# Patient Record
Sex: Male | Born: 1960 | Race: Black or African American | Hispanic: No | Marital: Single | Smoking: Never smoker
Health system: Southern US, Community
[De-identification: ages and names within clinical notes are randomized; demographics above are authoritative.]

## PROBLEM LIST (undated history)

## (undated) DIAGNOSIS — C801 Malignant (primary) neoplasm, unspecified: Secondary | ICD-10-CM

## (undated) DIAGNOSIS — R51 Headache: Secondary | ICD-10-CM

## (undated) DIAGNOSIS — K649 Unspecified hemorrhoids: Secondary | ICD-10-CM

## (undated) DIAGNOSIS — J329 Chronic sinusitis, unspecified: Secondary | ICD-10-CM

## (undated) DIAGNOSIS — F528 Other sexual dysfunction not due to a substance or known physiological condition: Secondary | ICD-10-CM

## (undated) DIAGNOSIS — G43909 Migraine, unspecified, not intractable, without status migrainosus: Secondary | ICD-10-CM

## (undated) DIAGNOSIS — M5430 Sciatica, unspecified side: Secondary | ICD-10-CM

## (undated) DIAGNOSIS — J45909 Unspecified asthma, uncomplicated: Secondary | ICD-10-CM

## (undated) DIAGNOSIS — J309 Allergic rhinitis, unspecified: Secondary | ICD-10-CM

## (undated) DIAGNOSIS — I10 Essential (primary) hypertension: Secondary | ICD-10-CM

## (undated) DIAGNOSIS — N410 Acute prostatitis: Secondary | ICD-10-CM

## (undated) DIAGNOSIS — E785 Hyperlipidemia, unspecified: Secondary | ICD-10-CM

## (undated) HISTORY — PX: WISDOM TOOTH EXTRACTION: SHX21

## (undated) HISTORY — DX: Allergic rhinitis, unspecified: J30.9

## (undated) HISTORY — DX: Migraine, unspecified, not intractable, without status migrainosus: G43.909

## (undated) HISTORY — DX: Other sexual dysfunction not due to a substance or known physiological condition: F52.8

## (undated) HISTORY — DX: Malignant (primary) neoplasm, unspecified: C80.1

## (undated) HISTORY — PX: COLONOSCOPY: SHX174

## (undated) HISTORY — DX: Hyperlipidemia, unspecified: E78.5

## (undated) HISTORY — DX: Unspecified hemorrhoids: K64.9

## (undated) HISTORY — DX: Chronic sinusitis, unspecified: J32.9

## (undated) HISTORY — DX: Unspecified asthma, uncomplicated: J45.909

## (undated) HISTORY — DX: Headache: R51

## (undated) HISTORY — DX: Sciatica, unspecified side: M54.30

## (undated) HISTORY — DX: Essential (primary) hypertension: I10

## (undated) HISTORY — DX: Acute prostatitis: N41.0

---

## 2004-10-16 ENCOUNTER — Ambulatory Visit: Payer: Self-pay | Admitting: Internal Medicine

## 2004-12-20 ENCOUNTER — Ambulatory Visit: Payer: Self-pay | Admitting: Internal Medicine

## 2005-02-08 ENCOUNTER — Ambulatory Visit: Payer: Self-pay | Admitting: Internal Medicine

## 2005-05-06 ENCOUNTER — Ambulatory Visit: Payer: Self-pay | Admitting: Internal Medicine

## 2005-05-10 ENCOUNTER — Ambulatory Visit: Payer: Self-pay | Admitting: Internal Medicine

## 2005-06-24 ENCOUNTER — Ambulatory Visit: Payer: Self-pay | Admitting: Internal Medicine

## 2005-07-02 ENCOUNTER — Ambulatory Visit: Payer: Self-pay | Admitting: Internal Medicine

## 2005-09-04 ENCOUNTER — Ambulatory Visit: Payer: Self-pay | Admitting: Internal Medicine

## 2005-09-26 ENCOUNTER — Ambulatory Visit: Payer: Self-pay | Admitting: Internal Medicine

## 2005-12-04 ENCOUNTER — Ambulatory Visit: Payer: Self-pay | Admitting: Internal Medicine

## 2006-05-02 ENCOUNTER — Ambulatory Visit: Payer: Self-pay | Admitting: Internal Medicine

## 2006-05-23 ENCOUNTER — Ambulatory Visit: Payer: Self-pay | Admitting: Internal Medicine

## 2006-05-23 LAB — CONVERTED CEMR LAB
AST: 31 units/L (ref 0–37)
Albumin: 3.7 g/dL (ref 3.5–5.2)
Alkaline Phosphatase: 48 units/L (ref 39–117)
BUN: 10 mg/dL (ref 6–23)
Basophils Absolute: 0 10*3/uL (ref 0.0–0.1)
Eosinophil percent: 2.1 % (ref 0.0–5.0)
GFR calc non Af Amer: 77 mL/min
HCT: 46.3 % (ref 39.0–52.0)
Hemoglobin: 16.1 g/dL (ref 13.0–17.0)
LDL DIRECT: 192.7 mg/dL
Monocytes Relative: 10.7 % (ref 3.0–11.0)
Neutrophils Relative %: 59.9 % (ref 43.0–77.0)
PSA: 0.47 ng/mL (ref 0.10–4.00)
Potassium: 3.9 meq/L (ref 3.5–5.1)
RDW: 12.8 % (ref 11.5–14.6)
Total Bilirubin: 1.1 mg/dL (ref 0.3–1.2)
Total Protein: 6.8 g/dL (ref 6.0–8.3)

## 2006-05-30 ENCOUNTER — Ambulatory Visit: Payer: Self-pay | Admitting: Internal Medicine

## 2006-07-07 ENCOUNTER — Ambulatory Visit: Payer: Self-pay | Admitting: Internal Medicine

## 2006-09-12 ENCOUNTER — Ambulatory Visit: Payer: Self-pay | Admitting: Internal Medicine

## 2006-09-22 ENCOUNTER — Ambulatory Visit: Payer: Self-pay | Admitting: Internal Medicine

## 2007-02-26 ENCOUNTER — Ambulatory Visit: Payer: Self-pay | Admitting: Internal Medicine

## 2007-02-26 DIAGNOSIS — J019 Acute sinusitis, unspecified: Secondary | ICD-10-CM | POA: Insufficient documentation

## 2007-02-26 DIAGNOSIS — E785 Hyperlipidemia, unspecified: Secondary | ICD-10-CM

## 2007-02-26 DIAGNOSIS — E782 Mixed hyperlipidemia: Secondary | ICD-10-CM | POA: Insufficient documentation

## 2007-02-26 DIAGNOSIS — J309 Allergic rhinitis, unspecified: Secondary | ICD-10-CM | POA: Insufficient documentation

## 2007-02-26 HISTORY — DX: Hyperlipidemia, unspecified: E78.5

## 2007-02-26 HISTORY — DX: Allergic rhinitis, unspecified: J30.9

## 2007-02-26 LAB — CONVERTED CEMR LAB: HDL goal, serum: 40 mg/dL

## 2007-04-09 ENCOUNTER — Telehealth: Payer: Self-pay | Admitting: Internal Medicine

## 2007-08-10 ENCOUNTER — Telehealth: Payer: Self-pay | Admitting: Internal Medicine

## 2007-09-21 ENCOUNTER — Telehealth: Payer: Self-pay | Admitting: Internal Medicine

## 2007-12-02 ENCOUNTER — Telehealth: Payer: Self-pay | Admitting: Internal Medicine

## 2007-12-02 ENCOUNTER — Ambulatory Visit: Payer: Self-pay | Admitting: Internal Medicine

## 2007-12-02 DIAGNOSIS — N410 Acute prostatitis: Secondary | ICD-10-CM

## 2007-12-02 DIAGNOSIS — R319 Hematuria, unspecified: Secondary | ICD-10-CM | POA: Insufficient documentation

## 2007-12-02 HISTORY — DX: Acute prostatitis: N41.0

## 2007-12-09 ENCOUNTER — Ambulatory Visit: Payer: Self-pay | Admitting: Internal Medicine

## 2008-03-01 ENCOUNTER — Telehealth: Payer: Self-pay | Admitting: Internal Medicine

## 2008-04-05 ENCOUNTER — Telehealth: Payer: Self-pay | Admitting: Internal Medicine

## 2008-04-06 ENCOUNTER — Ambulatory Visit: Payer: Self-pay | Admitting: Internal Medicine

## 2008-05-02 ENCOUNTER — Telehealth: Payer: Self-pay | Admitting: Internal Medicine

## 2008-05-04 ENCOUNTER — Ambulatory Visit: Payer: Self-pay | Admitting: Internal Medicine

## 2008-05-04 DIAGNOSIS — M5430 Sciatica, unspecified side: Secondary | ICD-10-CM

## 2008-05-04 HISTORY — DX: Sciatica, unspecified side: M54.30

## 2008-06-29 ENCOUNTER — Ambulatory Visit: Payer: Self-pay | Admitting: Internal Medicine

## 2008-06-29 LAB — CONVERTED CEMR LAB
Bilirubin Urine: NEGATIVE
Urobilinogen, UA: 1

## 2008-06-30 ENCOUNTER — Encounter: Payer: Self-pay | Admitting: Internal Medicine

## 2008-08-10 ENCOUNTER — Ambulatory Visit: Payer: Self-pay | Admitting: Internal Medicine

## 2008-08-10 DIAGNOSIS — H00019 Hordeolum externum unspecified eye, unspecified eyelid: Secondary | ICD-10-CM | POA: Insufficient documentation

## 2008-09-21 ENCOUNTER — Telehealth: Payer: Self-pay | Admitting: Internal Medicine

## 2008-09-29 ENCOUNTER — Telehealth: Payer: Self-pay | Admitting: Internal Medicine

## 2008-11-07 ENCOUNTER — Telehealth: Payer: Self-pay | Admitting: Internal Medicine

## 2008-11-08 ENCOUNTER — Ambulatory Visit: Payer: Self-pay | Admitting: Internal Medicine

## 2008-11-08 DIAGNOSIS — F528 Other sexual dysfunction not due to a substance or known physiological condition: Secondary | ICD-10-CM | POA: Insufficient documentation

## 2008-11-08 DIAGNOSIS — N529 Male erectile dysfunction, unspecified: Secondary | ICD-10-CM | POA: Insufficient documentation

## 2008-11-08 HISTORY — DX: Other sexual dysfunction not due to a substance or known physiological condition: F52.8

## 2009-02-21 ENCOUNTER — Telehealth: Payer: Self-pay | Admitting: Internal Medicine

## 2009-02-22 ENCOUNTER — Ambulatory Visit: Payer: Self-pay | Admitting: Internal Medicine

## 2009-06-28 ENCOUNTER — Ambulatory Visit: Payer: Self-pay | Admitting: Internal Medicine

## 2009-06-28 DIAGNOSIS — J0110 Acute frontal sinusitis, unspecified: Secondary | ICD-10-CM | POA: Insufficient documentation

## 2009-08-01 ENCOUNTER — Ambulatory Visit: Payer: Self-pay | Admitting: Internal Medicine

## 2010-01-31 ENCOUNTER — Ambulatory Visit: Payer: Self-pay | Admitting: Family Medicine

## 2010-02-28 ENCOUNTER — Ambulatory Visit: Payer: Self-pay | Admitting: Family Medicine

## 2010-02-28 DIAGNOSIS — J329 Chronic sinusitis, unspecified: Secondary | ICD-10-CM

## 2010-02-28 HISTORY — DX: Chronic sinusitis, unspecified: J32.9

## 2010-03-15 ENCOUNTER — Ambulatory Visit: Payer: Self-pay | Admitting: Cardiovascular Disease

## 2010-06-26 NOTE — Assessment & Plan Note (Signed)
Summary: ?sinus infection/headaches/cjr   Vital Signs:  Patient profile:   50 year old male Height:      75 inches Weight:      227 pounds BMI:     28.48 Temp:     98.0 degrees F oral Pulse rate:   72 / minute Resp:     14 per minute BP sitting:   110 / 70  (left arm)  Vitals Entered By: Willy Eddy, LPN (June 28, 2009 11:57 AM) CC: c/o sinusitis like sx, URI symptoms   CC:  c/o sinusitis like sx and URI symptoms.  History of Present Illness:  URI Symptoms      This is a 50 year old man who presents with URI symptoms.  The patient reports purulent nasal discharge and dry cough, but denies nasal congestion, sore throat, productive cough, earache, and sick contacts.  Associated symptoms include low-grade fever (<100.5 degrees).  The patient denies fever, fever of 100.5-103 degrees, fever of 103.1-104 degrees, fever to >104 degrees, stiff neck, dyspnea, wheezing, rash, vomiting, diarrhea, use of an antipyretic, and response to antipyretic.  The patient also reports headache, muscle aches, and severe fatigue.  The patient denies the following risk factors for Strep sinusitis: unilateral facial pain, unilateral nasal discharge, poor response to decongestant, double sickening, tooth pain, Strep exposure, tender adenopathy, and absence of cough.    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 Once Daily 2)  Zyrtec Allergy  10 Mg  Tabs (Cetirizine Hcl) .... Once Daily 3)  Nasonex 50 Mcg/act  Susp (Mometasone Furoate) .... Use As Directed 4)  Etodolac 300 Mg  Caps (Etodolac) .... Two Times A Day With Food 5)  Viagra 50 Mg Tabs (Sildenafil Citrate) .... Take As Directed 6)  Levaquin 750 Mg Tabs (Levofloxacin) .... One By Mouth Dialy For 7 Days  Current Medications (verified): 1)  Crestor 10 Mg  Tabs (Rosuvastatin Calcium) .... Once Daily 2)  Viagra 50 Mg Tabs (Sildenafil Citrate) .... Take As Directed 3)  Sulfamethoxazole-Tmp Ds 800-160 Mg Tabs (Sulfamethoxazole-Trimethoprim) .... One By Mouth  Two Times A Day 4)  Allerx-D 120-2.5 Mg Xr12h-Tab (Pseudoephedrine-Methscopolamin) .... One By Mouth Two Times A Day  Allergies (verified): No Known Drug Allergies  Past History:  Family History: Last updated: 12/02/2007 Family History Hypertension  Social History: Last updated: 02/26/2007 Married Never Smoked  Risk Factors: Smoking Status: never (06/28/2009)  Past medical, surgical, family and social histories (including risk factors) reviewed for relevance to current acute and chronic problems.  Past Medical History: Reviewed history from 02/26/2007 and no changes required. Allergic rhinitis Hyperlipidemia  Family History: Reviewed history from 12/02/2007 and no changes required. Family History Hypertension  Social History: Reviewed history from 02/26/2007 and no changes required. Married Never Smoked  Review of Systems       The patient complains of decreased hearing, hoarseness, chest pain, and prolonged cough.  The patient denies anorexia, fever, weight loss, weight gain, vision loss,  syncope, dyspnea on exertion, peripheral edema, headaches, hemoptysis, abdominal pain, melena, hematochezia, severe indigestion/heartburn, hematuria, incontinence, genital sores, muscle weakness, suspicious skin lesions, transient blindness, difficulty walking, depression, unusual weight change, abnormal bleeding,  enlarged lymph nodes, angioedema, breast masses, and testicular masses.    Physical Exam  General:  Well-developed,well-nourished,in no acute distress; alert,appropriate and cooperative throughout examination Ears:  R ear normal and L ear normal.   Nose:  L frontal sinus tenderness and L maxillary sinus tenderness.   Mouth:  pharyngeal exudate, posterior lymphoid hypertrophy, and postnasal drip.   Neck:  No deformities, masses, or tenderness noted. Lungs:  Normal respiratory effort, chest expands symmetrically. Lungs are clear to auscultation, no crackles or wheezes. Heart:  Normal rate and regular rhythm. S1 and S2 normal without gallop, murmur, click, rub or other extra sounds.   Impression & Recommendations:  Problem # 1:  ACUTE FRONTAL SINUSITIS (ICD-461.1)  The following medications were removed from the medication list:    Nasonex 50 Mcg/act Susp (Mometasone furoate) ..... Use as directed    Levaquin 750 Mg Tabs (Levofloxacin) ..... One by mouth dialy for 7 days His updated medication list for this problem includes:    Sulfamethoxazole-tmp Ds 800-160 Mg Tabs (Sulfamethoxazole-trimethoprim) ..... One by mouth  two times a day    Allerx-d 120-2.5 Mg Xr12h-tab (Pseudoephedrine-methscopolamin) ..... One by mouth two times a day  Instructed on treatment. Call if symptoms persist or worsen.   Complete Medication List: 1)  Crestor 10 Mg Tabs (Rosuvastatin calcium) .... Once daily 2)  Viagra 50 Mg Tabs (Sildenafil citrate) .... Take as directed 3)  Sulfamethoxazole-tmp Ds 800-160 Mg Tabs (Sulfamethoxazole-trimethoprim) .... One by mouth  two times a day 4)  Allerx-d 120-2.5 Mg Xr12h-tab (Pseudoephedrine-methscopolamin) .... One by mouth two times a day  Patient Instructions: 1)  Take your antibiotic as prescribed until ALL of it is gone, but stop if you develop a rash or swelling and contact our office as soon as possible. Prescriptions: ALLERX-D 120-2.5 MG XR12H-TAB  (PSEUDOEPHEDRINE-METHSCOPOLAMIN) one by mouth two times a day  #20 x 0   Entered and Authorized by:   Stacie Glaze MD   Signed by:   Stacie Glaze MD on 06/28/2009   Method used:   Electronically to        [facility] [phone]* (retail)       [address] [place], [place]  [address]       Ph: [phone] or [phone]       Fax: [phone]   RxID:   [id number] SULFAMETHOXAZOLE-TMP DS 800-160 MG TABS (SULFAMETHOXAZOLE-TRIMETHOPRIM) one by mouth  two times a day  #20 x 0   Entered and Authorized by:   Stacie Glaze MD   Signed by:   Stacie Glaze MD on 06/28/2009   Method used:   Electronically to        [facility] [phone]* (retail)       [address] [place], [place]  [address]       Ph: [phone] or [phone]       Fax: [phone]   RxID:   [id number]

## 2010-06-26 NOTE — Assessment & Plan Note (Signed)
Summary: CHRONIC SINUS PROBLEMS // RS   Vital Signs:  Patient profile:   50 year old male Height:      75 inches (190.50 cm) Weight:      234.31 pounds (106.50 kg) O2 Sat:      95 % on Room air Temp:     98.1 degrees F (36.72 degrees C) oral Pulse rate:   78 / minute BP sitting:   136 / 90  (left arm) Cuff size:   large  Vitals Entered By: Josph Macho RMA (February 28, 2010 3:38 PM)  O2 Flow:  Room air CC: Chronic sinus problems/ CF Is Patient Diabetic? No   History of Present Illness: patient is in today with recurrent sinusitis. He was seen in September for sinusitis placed on ciprofloxacin notes he got roughly 80% better on the antibiotic but he stopped the symptoms are worsening again he is dealing with increased nasal congestion green rhinorrhea, increased facial pressure and ear pressure. On review of his chart and in speaking to him he has roughly 2 intense sinus infections annually. As well as ongoing allergies. He has been pleased with the fluticasone does feel like that helped to a degree and takes his cetirizine daily at this point but still is struggling with recurrent sinus infection. He is complaining of persistent facial pressure and pressure especially behind his eyes left greater than right. No visual changes, no photophobia, no high-grade fevers or chills no chest pain, palpitations, shortness of breath, GI or GU complaints noted to  Current Medications (verified): 1)  Crestor 10 Mg  Tabs (Rosuvastatin Calcium) .... Once Daily 2)  Viagra 50 Mg Tabs (Sildenafil Citrate) .... Take As Directed 3)  Chlor-Trimeton 4 Mg Tabs (Chlorpheniramine Maleate) .... One By Mouth Two Times A Day 4)  Cipro 500 Mg Tabs (Ciprofloxacin Hcl) .Marland Kitchen.. 1 Tab By Mouth Two Times A Day X 14 Days 5)  Cetirizine Hcl 10 Mg Tabs (Cetirizine Hcl) .Marland Kitchen.. 1 Tab By Mouth Daily By Mouth Daily During Allergy Season 6)  Fluticasone Propionate 50 Mcg/act Susp (Fluticasone Propionate) .... 2 Sprays Each Nostril  Daily During Allergy Season 7)  Naprosyn 500 Mg Tabs (Naproxen) .Marland Kitchen.. 1 Tab By Mouth Two Times A Day As Needed Pain With Food  Allergies (verified): No Known Drug Allergies  Past History:  Past medical history reviewed for relevance to current acute and chronic problems. Social history (including risk factors) reviewed for relevance to current acute and chronic problems.  Past Medical History: Reviewed history from 02/26/2007 and no changes required. Allergic rhinitis Hyperlipidemia  Social History: Reviewed history from 02/26/2007 and no changes required. Married Never Smoked  Review of Systems      See HPI  Physical Exam  General:  Well-developed,well-nourished,in no acute distress; alert,appropriate and cooperative throughout examination Head:  Normocephalic and atraumatic without obvious abnormalities. No apparent alopecia or balding. Eyes:  No corneal or conjunctival inflammation noted. EOMI.  Nose:  mucosal erythema, mucosal edema, and deviated septum slight to right Mouth:  Oral mucosa and oropharynx without lesions or exudates.  Teeth in good repair. oropharynx mildly erythematous Neck:  No deformities, masses, or tenderness noted. Lungs:  Normal respiratory effort, chest expands symmetrically. Lungs are clear to auscultation, no crackles or wheezes. Heart:  Normal rate and regular rhythm. S1 and S2 normal without gallop, murmur, click, rub or other extra sounds. Abdomen:  Bowel sounds positive,abdomen soft and non-tender without masses, organomegaly or hernias noted. Extremities:  No clubbing, cyanosis, edema, or deformity noted  with normal full range of motion of all joints.   Cervical Nodes:  R anterior LN enlarged and L anterior LN enlarged.   Psych:  Cognition and judgment appear intact. Alert and cooperative with normal attention span and concentration. No apparent delusions, illusions, hallucinations   Impression & Recommendations:  Problem # 1:  SINUSITIS,  RECURRENT (ICD-473.9)  His updated medication list for this problem includes:    Cipro 500 Mg Tabs (Ciprofloxacin hcl) .Marland Kitchen... 1 tab by mouth two times a day x 14 days    Fluticasone Propionate 50 Mcg/act Susp (Fluticasone propionate) .Marland Kitchen... 2 sprays each nostril daily during allergy season    Cefdinir 300 Mg Caps (Cefdinir) .Marland Kitchen... 1 cap by mouth two times a day x 21 days At this point these are happening more frequently so will refer for CT sinuses and ENT evaluation, appears to have a deviated septum  Problem # 2:  ALLERGIC RHINITIS (ICD-477.9)  His updated medication list for this problem includes:    Chlor-trimeton 4 Mg Tabs (Chlorpheniramine maleate) ..... One by mouth two times a day    Cetirizine Hcl 10 Mg Tabs (Cetirizine hcl) .Marland Kitchen... 1 tab by mouth daily by mouth daily during allergy season    Fluticasone Propionate 50 Mcg/act Susp (Fluticasone propionate) .Marland Kitchen... 2 sprays each nostril daily during allergy season  Orders: ENT Referral (ENT) Take meds daily until seen by ENT  Complete Medication List: 1)  Crestor 10 Mg Tabs (Rosuvastatin calcium) .... Once daily 2)  Viagra 50 Mg Tabs (Sildenafil citrate) .... Take as directed, 1 tab by mouth once daily as needed ed 3)  Chlor-trimeton 4 Mg Tabs (Chlorpheniramine maleate) .... One by mouth two times a day 4)  Cipro 500 Mg Tabs (Ciprofloxacin hcl) .Marland Kitchen.. 1 tab by mouth two times a day x 14 days 5)  Cetirizine Hcl 10 Mg Tabs (Cetirizine hcl) .Marland Kitchen.. 1 tab by mouth daily by mouth daily during allergy season 6)  Fluticasone Propionate 50 Mcg/act Susp (Fluticasone propionate) .... 2 sprays each nostril daily during allergy season 7)  Naprosyn 500 Mg Tabs (Naproxen) .Marland Kitchen.. 1 tab by mouth two times a day as needed pain with food 8)  Cefdinir 300 Mg Caps (Cefdinir) .Marland Kitchen.. 1 cap by mouth two times a day x 21 days 9)  Align Caps (Probiotic product) .Marland Kitchen.. 1 cap by mouth daily 10)  Medrol (pak) 4 Mg Tabs (Methylprednisolone) .... Use pack by mouth as directed  on package  Other Orders: Radiology Referral (Radiology)  Patient Instructions: 1)  Take your antibiotic as prescribed until ALL of it is gone, but stop if you develop a rash or swelling and contact our office as soon as possible.  2)  Acute sinusitis symptoms for less than 10 days are not helped by antibiotics. Use warm moist compresses, and over the counter decongestants( only as directed). Call if no improvement in 5-7 days, sooner if increasing pain, fever, or new symptoms.  3)  Take a probiotic and eat a yogurt daily whenever you take an antibiotic Prescriptions: VIAGRA 50 MG TABS (SILDENAFIL CITRATE) Take as directed, 1 tab by mouth once daily as needed ED  #9 x 1   Entered and Authorized by:   Danise Edge MD   Signed by:   Danise Edge MD on 02/28/2010   Method used:   Print then Give to Patient   RxID:   [id number] MEDROL (PAK) 4 MG TABS (METHYLPREDNISOLONE) use pack by mouth as directed on package  #1 pack x 0  Entered and Authorized by:   Danise Edge MD   Signed by:   Danise Edge MD on 02/28/2010   Method used:   Electronically to        [facility] [phone]* (retail)       [address] [place], [place]  [address]       Ph: [phone] or [phone]       Fax: [phone]   RxID:   [id number] CEFDINIR 300 MG CAPS (CEFDINIR) 1 cap by mouth two times a day x 21 days  #42 x 0   Entered and Authorized by:   Danise Edge MD   Signed by:   Danise Edge MD on 02/28/2010   Method used:   Electronically to        [facility] [phone]* (retail)       [address] [place], [place]  [address]       Ph: [phone] or [phone]       Fax: [phone]   RxID:   [id number]

## 2010-06-26 NOTE — Assessment & Plan Note (Signed)
Summary: ?sinus inf/njr   Vital Signs:  Patient profile:   50 year old male Height:      75 inches (190.50 cm) Weight:      233 pounds (105.91 kg) O2 Sat:      98 % on Room air Temp:     97.8 degrees F (36.56 degrees C) oral Pulse rate:   88 / minute BP sitting:   124 / 90  (left arm) Cuff size:   large  Vitals Entered By: Josph Macho RMA (January 31, 2010 2:59 PM)  O2 Flow:  Room air CC: Possible sinus infection- headaches across eyes, drainage in back of throat X2 days/ CF Is Patient Diabetic? No   History of Present Illness: Patient in today for evaluation of recurrent sinusitis. He has had frontal HA/nasal congestion/watery and itchy eyes. He has also noted some mild nausea and one episode of vomitting. He denies any f/c/myalgias/diarrhea/constipation/anorexia/CP/palp/SOB/cough. He has been taking some Excedrine as needed for the HA and it helps temporarily. He is not experiencing rhinorrhea but does note some PND. The one time he vomitted he felt it was mostly sputum and it did have some small, blood flecks. He denies any heartburn or dyspepsia. Looking through his chart it appears he gets frequent sinus infections in the fall and spring. He acknowledges he feels his allergies flare during those seasons and then he picks up a sinus infection.  Current Medications (verified): 1)  Crestor 10 Mg  Tabs (Rosuvastatin Calcium) .... Once Daily 2)  Viagra 50 Mg Tabs (Sildenafil Citrate) .... Take As Directed 3)  Clarithromycin 500 Mg Xr24h-Tab (Clarithromycin) .... One By Mouth Two Times A Day 4)  Chlor-Trimeton 4 Mg Tabs (Chlorpheniramine Maleate) .... One By Mouth Two Times A Day  Allergies (verified): No Known Drug Allergies  Past History:  Past medical history reviewed for relevance to current acute and chronic problems. Social history (including risk factors) reviewed for relevance to current acute and chronic problems.  Past Medical History: Reviewed history from  02/26/2007 and no changes required. Allergic rhinitis Hyperlipidemia  Social History: Reviewed history from 02/26/2007 and no changes required. Married Never Smoked  Review of Systems      See HPI  Physical Exam  General:  Well-developed,well-nourished,in no acute distress; alert,appropriate and cooperative throughout examination Head:  Normocephalic and atraumatic without obvious abnormalities. No apparent alopecia or balding. Eyes:  conjunctival injection.   Ears:  L ear normal and R TM erythema, mild Nose:  nasal mucosa boggy and erythematous b/l. Right nares nearly occluded Mouth:  Oropharynx erythematous, no edema. MMM Neck:  No deformities, masses, or tenderness noted. Lungs:  Normal respiratory effort, chest expands symmetrically. Lungs are clear to auscultation, no crackles or wheezes. Heart:  Normal rate and regular rhythm. S1 and S2 normal without gallop, murmur, click, rub or other extra sounds. Abdomen:  Bowel sounds positive,abdomen soft and non-tender without masses, organomegaly or hernias noted. Extremities:  No clubbing, cyanosis, edema, or deformity noted  Psych:  Cognition and judgment appear intact. Alert and cooperative with normal attention span and concentration. No apparent delusions, illusions, hallucinations   Impression & Recommendations:  Problem # 1:  ACUTE FRONTAL SINUSITIS (ICD-461.1)  The following medications were removed from the medication list:    Clarithromycin 500 Mg Xr24h-tab (Clarithromycin) ..... One by mouth two times a day His updated medication list for this problem includes:    Cipro 500 Mg Tabs (Ciprofloxacin hcl) .Marland Kitchen... 1 tab by mouth two times a day x  14 days    Fluticasone Propionate 50 Mcg/act Susp (Fluticasone propionate) .Marland Kitchen... 2 sprays each nostril daily during allergy season, given sample os Nasonex to try first. Will stay on which ever one he finds most helpful. Recommend to take a nasal steroid and an antihistamine daily during  the early spring and fall each year to see if he can decrease his episodes of sinusitis. If this is ineffective he may need a referral to ENT for further evaluation  Problem # 2:  ALLERGIC RHINITIS (ICD-477.9)  His updated medication list for this problem includes:    Chlor-trimeton 4 Mg Tabs (Chlorpheniramine maleate) ..... One by mouth two times a day    Cetirizine Hcl 10 Mg Tabs (Cetirizine hcl) .Marland Kitchen... 1 tab by mouth daily by mouth daily during allergy season    Fluticasone Propionate 50 Mcg/act Susp (Fluticasone propionate) .Marland Kitchen... 2 sprays each nostril daily during allergy season  Complete Medication List: 1)  Crestor 10 Mg Tabs (Rosuvastatin calcium) .... Once daily 2)  Viagra 50 Mg Tabs (Sildenafil citrate) .... Take as directed 3)  Chlor-trimeton 4 Mg Tabs (Chlorpheniramine maleate) .... One by mouth two times a day 4)  Cipro 500 Mg Tabs (Ciprofloxacin hcl) .Marland Kitchen.. 1 tab by mouth two times a day x 14 days 5)  Cetirizine Hcl 10 Mg Tabs (Cetirizine hcl) .Marland Kitchen.. 1 tab by mouth daily by mouth daily during allergy season 6)  Fluticasone Propionate 50 Mcg/act Susp (Fluticasone propionate) .... 2 sprays each nostril daily during allergy season 7)  Naprosyn 500 Mg Tabs (Naproxen) .Marland Kitchen.. 1 tab by mouth two times a day as needed pain with food  Patient Instructions: 1)  Take your antibiotic as prescribed until ALL of it is gone, but stop if you develop a rash or swelling and contact our office as soon as possible.  2)  Acute sinusitis symptoms for less than 10 days are not helped by antibiotics. Use warm moist compresses, and over the counter decongestants( only as directed). Call if no improvement in 5-7 days, sooner if increasing pain, fever, or new symptoms.  3)  Please schedule a follow-up appointment as needed if symptoms worsen or do not improve 4)  Use sample os Nasonex 2 sprays each nostril daily til gone is helpful p/u fluticasone and use 2 sprays daily you will stay on which ever one you like  better daily during allergy season. If you like the Nasonex better we can call in an rx Prescriptions: NAPROSYN 500 MG TABS (NAPROXEN) 1 tab by mouth two times a day as needed pain with food  #60 x 3   Entered and Authorized by:   Danise Edge MD   Signed by:   Danise Edge MD on 01/31/2010   Method used:   Electronically to        [facility] [phone]* (retail)       [address] [place], [place]  [address]       Ph: [phone] or [phone]       Fax: [phone]   RxID:   [id number] FLUTICASONE PROPIONATE 50 MCG/ACT SUSP (FLUTICASONE PROPIONATE) 2 sprays each nostril daily during allergy season  #1bottle x 3   Entered and Authorized by:   Danise Edge MD   Signed by:   Danise Edge MD on 01/31/2010   Method used:   Electronically to        [facility] [phone]* (retail)  [address] [place], [place]  [address]       Ph: [phone] or [phone]       Fax: [phone]   RxID:   [id number] CETIRIZINE HCL 10 MG TABS (CETIRIZINE HCL) 1 tab by mouth daily by mouth daily during allergy season  #30 x 3   Entered and Authorized by:   Danise Edge MD   Signed by:   Danise Edge MD on 01/31/2010   Method used:   Electronically to        [facility] [phone]* (retail)       [address] [place], [place]  [address]       Ph: [phone] or [phone]       Fax: [phone]   RxID:   [id number] CIPRO 500 MG TABS (CIPROFLOXACIN HCL) 1 tab by mouth two times a day x 14 days  #28 x 0   Entered and Authorized by:   Danise Edge MD   Signed by:   Danise Edge MD on 01/31/2010   Method used:   Electronically to        [facility] [phone]* (retail)       [address] [place], [place]  [address]       Ph: [phone] or [phone]       Fax: [phone]   RxID:   [id number]

## 2010-06-26 NOTE — Assessment & Plan Note (Signed)
Summary: sore throat/dm   Vital Signs:  Patient profile:   50 year old male Height:      75 inches Weight:      228 pounds BMI:     28.60 Temp:     98.2 degrees F oral Pulse rate:   76 / minute Resp:     14 per minute BP sitting:   120 / 80  (left arm)  Vitals Entered By: Willy Eddy, LPN (August 01, 1608 11:45 AM) CC: c/o sore throat,nasal congestion and cough for 4 days--started taking bactrim ds that he had   CC:  c/o sore throat and nasal congestion and cough for 4 days--started taking bactrim ds that he had.  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 Once Daily 2)  Viagra 50 Mg Tabs (Sildenafil Citrate) .... Take As Directed 3)  Sulfamethoxazole-Tmp Ds 800-160 Mg Tabs (Sulfamethoxazole-Trimethoprim) .... One By Mouth  Two Times A Day 4)  Allerx-D 120-2.5 Mg Xr12h-Tab (Pseudoephedrine-Methscopolamin) .... One By Mouth Two Times A Day  Current Medications (verified): 1)  Crestor 10 Mg  Tabs (Rosuvastatin Calcium) .... Once Daily 2)  Viagra 50 Mg Tabs  (Sildenafil Citrate) .... Take As Directed 3)  Sulfamethoxazole-Tmp Ds 800-160 Mg Tabs (Sulfamethoxazole-Trimethoprim) .... One By Mouth  Two Times A Day 4)  Allerx-D 120-2.5 Mg Xr12h-Tab (Pseudoephedrine-Methscopolamin) .... One By Mouth Two Times A Day  Allergies (verified): No Known Drug Allergies  Past History:  Family History: Last updated: 12/02/2007 Family History Hypertension  Social History: Last updated: 02/26/2007 Married Never Smoked  Risk Factors: Smoking Status: never (08/01/2009)  Past medical, surgical, family and social histories (including risk factors) reviewed, and no changes noted (except as noted below).  Past Medical History: Reviewed history from 02/26/2007 and no changes required. Allergic rhinitis Hyperlipidemia  Family History: Reviewed history from 12/02/2007 and no changes required. Family History Hypertension  Social History: Reviewed history from 02/26/2007 and no changes required. Married Never Smoked  Review of Systems  The patient denies anorexia, fever, weight loss, weight gain, vision loss, decreased hearing, hoarseness, chest pain, syncope, dyspnea on exertion, peripheral edema, prolonged cough, headaches, hemoptysis, abdominal pain, melena, hematochezia, severe indigestion/heartburn, hematuria, incontinence, genital sores, muscle weakness, suspicious skin lesions, transient blindness, difficulty walking, depression, unusual weight change, abnormal bleeding, enlarged lymph nodes, angioedema, and breast masses.    Physical Exam  General:  Well-developed,well-nourished,in no acute distress; alert,appropriate and cooperative throughout examination Ears:  R ear normal and L ear normal.   Nose:  L frontal sinus tenderness and L maxillary sinus tenderness.   Mouth:  pharyngeal exudate, posterior lymphoid hypertrophy, and postnasal drip.   Neck:  No deformities, masses, or tenderness noted. Lungs:  Normal respiratory effort, chest expands  symmetrically. Lungs are clear to auscultation, no crackles or wheezes. Heart:  Normal rate and regular rhythm. S1 and S2 normal without gallop, murmur, click, rub or other extra sounds. Abdomen:  no flank inpact pain Msk:  No deformity or scoliosis noted of thoracic or lumbar spine.   Extremities:  No clubbing, cyanosis, edema, or deformity noted with normal full range of motion of all joints.   Neurologic:  No cranial nerve deficits noted. Station and gait are normal. Plantar reflexes are down-going bilaterally. DTRs are symmetrical throughout. Sensory, motor and coordinative functions appear intact.   Impression & Recommendations:  Problem # 1:  ACUTE FRONTAL SINUSITIS (ICD-461.1)  recurrent did not finish antibotic The following medications were removed from the medication list:  completed in 06/2009    Sulfamethoxazole-tmp Ds 800-160 Mg Tabs (Sulfamethoxazole-trimethoprim) ..... One by mouth  two times a day    Allerx-d 120-2.5 Mg Xr12h-tab (Pseudoephedrine-methscopolamin) ..... One by mouth two times a day  The following medications were removed from the medication list:    Sulfamethoxazole-tmp Ds 800-160 Mg Tabs (Sulfamethoxazole-trimethoprim) ..... One by mouth  two times a day    Allerx-d 120-2.5 Mg Xr12h-tab (Pseudoephedrine-methscopolamin) ..... One by mouth two times a day His updated medication list for this problem includes:    Clarithromycin 500 Mg Xr24h-tab (Clarithromycin) ..... One by mouth two times a day  Instructed on treatment. Call if symptoms persist or worsen.   Problem # 2:  ALLERGIC RHINITIS (ICD-477.9)  His updated medication list for this problem includes:    Chlor-trimeton 4 Mg Tabs (Chlorpheniramine maleate) ..... One by mouth two times a day  Discussed use of allergy medications and environmental measures.   Complete Medication List: 1)  Crestor 10 Mg Tabs (Rosuvastatin calcium) .... Once daily 2)  Viagra 50 Mg Tabs (Sildenafil citrate) .... Take as  directed 3)  Clarithromycin 500 Mg Xr24h-tab (Clarithromycin) .... One by mouth two times a day 4)  Chlor-trimeton 4 Mg Tabs (Chlorpheniramine maleate) .... One by mouth two times a day  Patient Instructions: 1)  Take your antibiotic as prescribed until ALL of it is gone, but stop if you develop a rash or swelling and contact our office as soon as possible. Prescriptions: CLARITHROMYCIN 500 MG XR24H-TAB (CLARITHROMYCIN) one by mouth two times a day  #14 x 0   Entered and Authorized by:   Stacie Glaze MD   Signed by:   Stacie Glaze MD on 08/01/2009   Method used:   Electronically to        [facility] [id number]* (retail)       [address] [place], [place]  [address]       Ph: [phone] or [phone]       Fax: [phone]   RxID:   [id number]

## 2010-07-03 ENCOUNTER — Ambulatory Visit (INDEPENDENT_AMBULATORY_CARE_PROVIDER_SITE_OTHER): Payer: Managed Care, Other (non HMO) | Admitting: Internal Medicine

## 2010-07-03 ENCOUNTER — Encounter: Payer: Self-pay | Admitting: Internal Medicine

## 2010-07-03 DIAGNOSIS — R51 Headache: Secondary | ICD-10-CM

## 2010-07-03 DIAGNOSIS — J329 Chronic sinusitis, unspecified: Secondary | ICD-10-CM

## 2010-07-03 DIAGNOSIS — R519 Headache, unspecified: Secondary | ICD-10-CM | POA: Insufficient documentation

## 2010-07-03 HISTORY — DX: Headache, unspecified: R51.9

## 2010-07-03 MED ORDER — HYDROCODONE-ACETAMINOPHEN 5-325 MG PO TABS: 1.0000 | ORAL_TABLET | ORAL | Status: DC | PRN

## 2010-07-03 MED ORDER — LEVOFLOXACIN 500 MG PO TABS: 500.0000 mg | ORAL_TABLET | Freq: Every day | ORAL | Status: AC

## 2010-07-03 MED ORDER — PHENYLEPHRINE-DM-GG-APAP 5-10-200-325 MG/10ML PO LIQD: 5.0000 mL | Freq: Four times a day (QID) | ORAL | Status: DC | PRN

## 2010-07-03 NOTE — Progress Notes (Signed)
Subjective:    Patient ID: Gerald Johnson, male    DOB: 07-Aug-1960, 50 y.o.   MRN: [id number]  Sinusitis This is a recurrent problem. The current episode started in the past 7 days. The problem has been gradually worsening since onset. There has been no fever. His pain is at a severity of 8/10. The pain is severe. Associated symptoms include headaches, sinus pressure, a sore throat and swollen glands. Pertinent negatives include no congestion, coughing, neck pain or shortness of breath. Past treatments include acetaminophen and oral decongestants. The treatment provided no relief.  Headache  This is a new problem. The current episode started yesterday. The problem occurs constantly. The problem has been gradually worsening. The pain is located in the retro-orbital region. The pain does not radiate. The pain quality is not similar to prior headaches. The quality of the pain is described as sharp and pulsating. The pain is at a severity of 9/10. The pain is severe. Associated symptoms include sinus pressure, a sore throat and swollen glands. Pertinent negatives include no abdominal pain, coughing, eye redness, fever, hearing loss, neck pain or weakness. The symptoms are aggravated by sneezing and activity. He has tried acetaminophen and NSAIDs for the symptoms. The treatment provided mild relief. His past medical history is significant for sinus disease.      Review of Systems  Constitutional: Negative for fever and fatigue.  HENT: Positive for sore throat and sinus pressure. Negative for hearing loss, congestion, neck pain and postnasal drip.   Eyes: Negative for discharge, redness and visual disturbance.  Respiratory: Negative for cough, shortness of breath and wheezing.   Cardiovascular: Negative for leg swelling.  Gastrointestinal: Negative for abdominal pain, constipation and abdominal distention.  Genitourinary: Negative for urgency and frequency.  Musculoskeletal: Negative for joint swelling  and arthralgias.  Skin: Negative for color change and rash.  Neurological: Positive for headaches. Negative for weakness and light-headedness.  Hematological: Negative for adenopathy.  Psychiatric/Behavioral: Negative for behavioral problems.       Objective:   Physical Exam  Constitutional: He appears well-developed and well-nourished.       The patient was in apparent pain with tenderness over his frontal sinuses bilaterally left greater than right  HENT:  Head: Normocephalic and atraumatic.       Nasal turbinates were swollen bilaterally with airflow obstruction oropharynx has posterior cobblestoning with postnasal drip there was purulent mucosal drainage detected.  Eyes: Conjunctivae are normal. Pupils are equal, round, and reactive to light.  Neck: Normal range of motion. Neck supple.  Cardiovascular: Normal rate and regular rhythm.   Pulmonary/Chest: Effort normal and breath sounds normal.  Abdominal: Soft. Bowel sounds are normal.  Skin: Skin is warm and dry.          Assessment & Plan:  1. Acute sinusitis the patient will be placed on Levaquin 500 milligrams by mouth daily for 10 days and was given a prescription for Mucinex past Max cold and flu to use 4 times a day as needed. Should his symptoms persist he was instructed to call back for sinus CT and consideration for referral to an ear nose and throat physician.  2. Headache the patient's new onset headache is different from any prior headache it is enough frontal sinus location it is positional and worsened when he bends over it is related to the intensity of the sinus infection.  The ultimate treatment will be treatment of the underlying disorder which is the sinusitis but in the interim he  is given a prescription for Vicodin #20 one by mouth every 4-6 hours when necessary severe headache no refills will be given.  The patient was instructed on care for sinusitis use of nasal lavage avoidance of afrin. I told to contact  our office should he not see improving symptoms in the next 48-72 hour period

## 2010-10-01 ENCOUNTER — Ambulatory Visit (INDEPENDENT_AMBULATORY_CARE_PROVIDER_SITE_OTHER): Payer: Managed Care, Other (non HMO) | Admitting: Internal Medicine

## 2010-10-01 ENCOUNTER — Encounter: Payer: Self-pay | Admitting: Internal Medicine

## 2010-10-01 DIAGNOSIS — R51 Headache: Secondary | ICD-10-CM

## 2010-10-01 DIAGNOSIS — J329 Chronic sinusitis, unspecified: Secondary | ICD-10-CM

## 2010-10-01 MED ORDER — KETOROLAC TROMETHAMINE 30 MG/ML IJ SOLN
30.0000 mg | Freq: Once | INTRAMUSCULAR | Status: AC
  Administered 2010-10-01: 30 mg via INTRAMUSCULAR

## 2010-10-01 MED ORDER — LEVOFLOXACIN 500 MG PO TABS: 500.0000 mg | ORAL_TABLET | Freq: Every day | ORAL | Status: AC

## 2010-10-01 NOTE — Progress Notes (Signed)
  Subjective:    Patient ID: Gerald Johnson, male    DOB: 12-04-1960, 50 y.o.   MRN: [id number]  HPI Pt presents to clinic for evaluation of sinusitis. Notes approximately one-week history of left retro-orbital pain and pressure. Denies fever chills or tooth pain. Does have nasal congestion and drainage. Symptoms consistent with previous episodes of sinusitis responsive to antibiotic therapy. Taking no medication for the current problem. No alleviating or exacerbating factors. Has had episodes of suspected sinusitis three times in the past six months.  Reviewed past medical history, medications and allergies.  Review of Systems see history of present illness     Objective:   Physical Exam    Physical Exam  [nursing notereviewed. Constitutional:  appears well-developed and well-nourished. No distress.  HENT: No frontal or maxillary sinus tenderness. Mild discomfort palpation along the left upper paranasal sinus area Head: Normocephalic and atraumatic.  Right Ear: Tympanic membrane, external ear and ear canal normal.  Left Ear: Tympanic membrane, external ear and ear canal normal.  Nose: Nose normal.  Mouth/Throat: Oropharynx is clear and moist. No oropharyngeal exudate.  Eyes: Conjunctivae are normal. No scleral icterus.  Neck: Neck supple.  Cardiovascular: Normal rate, regular rhythm and normal heart sounds.  Exam reveals no gallop and no friction rub.   No murmur heard. Pulmonary/Chest: Effort normal and breath sounds normal. No respiratory distress.  no wheezes.  no rales.  Lymphadenopathy:     no cervical adenopathy.  Neurological:  alert.  Skin: Skin is warm and dry.  not diaphoretic.      Assessment & Plan:

## 2010-10-01 NOTE — Assessment & Plan Note (Signed)
Begin antibiotic therapy with Levaquin x10 days. Toradol 30 mg IM today. ENT consult.

## 2010-10-18 ENCOUNTER — Ambulatory Visit (INDEPENDENT_AMBULATORY_CARE_PROVIDER_SITE_OTHER): Payer: Managed Care, Other (non HMO) | Admitting: Internal Medicine

## 2010-10-18 ENCOUNTER — Encounter: Payer: Self-pay | Admitting: Internal Medicine

## 2010-10-18 DIAGNOSIS — J329 Chronic sinusitis, unspecified: Secondary | ICD-10-CM

## 2010-10-18 DIAGNOSIS — J309 Allergic rhinitis, unspecified: Secondary | ICD-10-CM

## 2010-10-18 DIAGNOSIS — J0110 Acute frontal sinusitis, unspecified: Secondary | ICD-10-CM

## 2010-10-18 DIAGNOSIS — R519 Headache, unspecified: Secondary | ICD-10-CM

## 2010-10-18 DIAGNOSIS — R51 Headache: Secondary | ICD-10-CM

## 2010-10-18 MED ORDER — FLUTICASONE FUROATE 27.5 MCG/SPRAY NA SUSP: 2.0000 | Freq: Every day | NASAL | Status: DC

## 2010-10-18 MED ORDER — HYDROCODONE-ACETAMINOPHEN 5-325 MG PO TABS: 1.0000 | ORAL_TABLET | ORAL | Status: AC | PRN

## 2010-10-18 MED ORDER — AMOXICILLIN-POT CLAVULANATE 875-125 MG PO TABS: 1.0000 | ORAL_TABLET | Freq: Two times a day (BID) | ORAL | Status: AC

## 2010-10-18 NOTE — Patient Instructions (Addendum)
Use Afrin nasal spray twice daily for 5 days Take your antibiotic as prescribed until ALL of it is gone, but stop if you develop a rash, swelling, or any side effects of the medication.  Contact our office as soon as possible if  there are side effects of the medication.  Sinus CT scan as discussed  Mucinex D  twice daily  Call or return to clinic prn if these symptoms worsen or fail to improve as anticipated.

## 2010-10-18 NOTE — Progress Notes (Signed)
  Subjective:    Patient ID: Gerald Johnson, male    DOB: Jul 17, 1960, 50 y.o.   MRN: [id number]  HPI  50 year old patient who presents with a three-day history of retro-orbital headaches pain is maximum behind the right eye. He was seen earlier in the month and treated for suspected recurrent sinusitis and did improve after 10 days of Levaquin antibiotic therapy. He was also treated earlier in February for very similar symptoms that responded to antibiotic therapy at that time. He describes some sinus fullness and some mild postnasal drip. Denies any fever or chills. He does have a history of allergic rhinitis;  he has been prescribed fluticasone the past but apparently is not taking at this time.   Review of Systems  Constitutional: Negative for fever, chills, appetite change and fatigue.  HENT: Positive for postnasal drip. Negative for hearing loss, ear pain, congestion, sore throat, trouble swallowing, neck stiffness, dental problem, voice change and tinnitus.   Eyes: Negative for photophobia, pain, discharge, redness and visual disturbance.  Respiratory: Negative for cough, chest tightness, wheezing and stridor.   Cardiovascular: Negative for chest pain, palpitations and leg swelling.  Gastrointestinal: Negative for nausea, vomiting, abdominal pain, diarrhea, constipation, blood in stool and abdominal distention.  Genitourinary: Negative for urgency, hematuria, flank pain, discharge, difficulty urinating and genital sores.  Musculoskeletal: Negative for myalgias, back pain, joint swelling, arthralgias and gait problem.  Skin: Negative for rash.  Neurological: Positive for headaches. Negative for dizziness, syncope, speech difficulty, weakness and numbness.  Hematological: Negative for adenopathy. Does not bruise/bleed easily.  Psychiatric/Behavioral: Negative for behavioral problems and dysphoric mood. The patient is not nervous/anxious.        Objective:   Physical Exam  Constitutional:  He is oriented to person, place, and time. He appears well-developed.       No distress. Afebrile. Normal blood pressure  HENT:  Head: Normocephalic.  Right Ear: External ear normal.  Left Ear: External ear normal.       No tenderness in the frontal sinuses or maxillary sinus areas; He did have some significant nasal mucosal edematous  Changes especially on the right  Eyes: Conjunctivae and EOM are normal.  Neck: Normal range of motion.  Cardiovascular: Normal rate and normal heart sounds.   Pulmonary/Chest: Breath sounds normal.  Abdominal: Bowel sounds are normal.  Musculoskeletal: Normal range of motion. He exhibits no edema and no tenderness.  Neurological: He is alert and oriented to person, place, and time.  Psychiatric: He has a normal mood and affect. His behavior is normal.          Assessment & Plan:   Probable recurrence sinusitis. He has had 2 prior episodes this year both of which have responded to antibiotic therapy. Due to the severity of his symptoms and chronicity we'll proceed with a sinus CT scan and consider ENT referral if significant sinus disease is demonstrated. Will retreat with antibiotic therapy

## 2010-10-29 ENCOUNTER — Other Ambulatory Visit: Payer: Managed Care, Other (non HMO)

## 2010-11-09 ENCOUNTER — Telehealth: Payer: Self-pay | Admitting: Internal Medicine

## 2010-11-09 NOTE — Telephone Encounter (Signed)
Can we do this ?

## 2010-11-09 NOTE — Telephone Encounter (Signed)
Pt came in on 10/03/10 and May 23rd for foot pain. Pt gave an old insurance card that said no referral was needed. Pt does need a retroactive referral done for these visits. Heel pain, dx Plantar Fasciitis. Fax # [phone]

## 2010-11-22 ENCOUNTER — Other Ambulatory Visit: Payer: Self-pay | Admitting: Internal Medicine

## 2010-11-22 DIAGNOSIS — M722 Plantar fascial fibromatosis: Secondary | ICD-10-CM

## 2011-01-23 ENCOUNTER — Ambulatory Visit (INDEPENDENT_AMBULATORY_CARE_PROVIDER_SITE_OTHER): Payer: Managed Care, Other (non HMO) | Admitting: Family Medicine

## 2011-01-23 ENCOUNTER — Encounter: Payer: Self-pay | Admitting: Family Medicine

## 2011-01-23 VITALS — Temp 98.3°F | Resp 12 | Wt 235.0 lb

## 2011-01-23 DIAGNOSIS — J069 Acute upper respiratory infection, unspecified: Secondary | ICD-10-CM

## 2011-01-23 NOTE — Patient Instructions (Signed)
Try Allegra D over the counter Try to get plenty of fluids and rest.

## 2011-01-23 NOTE — Progress Notes (Signed)
  Subjective:    Patient ID: Gerald Johnson, male    DOB: 1960-12-21, 50 y.o.   MRN: [id number]  HPI 3 to four-day history of sinus congestion. Clear rhinorrhea. Intermittent headaches. Increased malaise. Diffuse body aches. Patient denies any fever, chills, sore throat, earache, cough, nausea, vomiting, or diarrhea. No ill exposures. He has not tried any over-the-counter medications. Past medical history is reviewed. He has history of hyperlipidemia. Previously prescribed Crestor but not taking.   Review of Systems  Constitutional: Negative for fever, chills and unexpected weight change.  HENT: Positive for congestion and sinus pressure. Negative for ear pain and sore throat.   Respiratory: Negative for cough and wheezing.        Objective:   Physical Exam  Constitutional: He is oriented to person, place, and time. He appears well-developed and well-nourished.  HENT:  Right Ear: External ear normal.  Left Ear: External ear normal.  Mouth/Throat: Oropharynx is clear and moist.       Clear rhinorrhea  Neck: Neck supple.  Cardiovascular: Normal rate, regular rhythm and normal heart sounds.   Pulmonary/Chest: Effort normal and breath sounds normal. No respiratory distress. He has no wheezes. He has no rales.  Musculoskeletal: He exhibits no edema.  Lymphadenopathy:    He has no cervical adenopathy.  Neurological: He is alert and oriented to person, place, and time. No cranial nerve deficit.  Psychiatric: He has a normal mood and affect. His behavior is normal.          Assessment & Plan:  Probable viral URI. Try over-the-counter Allegra-D. Plenty of fluids and rest. Follow up as needed

## 2011-01-24 ENCOUNTER — Encounter: Payer: Self-pay | Admitting: Family Medicine

## 2011-05-06 ENCOUNTER — Encounter: Payer: Self-pay | Admitting: Family Medicine

## 2011-05-06 ENCOUNTER — Ambulatory Visit (INDEPENDENT_AMBULATORY_CARE_PROVIDER_SITE_OTHER): Payer: Managed Care, Other (non HMO) | Admitting: Family Medicine

## 2011-05-06 VITALS — BP 148/88 | HR 122 | Temp 98.7°F | Wt 240.0 lb

## 2011-05-06 DIAGNOSIS — J329 Chronic sinusitis, unspecified: Secondary | ICD-10-CM

## 2011-05-06 MED ORDER — HYDROCODONE-HOMATROPINE 5-1.5 MG/5ML PO SYRP: 5.0000 mL | ORAL_SOLUTION | ORAL | Status: AC | PRN

## 2011-05-06 MED ORDER — AMOXICILLIN-POT CLAVULANATE 875-125 MG PO TABS: 1.0000 | ORAL_TABLET | Freq: Two times a day (BID) | ORAL | Status: AC

## 2011-05-06 NOTE — Progress Notes (Signed)
  Subjective:    Patient ID: Gerald Johnson, male    DOB: [birth year], 50 y.o.   MRN: [id number]  HPI Here for one week of sinus pressure, PND, ST, and a dry cough. On Mucinex and Nyquil.    Review of Systems  Constitutional: Negative.   HENT: Positive for congestion, postnasal drip and sinus pressure.   Respiratory: Positive for cough.        Objective:   Physical Exam  Constitutional: He appears well-developed and well-nourished.  HENT:  Right Ear: External ear normal.  Left Ear: External ear normal.  Nose: Nose normal.  Mouth/Throat: Oropharynx is clear and moist.  Eyes: Conjunctivae are normal.  Neck: No thyromegaly present.  Pulmonary/Chest: Effort normal and breath sounds normal.  Lymphadenopathy:    He has no cervical adenopathy.          Assessment & Plan:  Recheck prn

## 2011-08-28 ENCOUNTER — Telehealth: Payer: Self-pay | Admitting: Internal Medicine

## 2011-08-28 NOTE — Telephone Encounter (Signed)
Left message on machine on cell phone and home number to call us with more info

## 2011-08-28 NOTE — Telephone Encounter (Signed)
Pt called and said that he is having severe upper abdominal pain. Vomitting, no fever. Pain is a dull constant pain.

## 2011-08-28 NOTE — Telephone Encounter (Signed)
Pt states sx started this am with pain level a 7- has only vomited a couple of times-sx are improving as day goes on- may be stomach virus and instructed clear liquids,rest and if not better in 24 hours will need to be seen- if sx get severe go to er, pt understands and asgrees

## 2011-08-28 NOTE — Telephone Encounter (Signed)
Pls advise.  

## 2011-08-30 ENCOUNTER — Encounter: Payer: Self-pay | Admitting: Family

## 2011-08-30 ENCOUNTER — Ambulatory Visit (INDEPENDENT_AMBULATORY_CARE_PROVIDER_SITE_OTHER): Payer: Managed Care, Other (non HMO) | Admitting: Family

## 2011-08-30 VITALS — BP 124/86 | HR 98 | Temp 98.1°F | Wt 229.0 lb

## 2011-08-30 DIAGNOSIS — R112 Nausea with vomiting, unspecified: Secondary | ICD-10-CM

## 2011-08-30 DIAGNOSIS — A084 Viral intestinal infection, unspecified: Secondary | ICD-10-CM

## 2011-08-30 DIAGNOSIS — A09 Infectious gastroenteritis and colitis, unspecified: Secondary | ICD-10-CM

## 2011-08-30 LAB — BASIC METABOLIC PANEL
CO2: 25 mEq/L (ref 19–32)
Calcium: 8.9 mg/dL (ref 8.4–10.5)
Chloride: 103 mEq/L (ref 96–112)
Creatinine, Ser: 1.4 mg/dL (ref 0.4–1.5)
Glucose, Bld: 88 mg/dL (ref 70–99)

## 2011-08-30 LAB — CBC WITH DIFFERENTIAL/PLATELET
Eosinophils Relative: 2.1 % (ref 0.0–5.0)
HCT: 51.1 % (ref 39.0–52.0)
Hemoglobin: 17.3 g/dL — ABNORMAL HIGH (ref 13.0–17.0)
Lymphocytes Relative: 27.3 % (ref 12.0–46.0)
Lymphs Abs: 0.9 10*3/uL (ref 0.7–4.0)
Monocytes Relative: 9.9 % (ref 3.0–12.0)
Neutro Abs: 2 10*3/uL (ref 1.4–7.7)
RBC: 5.86 Mil/uL — ABNORMAL HIGH (ref 4.22–5.81)
WBC: 3.4 10*3/uL — ABNORMAL LOW (ref 4.5–10.5)

## 2011-08-30 MED ORDER — PROMETHAZINE HCL 25 MG PO TABS: 12.5000 mg | ORAL_TABLET | Freq: Three times a day (TID) | ORAL | Status: DC | PRN

## 2011-08-30 NOTE — Progress Notes (Signed)
Subjective:    Patient ID: Gerald Johnson, male    DOB: [birth year], 51 y.o.   MRN: [id number]  HPI Comments: C/o nausea and vomiting x three on Wed with constant, dull, aching, ab pain 7/10 somewhat relieved with OTC alleve. Had diarrhea x "20" on Thurs with no noted blood. Took OTC peptobismul. No recent travels. ATe out seafood dinner on Wed before the n and v started. No fever or chills.      Review of Systems  Constitutional: Negative.   Respiratory: Negative.   Cardiovascular: Negative.   Gastrointestinal: Positive for vomiting, abdominal pain, diarrhea and abdominal distention. Negative for constipation, blood in stool and anal bleeding.   Past Medical History  Diagnosis Date  . Acute prostatitis 12/02/2007  . ALLERGIC RHINITIS 02/26/2007  . 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 HYPERLIPIDEMIA 02/26/2007  . Sciatica 05/04/2008  . SINUSITIS, ACUTE NOS 02/26/2007  . SINUSITIS, RECURRENT 02/28/2010    History   Social History  . Marital Status: Single    Spouse Name: N/A    Number of Children: N/A  . Years of Education: N/A   Occupational History  . Not on file.   Social History Main Topics  . Smoking status: Never Smoker   . Smokeless tobacco: Never Used  . Alcohol Use: 2.4 oz/week    4 Cans of beer per week  . Drug Use: No  . Sexually Active: Yes    Birth Control/ Protection: Condom   Other Topics Concern  . Not on file   Social History Narrative  . No narrative on file    No past surgical history on file.  Family History  Problem Relation Age of Onset  . Diabetes Brother     No Known Allergies  Current Outpatient Prescriptions on File Prior to Visit  Medication Sig Dispense Refill  . fluticasone (VERAMYST) 27.5 MCG/SPRAY nasal spray 2 sprays by Nasal route daily.  10 g  3  . HYDROcodone-acetaminophen (NORCO) 5-325 MG per tablet Take 1 tablet by mouth every 4 (four) hours as needed for pain.  20 tablet  0  . promethazine (PHENERGAN) 25 MG  tablet Take 0.5-1 tablets (12.5-25 mg total) by mouth every 8 (eight) hours as needed for nausea.  20 tablet  0  . rosuvastatin (CRESTOR) 10 MG tablet Take 10 mg by mouth daily.        . sildenafil (VIAGRA) 50 MG tablet Take 50 mg by mouth daily as needed.          BP 124/86  Pulse 98  Temp 98.1 F (36.7 C)  Wt 229 lb (103.874 kg)  SpO2 96%chart     Objective:   Physical Exam  Constitutional: He is oriented to person, place, and time. He appears well-developed and well-nourished. No distress.  Cardiovascular: Normal rate, normal heart sounds and intact distal pulses.  Exam reveals no gallop and no friction rub.   No murmur heard. Pulmonary/Chest: Effort normal and breath sounds normal. No respiratory distress. He has no wheezes. He has no rales. He exhibits no tenderness.  Abdominal: Soft. Bowel sounds are normal. He exhibits distension. He exhibits no mass. There is no tenderness. There is no rebound and no guarding.  Neurological: He is alert and oriented to person, place, and time.  Skin: Skin is warm and dry. He is not diaphoretic.          Assessment & Plan:  Assessment: Viral gastroenteritis, Nausea & Vomiting  Plan: BLand diet, continue to force  po fluids, phenergan, RTC if s/s do not resolve or get worse. Teaching handout provided on diagnosis and treatment. Phenergan 25mg  Q8hr as needed. Opportunity for questions provided

## 2011-08-30 NOTE — Patient Instructions (Signed)
Viral Gastroenteritis Viral gastroenteritis is also known as stomach flu. This condition affects the stomach and intestinal tract. It can cause sudden diarrhea and vomiting. The illness typically lasts 3 to 8 days. Most people develop an immune response that eventually gets rid of the virus. While this natural response develops, the virus can make you quite ill. CAUSES  Many different viruses can cause gastroenteritis, such as rotavirus or noroviruses. You can catch one of these viruses by consuming contaminated food or water. You may also catch a virus by sharing utensils or other personal items with an infected person or by touching a contaminated surface. SYMPTOMS  The most common symptoms are diarrhea and vomiting. These problems can cause a severe loss of body fluids (dehydration) and a body salt (electrolyte) imbalance. Other symptoms may include:  Fever.   Headache.   Fatigue.   Abdominal pain.  DIAGNOSIS  Your caregiver can usually diagnose viral gastroenteritis based on your symptoms and a physical exam. A stool sample may also be taken to test for the presence of viruses or other infections. TREATMENT  This illness typically goes away on its own. Treatments are aimed at rehydration. The most serious cases of viral gastroenteritis involve vomiting so severely that you are not able to keep fluids down. In these cases, fluids must be given through an intravenous line (IV). HOME CARE INSTRUCTIONS   Drink enough fluids to keep your urine clear or pale yellow. Drink small amounts of fluids frequently and increase the amounts as tolerated.   Ask your caregiver for specific rehydration instructions.   Avoid:   Foods high in sugar.   Alcohol.   Carbonated drinks.   Tobacco.   Juice.   Caffeine drinks.   Extremely hot or cold fluids.   Fatty, greasy foods.   Too much intake of anything at one time.   Dairy products until 24 to 48 hours after diarrhea stops.   You may  consume probiotics. Probiotics are active cultures of beneficial bacteria. They may lessen the amount and number of diarrheal stools in adults. Probiotics can be found in yogurt with active cultures and in supplements.   Wash your hands well to avoid spreading the virus.   Only take over-the-counter or prescription medicines for pain, discomfort, or fever as directed by your caregiver. Do not give aspirin to children. Antidiarrheal medicines are not recommended.   Ask your caregiver if you should continue to take your regular prescribed and over-the-counter medicines.   Keep all follow-up appointments as directed by your caregiver.  SEEK IMMEDIATE MEDICAL CARE IF:   You are unable to keep fluids down.   You do not urinate at least once every 6 to 8 hours.   You develop shortness of breath.   You notice blood in your stool or vomit. This may look like coffee grounds.   You have abdominal pain that increases or is concentrated in one small area (localized).   You have persistent vomiting or diarrhea.   You have a fever.   The patient is a child younger than 3 months, and he or she has a fever.   The patient is a child older than 3 months, and he or she has a fever and persistent symptoms.   The patient is a child older than 3 months, and he or she has a fever and symptoms suddenly get worse.   The patient is a baby, and he or she has no tears when crying.  MAKE SURE YOU:     Understand these instructions.   Will watch your condition.   Will get help right away if you are not doing well or get worse.  Document Released: 05/13/2005 Document Revised: 05/02/2011 Document Reviewed: 02/27/2011 Exit[employer] Patient Information 2012 [employer]. 

## 2011-08-30 NOTE — Progress Notes (Deleted)
  Subjective:    Patient ID: Gerald Johnson, male    DOB: [birth year], 51 y.o.   MRN: [id number]  HPI    Review of Systems     Objective:   Physical Exam        Assessment & Plan:

## 2011-09-03 ENCOUNTER — Telehealth: Payer: Self-pay | Admitting: Internal Medicine

## 2011-09-03 MED ORDER — AZITHROMYCIN 250 MG PO TABS: ORAL_TABLET | ORAL | Status: AC

## 2011-09-03 NOTE — Telephone Encounter (Signed)
Zpak as directed #1 pack, no refills

## 2011-09-03 NOTE — Telephone Encounter (Signed)
Pt called and was in to see Adline Mango last week for stomach virus and now has a sinus inf. Pt is req to get an abx called in to [facility] on [address]. Pt req to be called when this has been done.

## 2011-09-03 NOTE — Telephone Encounter (Signed)
Rx sent to pharmacy   

## 2011-10-08 ENCOUNTER — Encounter: Payer: Self-pay | Admitting: Family

## 2011-10-08 ENCOUNTER — Ambulatory Visit (INDEPENDENT_AMBULATORY_CARE_PROVIDER_SITE_OTHER): Payer: Managed Care, Other (non HMO) | Admitting: Family

## 2011-10-08 VITALS — BP 120/84 | HR 90 | Temp 97.7°F | Wt 240.0 lb

## 2011-10-08 DIAGNOSIS — H5713 Ocular pain, bilateral: Secondary | ICD-10-CM

## 2011-10-08 DIAGNOSIS — G44009 Cluster headache syndrome, unspecified, not intractable: Secondary | ICD-10-CM

## 2011-10-08 DIAGNOSIS — H5710 Ocular pain, unspecified eye: Secondary | ICD-10-CM

## 2011-10-08 MED ORDER — SUMATRIPTAN 20 MG/ACT NA SOLN: 1.0000 | NASAL | Status: DC | PRN

## 2011-10-08 NOTE — Patient Instructions (Signed)
Cluster Headache  Cluster headaches are recognized by their pattern of deep, intense head pain. They normally occur on one side of the head. Typically, cluster headaches:   Are severe in nature.   Occur repeatedly over weeks to months at a time and are then followed by periods of no headaches.   Can last 15 minutes to 3 hours.   Occur at the same time each day, often at night.   Occur several times a day.  CAUSES  The exact cause of a cluster headache is not known. Some things can trigger a cluster headache, such as:   Smoking.   Alcohol use.   Lack of sleep.   High altitude travel, such as airline travel.   Change of seasons.   Certain foods, such as foods or drinks that contain nitrates, glutamate, aspartame, or tyramine.  SYMPTOMS    Severe pain that begins in or around the eye or temple.   One-sided head pain.   Feeling sick to your stomach (nauseous).   Sensitivity to light.   Runny nose.   Eye redness, tearing, and nasal stuffiness on the side of your head where you are experiencing pain.   Sweaty, pale skin of the face.   Droopy eyelid.  DIAGNOSIS   Cluster headaches are diagnosed based on symptoms and physical exam. Your caregiver may order a computerized X-ray scan (CT or CAT scan) or a computerized magnetic scan (MRI) of your head or other lab tests to see if your headaches are caused from other medical conditions.   TREATMENT    Medications for pain relief and to prevent recurrent attacks.   Oxygen for pain relief.   Biofeedback programs may help reduce headache pain.  Some people may need a combination of medicines for cluster headaches. It may be helpful to keep a headache diary. This may help you find a trend for what is triggering your headaches. Your caregiver can develop a treatment plan that can be helpful to you.   HOME CARE INSTRUCTIONS   During cluster periods:   Follow a regular sleep schedule.Do not vary the amount and time that you sleep from day to day. It is  important to stay on the same schedule during a cluster period to help prevent headaches.   Avoid alcohol.   Stop smoking.  SEEK IMMEDIATE MEDICAL CARE IF:    You have any changes from your previous cluster headaches either in intensity or frequency.   You are not getting relief from medicines you are taking.   You faint.   You develop weakness or numbness, especially on one side of your body or face.   You develop double vision.   You develop nausea or vomiting.   You cannot keep your balance or have difficulty talking or walking.   You develop neck pain or neck stiffness.   You have a fever.  Document Released: 05/13/2005 Document Revised: 05/02/2011 Document Reviewed: 08/11/2010  ExitCare Patient Information 2012 [employer].

## 2011-10-09 ENCOUNTER — Other Ambulatory Visit: Payer: Self-pay

## 2011-10-09 ENCOUNTER — Encounter: Payer: Self-pay | Admitting: Family

## 2011-10-09 MED ORDER — SUMATRIPTAN 20 MG/ACT NA SOLN: NASAL | Status: DC

## 2011-10-09 NOTE — Progress Notes (Signed)
Subjective:    Patient ID: Gerald Johnson, male    DOB: 1960-07-21, 51 y.o.   MRN: [id number]  HPI 51 year old [place] American male, nonsmoker, patient of Dr. Lovell Sheehan is in with complaints of bilateral pressure behind his eyes. He's had these symptoms intermittently x1 year. He occasionally experiences nausea and vomiting. The headaches tend to last all day unless he takes medication. His taken Excedrin that helped his symptoms when he doubles the dose. Drinks about 3 status today. Also admits to consuming about 3 alcoholic beverages a day. 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 He sits in front of a computer screen the bulk of this day. He denies any blurred vision, double vision, sneezing, coughing, congestion, fever, muscle aches or pain. As of note, patient had a CT scan of his sinuses in the past has shown chronic sinusitis. He's been treated for chronic sinusitis and it has not been effective.   Review of Systems  Constitutional: Negative.   Eyes: Positive for pain and redness. Negative for photophobia, discharge, itching and visual disturbance.  Respiratory: Negative.   Cardiovascular: Negative.   Gastrointestinal: Negative.   Genitourinary: Negative.   Musculoskeletal: Negative.   Skin: Negative.   Neurological: Positive for headaches. Negative for dizziness and numbness.  Hematological: Negative.   Psychiatric/Behavioral: Negative.    Past Medical History  Diagnosis Date  . Acute prostatitis 12/02/2007  . ALLERGIC RHINITIS 02/26/2007  . 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 HYPERLIPIDEMIA 02/26/2007  . Sciatica 05/04/2008  . SINUSITIS, ACUTE NOS 02/26/2007  . SINUSITIS, RECURRENT 02/28/2010    History   Social History  . Marital Status: Single    Spouse Name: N/A    Number of Children: N/A  . Years of Education: N/A   Occupational History  . Not on file.   Social History Main Topics  . Smoking status: Never Smoker   .  Smokeless tobacco: Never Used  . Alcohol Use: 2.4 oz/week    4 Cans of beer per week  . Drug Use: No  . Sexually Active: Yes    Birth Control/ Protection: Condom   Other Topics Concern  . Not on file   Social History Narrative  . No narrative on file    No past surgical history on file.  Family History  Problem Relation Age of Onset  . Diabetes Brother     No Known Allergies  Current Outpatient Prescriptions on File Prior to Visit  Medication Sig Dispense Refill  . HYDROcodone-acetaminophen (NORCO) 5-325 MG per tablet Take 1 tablet by mouth every 4 (four) hours as needed for pain.  20 tablet  0  . fluticasone (VERAMYST) 27.5 MCG/SPRAY nasal spray 2 sprays by Nasal route daily.  10 g  3  . promethazine (PHENERGAN) 25 MG tablet Take 0.5-1 tablets (12.5-25 mg total) by mouth every 8 (eight) hours as needed for nausea.  20 tablet  0  . rosuvastatin (CRESTOR) 10 MG tablet Take 10 mg by mouth daily.        . sildenafil (VIAGRA) 50 MG tablet Take 50 mg by mouth daily as needed.        . SUMAtriptan (IMITREX) 20 MG/ACT nasal spray One spray in the nostril, may repeat in 2 hours if headache persists.  1 Inhaler  1    BP 120/84  Pulse 90  Temp 97.7 F (36.5 C)  Wt 240 lb (108.863 kg)  SpO2 97%chart    Objective:  Physical Exam  Constitutional: He is oriented to person, place, and time. He appears well-developed and well-nourished.  Neck: Normal range of motion. Neck supple.  Cardiovascular: Normal rate, regular rhythm and normal heart sounds.   Pulmonary/Chest: Effort normal and breath sounds normal.  Musculoskeletal: Normal range of motion.  Neurological: He is alert and oriented to person, place, and time.  Skin: Skin is warm and dry.  Psychiatric: He has a normal mood and affect.          Assessment & Plan:  Assessment: Cluster headaches  Plan: Imitrex nasal 1 spray in a nostril at the onset of the headache, may repeat 2 hours later if headache persists. Decrease  caffeine intake, decrease alcohol consumption, increased sleep. Advised to. Patient call the office if his symptoms worsen or persist. Will bring patient back for recheck in one week and sooner when necessary.

## 2011-10-22 ENCOUNTER — Telehealth: Payer: Self-pay | Admitting: Internal Medicine

## 2011-10-22 NOTE — Telephone Encounter (Signed)
error 

## 2011-11-22 ENCOUNTER — Telehealth: Payer: Self-pay | Admitting: Internal Medicine

## 2011-11-22 DIAGNOSIS — M79673 Pain in unspecified foot: Secondary | ICD-10-CM

## 2011-11-22 NOTE — Telephone Encounter (Signed)
Patient came in stating that in order for him to see his usual Podiatrist for his heel pain, his insurance requires a referral. Dr. Irving Shows at [facility], [address] [place] ph. [phone]. Please assist.

## 2011-12-30 ENCOUNTER — Other Ambulatory Visit: Payer: Self-pay

## 2011-12-30 MED ORDER — SUMATRIPTAN 20 MG/ACT NA SOLN: NASAL | Status: DC

## 2012-02-13 ENCOUNTER — Encounter: Payer: Self-pay | Admitting: Family Medicine

## 2012-02-13 ENCOUNTER — Ambulatory Visit (INDEPENDENT_AMBULATORY_CARE_PROVIDER_SITE_OTHER): Payer: Managed Care, Other (non HMO) | Admitting: Family Medicine

## 2012-02-13 VITALS — BP 110/82 | Temp 98.3°F | Wt 244.0 lb

## 2012-02-13 DIAGNOSIS — J309 Allergic rhinitis, unspecified: Secondary | ICD-10-CM

## 2012-02-13 MED ORDER — MOMETASONE FUROATE 50 MCG/ACT NA SUSP: 2.0000 | Freq: Every day | NASAL | Status: DC

## 2012-02-13 MED ORDER — FLUTICASONE PROPIONATE 50 MCG/ACT NA SUSP: 2.0000 | Freq: Every day | NASAL | Status: DC

## 2012-02-13 NOTE — Patient Instructions (Addendum)
Allergic Rhinitis Allergic rhinitis is when the mucous membranes in the nose respond to allergens. Allergens are particles in the air that cause your body to have an allergic reaction. This causes you to release allergic antibodies. Through a chain of events, these eventually cause you to release histamine into the blood stream (hence the use of antihistamines). Although meant to be protective to the body, it is this release that causes your discomfort, such as frequent sneezing, congestion and an itchy runny nose.  CAUSES  The pollen allergens may come from grasses, trees, and weeds. This is seasonal allergic rhinitis, or "hay fever." Other allergens cause year-round allergic rhinitis (perennial allergic rhinitis) such as house dust mite allergen, pet dander and mold spores.  SYMPTOMS   Nasal stuffiness (congestion).   Runny, itchy nose with sneezing and tearing of the eyes.   There is often an itching of the mouth, eyes and ears.  It cannot be cured, but it can be controlled with medications. DIAGNOSIS  If you are unable to determine the offending allergen, skin or blood testing may find it. TREATMENT   Avoid the allergen.   Medications and allergy shots (immunotherapy) can help.   Hay fever may often be treated with antihistamines in pill or nasal spray forms. Antihistamines block the effects of histamine. There are over-the-counter medicines that may help with nasal congestion and swelling around the eyes. Check with your caregiver before taking or giving this medicine.  If the treatment above does not work, there are many new medications your caregiver can prescribe. Stronger medications may be used if initial measures are ineffective. Desensitizing injections can be used if medications and avoidance fails. Desensitization is when a patient is given ongoing shots until the body becomes less sensitive to the allergen. Make sure you follow up with your caregiver if problems continue. SEEK  MEDICAL CARE IF:   You develop fever (more than 100.5 F (38.1 C).   You develop a cough that does not stop easily (persistent).   You have shortness of breath.   You start wheezing.   Symptoms interfere with normal daily activities.  Document Released: 02/05/2001 Document Revised: 05/02/2011 Document Reviewed: 08/17/2008 [facility] Patient Information 2012 [place], [place].  Try over the counter anti histamine such as Allegra.

## 2012-02-13 NOTE — Progress Notes (Signed)
  Subjective:    Patient ID: Gerald Johnson, male    DOB: 1961-02-16, 51 y.o.   MRN: [id number]  HPI  Patient seen with approximate four-day history of nasal congestion with mostly clear drainage and  symptoms of watery eye discharge and itching. No body aches. No fever. Mild dry cough. Increased postnasal drip. History of seasonal allergies in the past with symptoms worse during fall. Currently not taking any antihistamines. Previously has used nasal steroid sprays which have helped. Never smoked.   Review of Systems  Constitutional: Negative for fever and chills.  HENT: Positive for congestion and postnasal drip. Negative for sore throat.   Respiratory: Positive for cough. Negative for shortness of breath and wheezing.   Neurological: Negative for headaches.       Objective:   Physical Exam  Constitutional: He appears well-developed and well-nourished.  HENT:  Right Ear: External ear normal.  Left Ear: External ear normal.  Mouth/Throat: Oropharynx is clear and moist.  Neck: Neck supple.  Cardiovascular: Normal rate and regular rhythm.   Pulmonary/Chest: Effort normal and breath sounds normal. No respiratory distress. He has no wheezes. He has no rales.  Lymphadenopathy:    He has no cervical adenopathy.          Assessment & Plan:  Rhinitis. Suspect allergic. Allegra 180 mg once daily. Nasonex 2 sprays per nostril once daily. Followup as needed

## 2012-02-20 ENCOUNTER — Ambulatory Visit (INDEPENDENT_AMBULATORY_CARE_PROVIDER_SITE_OTHER): Payer: Managed Care, Other (non HMO) | Admitting: Family Medicine

## 2012-02-20 ENCOUNTER — Encounter: Payer: Self-pay | Admitting: Family Medicine

## 2012-02-20 ENCOUNTER — Telehealth: Payer: Self-pay | Admitting: Internal Medicine

## 2012-02-20 VITALS — BP 110/78 | Temp 98.6°F | Wt 245.0 lb

## 2012-02-20 DIAGNOSIS — J069 Acute upper respiratory infection, unspecified: Secondary | ICD-10-CM

## 2012-02-20 NOTE — Progress Notes (Signed)
Chief Complaint  Patient presents with  . Hoarse    HPI:  Sore throat: -started about 9 days ago -symptoms: sore throat, drainage, nasal congestion, cough -denies: fevers, chills, ear pain, tooth, SOB -using nasonex and did try sudafed, but made him tired -no known sick contacts -no medication allergies  ROS: See pertinent positives and negatives per HPI.  Past Medical History  Diagnosis Date  . Acute prostatitis 12/02/2007  . ALLERGIC RHINITIS 02/26/2007  . 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 HYPERLIPIDEMIA 02/26/2007  . Sciatica 05/04/2008  . SINUSITIS, ACUTE NOS 02/26/2007  . SINUSITIS, RECURRENT 02/28/2010    Family History  Problem Relation Age of Onset  . Diabetes Brother     History   Social History  . Marital Status: Single    Spouse Name: N/A    Number of Children: N/A  . Years of Education: N/A   Social History Main Topics  . Smoking status: Never Smoker   . Smokeless tobacco: Never Used  . Alcohol Use: 2.4 oz/week    4 Cans of beer per week  . Drug Use: No  . Sexually Active: Yes    Birth Control/ Protection: Condom   Other Topics Concern  . None   Social History Narrative  . None    Current outpatient prescriptions:mometasone (NASONEX) 50 MCG/ACT nasal spray, Place 2 sprays into the nose daily., Disp: 17 g, Rfl: 12;  rosuvastatin (CRESTOR) 10 MG tablet, Take 10 mg by mouth daily.  , Disp: , Rfl: ;  sildenafil (VIAGRA) 50 MG tablet, Take 50 mg by mouth daily as needed.  , Disp: , Rfl: ;  SUMAtriptan (IMITREX) 20 MG/ACT nasal spray, One spray in the nostril, may repeat in 2 hours if headache persists., Disp: 1 Inhaler, Rfl: 1  EXAM:  Filed Vitals:   02/20/12 1054  BP: 110/78  Temp: 98.6 F (37 C)    There is no height on file to calculate BMI.  GENERAL: vitals reviewed and listed below, alert, oriented, appears well hydrated and in no acute distress  HEENT: atraumatic, conjucntiva clear, no obvious abnormalities on inspection  of external nose and ears, normal ear canals and TM, thick clear nasal congestion and nasal mucosal erythema, PND, oropharyngeal erythema  NECK: no masses on inspection, no LAD  LUNGS: clear to auscultation bilaterally, no wheezes, rales or rhonchi, good air movement  CV: HRRR, no peripheral edema  MS: moves all extremities without noticeable abnormality  PSYCH: pleasant and cooperative, no obvious depression or anxiety  ASSESSMENT AND PLAN:  Discussed the following assessment and plan:  1. Upper respiratory infection   -likely viral -nasonex samples provided  Patient Instructions  -plenty of rest and fluids  -nasal saline (use prepackaged nasal saline or bottle water if making your own)  -clean nose with nasal saline before using the nasonex or sinex  -can use sinex nasal spray for drainage and nasal congestion - but only use for 3-4 days, no longer  -can use tylenol or ibuprofen as directed for aches and feeling bad  -follow up if fevers, worsening or not better in in 7 days      Return to clinic immediately if symptoms worsen or persist or new concerns.  No Follow-up on file.  Kriste Basque R.

## 2012-02-20 NOTE — Telephone Encounter (Signed)
Noted  

## 2012-02-20 NOTE — Telephone Encounter (Signed)
Caller: Gerald Johnson/Patient; Patient Name: Gerald Johnson; PCP: Darryll Capers (Adults only); Best Callback Phone Number: [phone]; Reason for call: Cough/Congestion.  Onset 02/11/12.  He saw PCP thursday 02/13/12.  He was given a nasal spray and sudafed and has not improved.  Voice Hoarse . Afebrile, +cough until emesis- yellow productive, +runny nose yellow.  He is able to eat and drink.  He is using nasonex . Emergent s/sx ruled out per Upper Respiratory Infection (URI) with exception to "Symptoms worsen after 7 days or symptoms do not improve after 14 days of home care". See provider in 24 hours.  Scheduled appointment today 02/20/12 with Dr. Selena Batten at [place] office for evaluation and care. Understanding expressed. Caller will call back with questions, changes or concerns.

## 2012-02-20 NOTE — Patient Instructions (Addendum)
-  plenty of rest and fluids  -nasal saline (use prepackaged nasal saline or bottle water if making your own)  -clean nose with nasal saline before using the nasonex or sinex  -can use sinex nasal spray for drainage and nasal congestion - but only use for 3-4 days, no longer  -can use tylenol or ibuprofen as directed for aches and feeling bad  -follow up if fevers, worsening or not better in in 7 days

## 2012-03-25 ENCOUNTER — Telehealth: Payer: Self-pay | Admitting: Internal Medicine

## 2012-03-25 NOTE — Telephone Encounter (Signed)
You may schedule psa and colonoscopy

## 2012-03-25 NOTE — Telephone Encounter (Signed)
Lmom for pt to call back. 

## 2012-03-25 NOTE — Telephone Encounter (Signed)
Pt would like to have psa blood work test. Can I sch? Pt also would like colonoscopy due to age.

## 2012-03-26 NOTE — Telephone Encounter (Signed)
Pt is sch for lab on 03-31-2012 4pm

## 2012-03-26 NOTE — Telephone Encounter (Signed)
There is no referral for this pt. I think Rushie Goltz was saying it is ok for pt to schedule a Colonoscopy. For  Routine colon pt can schedule directly with gi

## 2012-03-26 NOTE — Telephone Encounter (Signed)
This is a screening colonoscopy. Pt stated he never had one

## 2012-03-27 NOTE — Telephone Encounter (Signed)
I do not have a referral for this pt

## 2012-03-30 ENCOUNTER — Telehealth: Payer: Self-pay | Admitting: *Deleted

## 2012-03-30 DIAGNOSIS — Z1211 Encounter for screening for malignant neoplasm of colon: Secondary | ICD-10-CM

## 2012-03-30 NOTE — Telephone Encounter (Signed)
Gerald Johnson please put referral in for this pt to have colonoscopy

## 2012-03-30 NOTE — Telephone Encounter (Signed)
Referral sent 

## 2012-03-30 NOTE — Telephone Encounter (Signed)
Done

## 2012-03-31 ENCOUNTER — Other Ambulatory Visit (INDEPENDENT_AMBULATORY_CARE_PROVIDER_SITE_OTHER): Payer: Managed Care, Other (non HMO)

## 2012-03-31 DIAGNOSIS — N529 Male erectile dysfunction, unspecified: Secondary | ICD-10-CM

## 2012-03-31 LAB — PSA: PSA: 1.38 ng/mL (ref 0.10–4.00)

## 2012-04-22 ENCOUNTER — Encounter: Payer: Self-pay | Admitting: Family

## 2012-04-22 ENCOUNTER — Ambulatory Visit (INDEPENDENT_AMBULATORY_CARE_PROVIDER_SITE_OTHER): Payer: Managed Care, Other (non HMO) | Admitting: Family

## 2012-04-22 VITALS — BP 114/78 | HR 87 | Temp 98.3°F | Wt 238.0 lb

## 2012-04-22 DIAGNOSIS — G44009 Cluster headache syndrome, unspecified, not intractable: Secondary | ICD-10-CM

## 2012-04-22 MED ORDER — KETOROLAC TROMETHAMINE 60 MG/2ML IM SOLN
60.0000 mg | Freq: Once | INTRAMUSCULAR | Status: AC
  Administered 2012-04-22: 60 mg via INTRAMUSCULAR

## 2012-04-22 MED ORDER — KETOROLAC TROMETHAMINE 60 MG/2ML IM SOLN: 60.0000 mg | Freq: Once | INTRAMUSCULAR | Status: DC

## 2012-04-22 MED ORDER — SUMATRIPTAN 20 MG/ACT NA SOLN: NASAL | Status: DC

## 2012-04-22 NOTE — Progress Notes (Signed)
Subjective:    Patient ID: Gerald Johnson, male    DOB: 1961/04/27, 51 y.o.   MRN: [id number]  HPI 51 year old African American male, nonsmoker, patient of Dr. Lovell Sheehan is in today with complaints of pain behind his left eye times one day. He has a history of cluster headaches for which he's taken Imitrex that's been effective. He currently on Imitrex. Denies any blurred vision, double vision, lightheadedness or dizziness. Patient was scheduled to have a CT scan of his head done last year per Dr. Kirtland Bouchard. but never had it done. He likes to have it performed today and possibly a referral to neurology. Reports having a headache about once every 2-3 weeks lasting one day. Also response to Excedrin. Denies any nausea, vomiting, sensitivity to light or noise associated with the headaches. Denies any sneezing coughing or congestion.  Review of Systems  Constitutional: Negative.   HENT: Negative.   Eyes: Negative.   Respiratory: Negative.   Cardiovascular: Negative.   Gastrointestinal: Negative.   Genitourinary: Negative.   Musculoskeletal: Negative.   Skin: Negative.   Neurological: Negative.   Hematological: Negative.   Psychiatric/Behavioral: Negative.    Past Medical History  Diagnosis Date  . Acute prostatitis 12/02/2007  . ALLERGIC RHINITIS 02/26/2007  . 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 HYPERLIPIDEMIA 02/26/2007  . Sciatica 05/04/2008  . SINUSITIS, ACUTE NOS 02/26/2007  . SINUSITIS, RECURRENT 02/28/2010    History   Social History  . Marital Status: Single    Spouse Name: N/A    Number of Children: N/A  . Years of Education: N/A   Occupational History  . Not on file.   Social History Main Topics  . Smoking status: Never Smoker   . Smokeless tobacco: Never Used  . Alcohol Use: 2.4 oz/week    4 Cans of beer per week  . Drug Use: No  . Sexually Active: Yes    Birth Control/ Protection: Condom   Other Topics Concern  . Not on file   Social History Narrative  .  No narrative on file    No past surgical history on file.  Family History  Problem Relation Age of Onset  . Diabetes Brother     No Known Allergies  Current Outpatient Prescriptions on File Prior to Visit  Medication Sig Dispense Refill  . [DISCONTINUED] SUMAtriptan (IMITREX) 20 MG/ACT nasal spray One spray in the nostril, may repeat in 2 hours if headache persists.  1 Inhaler  1  . mometasone (NASONEX) 50 MCG/ACT nasal spray Place 2 sprays into the nose daily.  17 g  12  . rosuvastatin (CRESTOR) 10 MG tablet Take 10 mg by mouth daily.        . sildenafil (VIAGRA) 50 MG tablet Take 50 mg by mouth daily as needed.         No current facility-administered medications on file prior to visit.    BP 114/78  Pulse 87  Temp 98.3 F (36.8 C)  Wt 238 lb (107.956 kg)  SpO2 95%chart    Objective:   Physical Exam  Constitutional: He is oriented to person, place, and time. He appears well-developed and well-nourished.  HENT:  Right Ear: External ear normal.  Left Ear: External ear normal.  Nose: Nose normal.  Mouth/Throat: Oropharynx is clear and moist.  Eyes: Conjunctivae normal and EOM are normal. Pupils are equal, round, and reactive to light.  Neck: Normal range of motion. Neck supple.  Cardiovascular: Normal rate, regular rhythm and normal heart sounds.  Pulmonary/Chest: Effort normal and breath sounds normal.  Abdominal: Soft. Bowel sounds are normal.  Musculoskeletal: Normal range of motion.  Neurological: He is alert and oriented to person, place, and time.  Skin: Skin is warm and dry.  Psychiatric: He has a normal mood and affect.     Toradol 60 mg IM x1     Assessment & Plan:  Assessment: Cluster headaches-persistent  Plan: Refilled Imitrex. Advise CT scan of the area without contrast to evaluate to rule out any malignancies. There is any indication to refer neurology, I would do so. Advised patient to decrease his caffeine consumption. And minimize Excedrin use.  Advised Aleve. Patient call the office if symptoms worsen or persist. Recheck as scheduled, and as needed.

## 2012-06-01 ENCOUNTER — Telehealth: Payer: Self-pay | Admitting: Internal Medicine

## 2012-06-01 DIAGNOSIS — G44009 Cluster headache syndrome, unspecified, not intractable: Secondary | ICD-10-CM

## 2012-06-01 NOTE — Telephone Encounter (Signed)
Patient came in stating that he never heard back about the neurology referral that was supposed to have been put in at his last visit with [facility]. Please assist.

## 2012-06-01 NOTE — Telephone Encounter (Signed)
Referral order placed.

## 2012-06-22 ENCOUNTER — Ambulatory Visit: Payer: Managed Care, Other (non HMO) | Admitting: Family Medicine

## 2012-06-23 ENCOUNTER — Encounter: Payer: Self-pay | Admitting: Family

## 2012-06-23 ENCOUNTER — Ambulatory Visit (INDEPENDENT_AMBULATORY_CARE_PROVIDER_SITE_OTHER): Payer: Managed Care, Other (non HMO) | Admitting: Family

## 2012-06-23 VITALS — BP 120/90 | HR 82 | Temp 98.0°F | Resp 20 | Wt 242.0 lb

## 2012-06-23 DIAGNOSIS — J069 Acute upper respiratory infection, unspecified: Secondary | ICD-10-CM

## 2012-06-23 DIAGNOSIS — R110 Nausea: Secondary | ICD-10-CM

## 2012-06-23 MED ORDER — FEXOFENADINE-PSEUDOEPHED ER 180-240 MG PO TB24: 1.0000 | ORAL_TABLET | Freq: Every day | ORAL | Status: DC

## 2012-06-23 NOTE — Progress Notes (Signed)
Subjective:    Patient ID: Gerald Johnson, male    DOB: 1961/01/27, 52 y.o.   MRN: [id number]  HPI 52 year old African American male, nonsmoker, patient of Dr. Lovell Sheehan is in today with complaints of cough, sore throat, congestion x1 week. His intake and Nasonex and an over-the-counter cough medication with no relief. Reports vomiting mucus. No heartburn or indigestion.   Review of Systems  Constitutional: Negative.   HENT: Positive for congestion, sore throat, rhinorrhea and postnasal drip.   Eyes: Negative.   Respiratory: Positive for cough.   Cardiovascular: Negative.   Gastrointestinal: Positive for nausea and vomiting.  Musculoskeletal: Negative.   Skin: Negative.   Hematological: Negative.   Psychiatric/Behavioral: Negative.    Past Medical History  Diagnosis Date  . Acute prostatitis 12/02/2007  . ALLERGIC RHINITIS 02/26/2007  . 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 HYPERLIPIDEMIA 02/26/2007  . Sciatica 05/04/2008  . SINUSITIS, ACUTE NOS 02/26/2007  . SINUSITIS, RECURRENT 02/28/2010    History   Social History  . Marital Status: Single    Spouse Name: N/A    Number of Children: N/A  . Years of Education: N/A   Occupational History  . Not on file.   Social History Main Topics  . Smoking status: Never Smoker   . Smokeless tobacco: Never Used  . Alcohol Use: 2.4 oz/week    4 Cans of beer per week  . Drug Use: No  . Sexually Active: Yes    Birth Control/ Protection: Condom   Other Topics Concern  . Not on file   Social History Narrative  . No narrative on file    No past surgical history on file.  Family History  Problem Relation Age of Onset  . Diabetes Brother     No Known Allergies  Current Outpatient Prescriptions on File Prior to Visit  Medication Sig Dispense Refill  . mometasone (NASONEX) 50 MCG/ACT nasal spray Place 2 sprays into the nose daily.  17 g  12  . rosuvastatin (CRESTOR) 10 MG tablet Take 10 mg by mouth daily.        .  sildenafil (VIAGRA) 50 MG tablet Take 50 mg by mouth daily as needed.        . SUMAtriptan (IMITREX) 20 MG/ACT nasal spray One spray in the nostril, may repeat in 2 hours if headache persists.  1 Inhaler  3  . topiramate (TOPAMAX) 25 MG tablet         BP 120/90  Pulse 82  Temp 98 F (36.7 C) (Oral)  Resp 20  Wt 242 lb (109.77 kg)  SpO2 97%chart    Objective:   Physical Exam  Constitutional: He is oriented to person, place, and time. He appears well-developed and well-nourished.  HENT:  Right Ear: External ear normal.  Left Ear: External ear normal.  Nose: Nose normal.  Mouth/Throat: Oropharynx is clear and moist.  Neck: Normal range of motion. Neck supple.  Cardiovascular: Normal rate, regular rhythm and normal heart sounds.   Pulmonary/Chest: Effort normal and breath sounds normal.  Neurological: He is alert and oriented to person, place, and time.  Skin: Skin is warm and dry.  Psychiatric: He has a normal mood and affect.          Assessment & Plan:  Assessment:  1. Upper respiratory infection 2. Cough 3. Nausea and Vomiting  Plan: Antihistamine decongestant, Allegra-D 24 hours once daily. Rest. Drink plenty of fluids. Patient advised call the office if symptoms worsen or persist. Recheck  a schedule, and as needed.

## 2012-06-23 NOTE — Patient Instructions (Addendum)

## 2012-06-24 ENCOUNTER — Encounter: Payer: Self-pay | Admitting: Internal Medicine

## 2012-07-01 ENCOUNTER — Encounter: Payer: Managed Care, Other (non HMO) | Admitting: Internal Medicine

## 2012-07-02 ENCOUNTER — Encounter: Payer: Self-pay | Admitting: Internal Medicine

## 2012-07-02 ENCOUNTER — Ambulatory Visit (AMBULATORY_SURGERY_CENTER): Payer: Managed Care, Other (non HMO) | Admitting: *Deleted

## 2012-07-02 VITALS — Ht 74.0 in | Wt 236.8 lb

## 2012-07-02 DIAGNOSIS — Z1211 Encounter for screening for malignant neoplasm of colon: Secondary | ICD-10-CM

## 2012-07-02 MED ORDER — PEG-KCL-NACL-NASULF-NA ASC-C 100 G PO SOLR: ORAL | Status: DC

## 2012-07-02 NOTE — Progress Notes (Signed)
No egg or soy allergy 

## 2012-07-13 ENCOUNTER — Other Ambulatory Visit: Payer: Self-pay | Admitting: Family Medicine

## 2012-07-14 NOTE — Telephone Encounter (Signed)
He saw Padonda for this URI

## 2012-07-15 ENCOUNTER — Other Ambulatory Visit: Payer: Self-pay | Admitting: Family Medicine

## 2012-07-15 NOTE — Telephone Encounter (Signed)
Call in another bottle please

## 2012-07-21 ENCOUNTER — Encounter: Payer: Self-pay | Admitting: Internal Medicine

## 2012-07-21 ENCOUNTER — Ambulatory Visit (AMBULATORY_SURGERY_CENTER): Payer: Managed Care, Other (non HMO) | Admitting: Internal Medicine

## 2012-07-21 VITALS — BP 120/63 | HR 64 | Temp 98.0°F | Resp 20 | Ht 74.0 in | Wt 236.0 lb

## 2012-07-21 DIAGNOSIS — Z1211 Encounter for screening for malignant neoplasm of colon: Secondary | ICD-10-CM

## 2012-07-21 MED ORDER — SODIUM CHLORIDE 0.9 % IV SOLN: 500.0000 mL | INTRAVENOUS | Status: DC

## 2012-07-21 NOTE — Progress Notes (Signed)
Patient did not experience any of the following events: a burn prior to discharge; a fall within the facility; wrong site/side/patient/procedure/implant event; or a hospital transfer or hospital admission upon discharge from the facility. (G8907) Patient did not have preoperative order for IV antibiotic SSI prophylaxis. (G8918)  

## 2012-07-21 NOTE — Op Note (Signed)
[facility] [address] [place], [address]   COLONOSCOPY PROCEDURE REPORT  PATIENT: Gerald Johnson, Gerald Johnson  MR#: [id number] BIRTHDATE: 1960/08/04 , 51  yrs. old GENDER: Male ENDOSCOPIST: Roxy Cedar, MD REFERRED NW:[facility] Carolynn Sayers, M.D. PROCEDURE DATE:  07/21/2012 PROCEDURE:   Colonoscopy, screening ASA CLASS:   Class II INDICATIONS:average risk screening. MEDICATIONS: MAC sedation, administered by CRNA and propofol (Diprivan) 400mg  IV  DESCRIPTION OF PROCEDURE:   After the risks benefits and alternatives of the procedure were thoroughly explained, informed consent was obtained.  A digital rectal exam revealed no abnormalities of the rectum.   The LB [id number]  endoscope was introduced through the anus and advanced to the cecum, which was identified by both the appendix and ileocecal valve. No adverse events experienced.   The quality of the prep was excellent, using MoviPrep  The instrument was then slowly withdrawn as the colon was fully examined.      COLON FINDINGS: Mild diverticulosis was noted in the sigmoid colon. The colon was otherwise normal.  There was no  inflammation, polyps or cancers .  Retroflexed views revealed no abnormalities. The time to cecum=2 minutes 55 seconds.  Withdrawal time=12 minutes 55 seconds.  The scope was withdrawn and the procedure completed.  COMPLICATIONS: There were no complications.  ENDOSCOPIC IMPRESSION: 1.   Mild diverticulosis was noted in the sigmoid colon 2.   The colon was otherwise normal  RECOMMENDATIONS: 1. Continue current colorectal screening recommendations for "routine risk" patients with a repeat colonoscopy in 10 years.   eSigned:  Roxy Cedar, MD 07/21/2012 2:23 PM   cc: Stacie Glaze, MD and The Patient

## 2012-07-21 NOTE — Patient Instructions (Addendum)
YOU HAD AN ENDOSCOPIC PROCEDURE TODAY AT THE [facility]: Refer to the procedure report that was given to you for any specific questions about what was found during the examination.  If the procedure report does not answer your questions, please call your gastroenterologist to clarify.  If you requested that your care partner not be given the details of your procedure findings, then the procedure report has been included in a sealed envelope for you to review at your convenience later.  YOU SHOULD EXPECT: Some feelings of bloating in the abdomen. Passage of more gas than usual.  Walking can help get rid of the air that was put into your GI tract during the procedure and reduce the bloating. If you had a lower endoscopy (such as a colonoscopy or flexible sigmoidoscopy) you may notice spotting of blood in your stool or on the toilet paper. If you underwent a bowel prep for your procedure, then you may not have a normal bowel movement for a few days.  DIET: Your first meal following the procedure should be a light meal and then it is ok to progress to your normal diet.  A half-sandwich or bowl of soup is an example of a good first meal.  Heavy or fried foods are harder to digest and may make you feel nauseous or bloated.  Likewise meals heavy in dairy and vegetables can cause extra gas to form and this can also increase the bloating.  Drink plenty of fluids but you should avoid alcoholic beverages for 24 hours.  ACTIVITY: Your care partner should take you home directly after the procedure.  You should plan to take it easy, moving slowly for the rest of the day.  You can resume normal activity the day after the procedure however you should NOT DRIVE or use heavy machinery for 24 hours (because of the sedation medicines used during the test).    SYMPTOMS TO REPORT IMMEDIATELY: A gastroenterologist can be reached at any hour.  During normal business hours, 8:30 AM to 5:00 PM Monday through Friday,  call [phone].  After hours and on weekends, please call the GI answering service at [phone] who will take a message and have the physician on call contact you.   Following lower endoscopy (colonoscopy or flexible sigmoidoscopy):  Excessive amounts of blood in the stool  Significant tenderness or worsening of abdominal pains  Swelling of the abdomen that is new, acute  Fever of 100F or higher  FOLLOW UP: If any biopsies were taken you will be contacted by phone or by letter within the next 1-3 weeks.  Call your gastroenterologist if you have not heard about the biopsies in 3 weeks.  Our staff will call the home number listed on your records the next business day following your procedure to check on you and address any questions or concerns that you may have at that time regarding the information given to you following your procedure. This is a courtesy call and so if there is no answer at the home number and we have not heard from you through the emergency physician on call, we will assume that you have returned to your regular daily activities without incident.  SIGNATURES/CONFIDENTIALITY: You and/or your care partner have signed paperwork which will be entered into your electronic medical record.  These signatures attest to the fact that that the information above on your After Visit Summary has been reviewed and is understood.  Full responsibility of the confidentiality of this   discharge information lies with you and/or your care-partner.  Diverticulosis-handout given  High Fiber Diet-handout given  Repeat colonoscopy in 10 years  

## 2012-07-22 ENCOUNTER — Telehealth: Payer: Self-pay | Admitting: *Deleted

## 2012-07-22 NOTE — Telephone Encounter (Signed)
Number identifier, left message, follow-up  

## 2012-08-06 ENCOUNTER — Other Ambulatory Visit: Payer: Self-pay | Admitting: Family Medicine

## 2012-09-10 ENCOUNTER — Ambulatory Visit: Payer: Self-pay | Admitting: Nurse Practitioner

## 2012-09-14 ENCOUNTER — Other Ambulatory Visit: Payer: Self-pay | Admitting: *Deleted

## 2012-09-14 MED ORDER — SUMATRIPTAN 20 MG/ACT NA SOLN: NASAL | Status: DC

## 2012-11-20 ENCOUNTER — Ambulatory Visit (INDEPENDENT_AMBULATORY_CARE_PROVIDER_SITE_OTHER): Payer: Managed Care, Other (non HMO) | Admitting: Family

## 2012-11-20 ENCOUNTER — Encounter: Payer: Self-pay | Admitting: Internal Medicine

## 2012-11-20 ENCOUNTER — Encounter: Payer: Self-pay | Admitting: Family

## 2012-11-20 VITALS — BP 122/90 | HR 78 | Wt 237.0 lb

## 2012-11-20 DIAGNOSIS — J309 Allergic rhinitis, unspecified: Secondary | ICD-10-CM

## 2012-11-20 DIAGNOSIS — R059 Cough, unspecified: Secondary | ICD-10-CM

## 2012-11-20 DIAGNOSIS — R05 Cough: Secondary | ICD-10-CM

## 2012-11-20 MED ORDER — MOMETASONE FUROATE 50 MCG/ACT NA SUSP: 2.0000 | Freq: Every day | NASAL | Status: DC

## 2012-11-20 NOTE — Patient Instructions (Addendum)
1. Zyrtec once daily and nasonex when symptoms first start.  Allergic Rhinitis Allergic rhinitis is when the mucous membranes in the nose respond to allergens. Allergens are particles in the air that cause your body to have an allergic reaction. This causes you to release allergic antibodies. Through a chain of events, these eventually cause you to release histamine into the blood stream (hence the use of antihistamines). Although meant to be protective to the body, it is this release that causes your discomfort, such as frequent sneezing, congestion and an itchy runny nose.  CAUSES  The pollen allergens may come from grasses, trees, and weeds. This is seasonal allergic rhinitis, or "hay fever." Other allergens cause year-round allergic rhinitis (perennial allergic rhinitis) such as house dust mite allergen, pet dander and mold spores.  SYMPTOMS   Nasal stuffiness (congestion).  Runny, itchy nose with sneezing and tearing of the eyes.  There is often an itching of the mouth, eyes and ears. It cannot be cured, but it can be controlled with medications. DIAGNOSIS  If you are unable to determine the offending allergen, skin or blood testing may find it. TREATMENT   Avoid the allergen.  Medications and allergy shots (immunotherapy) can help.  Hay fever may often be treated with antihistamines in pill or nasal spray forms. Antihistamines block the effects of histamine. There are over-the-counter medicines that may help with nasal congestion and swelling around the eyes. Check with your caregiver before taking or giving this medicine. If the treatment above does not work, there are many new medications your caregiver can prescribe. Stronger medications may be used if initial measures are ineffective. Desensitizing injections can be used if medications and avoidance fails. Desensitization is when a patient is given ongoing shots until the body becomes less sensitive to the allergen. Make sure you  follow up with your caregiver if problems continue. SEEK MEDICAL CARE IF:   You develop fever (more than 100.5 F (38.1 C).  You develop a cough that does not stop easily (persistent).  You have shortness of breath.  You start wheezing.  Symptoms interfere with normal daily activities. Document Released: 02/05/2001 Document Revised: 08/05/2011 Document Reviewed: 08/17/2008 [facility] Patient Information 2014 [place], [place].

## 2012-11-20 NOTE — Progress Notes (Signed)
Subjective:    Patient ID: Gerald Johnson, male    DOB: November 08, 1960, 52 y.o.   MRN: [id number]  HPI  52 year old AAM, nonsmoker is in today with c/o nasal congestion, cough, sinus pressure x 3 days. Has not been taking any medication for relief. Has had frequent flares of allergic rhinitis and sinusitis over the last year.   Review of Systems  Constitutional: Negative.   HENT: Positive for congestion and sneezing.   Eyes: Negative.   Respiratory: Positive for cough.   Cardiovascular: Negative.   Gastrointestinal: Negative.   Musculoskeletal: Negative.   Skin: Negative.   Allergic/Immunologic: Negative.   Neurological: Negative.   Hematological: Negative.   Psychiatric/Behavioral: Negative.    Past Medical History  Diagnosis Date  . Acute prostatitis 12/02/2007  . ALLERGIC RHINITIS 02/26/2007  . 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 HYPERLIPIDEMIA 02/26/2007  . Sciatica 05/04/2008  . SINUSITIS, ACUTE NOS 02/26/2007  . SINUSITIS, RECURRENT 02/28/2010  . Asthma   . Hemorrhoids     History   Social History  . Marital Status: Single    Spouse Name: N/A    Number of Children: N/A  . Years of Education: N/A   Occupational History  . Not on file.   Social History Main Topics  . Smoking status: Never Smoker   . Smokeless tobacco: Never Used  . Alcohol Use: 2.4 oz/week    4 Cans of beer per week  . Drug Use: No  . Sexually Active: Yes    Birth Control/ Protection: Condom   Other Topics Concern  . Not on file   Social History Narrative  . No narrative on file    Past Surgical History  Procedure Laterality Date  . Wisdom tooth extraction      Family History  Problem Relation Age of Onset  . Diabetes Brother   . Colon cancer Neg Hx   . Esophageal cancer Neg Hx   . Rectal cancer Neg Hx   . Stomach cancer Neg Hx     No Known Allergies  Current Outpatient Prescriptions on File Prior to Visit  Medication Sig Dispense Refill  .  Aspirin-Acetaminophen-Caffeine (EXCEDRIN PO) Take by mouth as needed.      . SUMAtriptan (IMITREX) 20 MG/ACT nasal spray One spray in the nostril, may repeat in 2 hours if headache persists.  1 Inhaler  3  . topiramate (TOPAMAX) 25 MG tablet daily.       Marland Kitchen VIAGRA 50 MG tablet TAKE 1 TABLET ONCE DAILY AS NEEDED FOR ED.  9 tablet  1   No current facility-administered medications on file prior to visit.    BP 122/90  Pulse 78  Wt 237 lb (107.502 kg)  BMI 30.42 kg/m2  SpO2 98%chart    Objective:   Physical Exam  Constitutional: He is oriented to person, place, and time. He appears well-developed and well-nourished.  HENT:  Right Ear: External ear normal.  Left Ear: External ear normal.  Mouth/Throat: Oropharynx is clear and moist.  Left nare congested.   Neck: Normal range of motion. Neck supple.  Cardiovascular: Normal rate, regular rhythm and normal heart sounds.   Pulmonary/Chest: Effort normal and breath sounds normal.  Abdominal: Soft. Bowel sounds are normal.  Neurological: He is alert and oriented to person, place, and time.  Skin: Skin is warm and dry.  Psychiatric: He has a normal mood and affect.          Assessment & Plan:  Assessment: 1.  Allergic Rhinitis 2. Cough  Plan:  Zyrtec over-the-counter once a day Nasonex 2 sprays in each nostril once a day. Call the office if symptoms worsen or persist. Recheck a schedule, and as needed.

## 2012-11-23 LAB — ~~LOC~~ ALLERGY PANEL
Allergen, D pternoyssinus,d7: 4.69 kU/L — ABNORMAL HIGH
Aspergillus fumigatus, m3: <0.1 kU/L
Bermuda Grass: 3.18 kU/L — ABNORMAL HIGH
Box Elder IgE: <0.1 kU/L
Common Ragweed: 0.95 kU/L — ABNORMAL HIGH
D. farinae: 4.42 kU/L — ABNORMAL HIGH
Dog Dander: 0.11 kU/L — ABNORMAL HIGH
Mucor Racemosus: <0.1 kU/L
Plantain: 0.12 kU/L — ABNORMAL HIGH
Rough Pigweed  IgE: <0.1 kU/L
Stemphylium Botryosum: <0.1 kU/L
Sweet Gum: <0.1 kU/L

## 2012-12-21 ENCOUNTER — Other Ambulatory Visit: Payer: Self-pay | Admitting: Internal Medicine

## 2013-03-08 ENCOUNTER — Encounter: Payer: Self-pay | Admitting: Family

## 2013-03-08 ENCOUNTER — Ambulatory Visit (INDEPENDENT_AMBULATORY_CARE_PROVIDER_SITE_OTHER): Payer: 59 | Admitting: Family

## 2013-03-08 VITALS — BP 130/86 | HR 90 | Temp 98.0°F | Wt 243.0 lb

## 2013-03-08 DIAGNOSIS — J309 Allergic rhinitis, unspecified: Secondary | ICD-10-CM

## 2013-03-08 DIAGNOSIS — G43909 Migraine, unspecified, not intractable, without status migrainosus: Secondary | ICD-10-CM

## 2013-03-08 MED ORDER — SUMATRIPTAN SUCCINATE 50 MG PO TABS: 50.0000 mg | ORAL_TABLET | ORAL | Status: DC | PRN

## 2013-03-08 NOTE — Progress Notes (Signed)
Subjective:    Patient ID: Gerald Johnson, male    DOB: August 07, 1960, 52 y.o.   MRN: [id number]  HPI 52 year old African American male, nonsmoker, patient of Dr. Lovell Sheehan, is in today with complaints of headache, sneezing, cough, congestion x1 week off and on. He has a history of migraine headaches. Headaches typically occur behind both eyes. He responds to Imitrex nasal. However, would like to switch to the tablet due to cost. Reports a headache a 6/10, sensitivity to light and noise. Has been referred to ENT in the past but reports they did nothing.  Review of Systems  Constitutional: Negative.   Respiratory: Negative.   Cardiovascular: Negative.   Endocrine: Negative.   Musculoskeletal: Negative.   Skin: Negative.   Allergic/Immunologic: Negative.   Neurological: Positive for headaches.  Psychiatric/Behavioral: Negative.    Past Medical History  Diagnosis Date  . Acute prostatitis 12/02/2007  . ALLERGIC RHINITIS 02/26/2007  . 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 HYPERLIPIDEMIA 02/26/2007  . Sciatica 05/04/2008  . SINUSITIS, ACUTE NOS 02/26/2007  . SINUSITIS, RECURRENT 02/28/2010  . Asthma   . Hemorrhoids     History   Social History  . Marital Status: Single    Spouse Name: N/A    Number of Children: N/A  . Years of Education: N/A   Occupational History  . Not on file.   Social History Main Topics  . Smoking status: Never Smoker   . Smokeless tobacco: Never Used  . Alcohol Use: 2.4 oz/week    4 Cans of beer per week  . Drug Use: No  . Sexual Activity: Yes    Birth Control/ Protection: Condom   Other Topics Concern  . Not on file   Social History Narrative  . No narrative on file    Past Surgical History  Procedure Laterality Date  . Wisdom tooth extraction      Family History  Problem Relation Age of Onset  . Diabetes Brother   . Colon cancer Neg Hx   . Esophageal cancer Neg Hx   . Rectal cancer Neg Hx   . Stomach cancer Neg Hx     No  Known Allergies  Current Outpatient Prescriptions on File Prior to Visit  Medication Sig Dispense Refill  . Aspirin-Acetaminophen-Caffeine (EXCEDRIN PO) Take by mouth as needed.      . mometasone (NASONEX) 50 MCG/ACT nasal spray Place 2 sprays into the nose daily.  17 g  12  . VIAGRA 50 MG tablet TAKE 1 TABLET ONCE DAILY AS NEEDED FOR ED.  9 tablet  1   No current facility-administered medications on file prior to visit.    BP 130/86  Pulse 90  Temp(Src) 98 F (36.7 C) (Oral)  Wt 243 lb (110.224 kg)  BMI 31.19 kg/m2  SpO2 98%chart    Objective:   Physical Exam  Constitutional: He is oriented to person, place, and time. He appears well-developed and well-nourished.  HENT:  Right Ear: External ear normal.  Left Ear: External ear normal.  Nose: Nose normal.  Mouth/Throat: Oropharynx is clear and moist.  Eyes: Conjunctivae are normal. Pupils are equal, round, and reactive to light.  Neck: Normal range of motion. Neck supple.  Cardiovascular: Normal rate, regular rhythm and normal heart sounds.   Pulmonary/Chest: Effort normal and breath sounds normal.  Musculoskeletal: Normal range of motion.  Neurological: He is alert and oriented to person, place, and time. He has normal reflexes. He displays normal reflexes. No cranial nerve deficit. Coordination normal.  Skin: Skin is warm and dry.  Psychiatric: He has a normal mood and affect.          Assessment & Plan:  Assessment: 1. Migraine headaches 2. Allergic rhinitis  Plan: Continue Nasonex 2 sprays in each nostril once a day. Imitrex 50 mg one tablet at the onset of the headache may repeat 2 hours later. Decrease caffeine intake. Patient to call the office if any questions or concerns. Recheck as scheduled, and as needed.

## 2013-03-08 NOTE — Patient Instructions (Addendum)
1. Take Imitrex. 1 tab at the onset of the headache, may repeat in 2 hours. Max 2 tabs in 24 hours.   Migraine Headache A migraine headache is an intense, throbbing pain on one or both sides of your head. A migraine can last for 30 minutes to several hours. CAUSES  The exact cause of a migraine headache is not always known. However, a migraine may be caused when nerves in the brain become irritated and release chemicals that cause inflammation. This causes pain. SYMPTOMS  Pain on one or both sides of your head.  Pulsating or throbbing pain.  Severe pain that prevents daily activities.  Pain that is aggravated by any physical activity.  Nausea, vomiting, or both.  Dizziness.  Pain with exposure to bright lights, loud noises, or activity.  General sensitivity to bright lights, loud noises, or smells. Before you get a migraine, you may get warning signs that a migraine is coming (aura). An aura may include:  Seeing flashing lights.  Seeing bright spots, halos, or zig-zag lines.  Having tunnel vision or blurred vision.  Having feelings of numbness or tingling.  Having trouble talking.  Having muscle weakness. MIGRAINE TRIGGERS  Alcohol.  Smoking.  Stress.  Menstruation.  Aged cheeses.  Foods or drinks that contain nitrates, glutamate, aspartame, or tyramine.  Lack of sleep.  Chocolate.  Caffeine.  Hunger.  Physical exertion.  Fatigue.  Medicines used to treat chest pain (nitroglycerine), birth control pills, estrogen, and some blood pressure medicines. DIAGNOSIS  A migraine headache is often diagnosed based on:  Symptoms.  Physical examination.  A CT scan or MRI of your head. TREATMENT Medicines may be given for pain and nausea. Medicines can also be given to help prevent recurrent migraines.  HOME CARE INSTRUCTIONS  Only take over-the-counter or prescription medicines for pain or discomfort as directed by your caregiver. The use of long-term  narcotics is not recommended.  Lie down in a dark, quiet room when you have a migraine.  Keep a journal to find out what may trigger your migraine headaches. For example, write down:  What you eat and drink.  How much sleep you get.  Any change to your diet or medicines.  Limit alcohol consumption.  Quit smoking if you smoke.  Get 7 to 9 hours of sleep, or as recommended by your caregiver.  Limit stress.  Keep lights dim if bright lights bother you and make your migraines worse. SEEK IMMEDIATE MEDICAL CARE IF:   Your migraine becomes severe.  You have a fever.  You have a stiff neck.  You have vision loss.  You have muscular weakness or loss of muscle control.  You start losing your balance or have trouble walking.  You feel faint or pass out.  You have severe symptoms that are different from your first symptoms. MAKE SURE YOU:   Understand these instructions.  Will watch your condition.  Will get help right away if you are not doing well or get worse. Document Released: 05/13/2005 Document Revised: 08/05/2011 Document Reviewed: 05/03/2011 [facility] Patient Information 2014 [place], [place].

## 2013-04-13 ENCOUNTER — Ambulatory Visit (INDEPENDENT_AMBULATORY_CARE_PROVIDER_SITE_OTHER): Payer: 59 | Admitting: Family

## 2013-04-13 ENCOUNTER — Other Ambulatory Visit: Payer: Self-pay | Admitting: *Deleted

## 2013-04-13 ENCOUNTER — Encounter: Payer: Self-pay | Admitting: Family

## 2013-04-13 DIAGNOSIS — Z23 Encounter for immunization: Secondary | ICD-10-CM

## 2013-04-13 DIAGNOSIS — M19079 Primary osteoarthritis, unspecified ankle and foot: Secondary | ICD-10-CM

## 2013-04-13 MED ORDER — KETOROLAC TROMETHAMINE 60 MG/2ML IM SOLN
60.0000 mg | Freq: Once | INTRAMUSCULAR | Status: AC
  Administered 2013-04-13: 60 mg via INTRAMUSCULAR

## 2013-04-13 MED ORDER — SUMATRIPTAN SUCCINATE 50 MG PO TABS: 50.0000 mg | ORAL_TABLET | ORAL | Status: DC | PRN

## 2013-04-13 NOTE — Progress Notes (Signed)
Subjective:    Patient ID: Gerald Johnson, male    DOB: Jul 30, 1960, 52 y.o.   MRN: [id number]  HPI 52 year old African American male, nonsmoker, patient of Dr. Lovell Sheehan in today with complaints of left foot pain x4-5 days with no acute injury. Rate the pain a 3/10 today but was worse yesterday and 8/10. Denies any swelling or redness. Took Advil yesterday that helped.  Has a history of migraine headaches and is requesting a refill on Imitrex. Headaches stable.   Review of Systems  Constitutional: Negative.   Respiratory: Negative.   Cardiovascular: Negative.   Genitourinary: Negative.   Musculoskeletal: Positive for arthralgias.       Left foot pain around the big toe.   Skin: Negative.   Neurological: Negative.   Psychiatric/Behavioral: Negative.    Past Medical History  Diagnosis Date  . Acute prostatitis 12/02/2007  . ALLERGIC RHINITIS 02/26/2007  . 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 HYPERLIPIDEMIA 02/26/2007  . Sciatica 05/04/2008  . SINUSITIS, ACUTE NOS 02/26/2007  . SINUSITIS, RECURRENT 02/28/2010  . Asthma   . Hemorrhoids     History   Social History  . Marital Status: Single    Spouse Name: N/A    Number of Children: N/A  . Years of Education: N/A   Occupational History  . Not on file.   Social History Main Topics  . Smoking status: Never Smoker   . Smokeless tobacco: Never Used  . Alcohol Use: 2.4 oz/week    4 Cans of beer per week  . Drug Use: No  . Sexual Activity: Yes    Birth Control/ Protection: Condom   Other Topics Concern  . Not on file   Social History Narrative  . No narrative on file    Past Surgical History  Procedure Laterality Date  . Wisdom tooth extraction      Family History  Problem Relation Age of Onset  . Diabetes Brother   . Colon cancer Neg Hx   . Esophageal cancer Neg Hx   . Rectal cancer Neg Hx   . Stomach cancer Neg Hx     No Known Allergies  Current Outpatient Prescriptions on File Prior to Visit   Medication Sig Dispense Refill  . Aspirin-Acetaminophen-Caffeine (EXCEDRIN PO) Take by mouth as needed.      . mometasone (NASONEX) 50 MCG/ACT nasal spray Place 2 sprays into the nose daily.  17 g  12  . VIAGRA 50 MG tablet TAKE 1 TABLET ONCE DAILY AS NEEDED FOR ED.  9 tablet  1   No current facility-administered medications on file prior to visit.    BP 128/90  Pulse 68  Wt 235 lb (106.595 kg)chart    Objective:   Physical Exam  Constitutional: He is oriented to person, place, and time. He appears well-developed and well-nourished.  Neck: Normal range of motion. Neck supple.  Cardiovascular: Normal rate, regular rhythm and normal heart sounds.   Pulmonary/Chest: Effort normal and breath sounds normal.  Musculoskeletal: He exhibits tenderness. He exhibits no edema.  Tenderness to palpation of the left great toe proximally. No swelling or redness.   Neurological: He is alert and oriented to person, place, and time.  Skin: Skin is warm and dry.  Psychiatric: He has a normal mood and affect.          Assessment & Plan:  Assessment: 1. Left foot osteoarthritis 2. History of migraine headaches  Plan: Continue Aleve. Refilled Imitrex. Toradol 60 mg IM given today. Call  the office with any questions or concerns. Recheck as scheduled, and as needed.

## 2013-04-13 NOTE — Patient Instructions (Signed)
Osteoarthritis Osteoarthritis is the most common form of arthritis. It is redness, soreness, and swelling (inflammation) affecting the cartilage. Cartilage acts as a cushion, covering the ends of bones where they meet to form a joint. CAUSES  Over time, the cartilage begins to wear away. This causes bone to rub on bone. This produces pain and stiffness in the affected joints. Factors that contribute to this problem are:  Excessive body weight.  Age.  Overuse of joints. SYMPTOMS   People with osteoarthritis usually experience joint pain, swelling, or stiffness.  Over time, the joint may lose its normal shape.  Small deposits of bone (osteophytes) may grow on the edges of the joint.  Bits of bone or cartilage can break off and float inside the joint space. This may cause more pain and damage.  Osteoarthritis can lead to depression, anxiety, feelings of helplessness, and limitations on daily activities. The most commonly affected joints are in the:  Ends of the fingers.  Thumbs.  Neck.  Lower back.  Knees.  Hips. DIAGNOSIS  Diagnosis is mostly based on your symptoms and exam. Tests may be helpful, including:  X-rays of the affected joint.  A computerized magnetic scan (MRI).  Blood tests to rule out other types of arthritis.  Joint fluid tests. This involves using a needle to draw fluid from the joint and examining the fluid under a microscope. TREATMENT  Goals of treatment are to control pain, improve joint function, maintain a normal body weight, and maintain a healthy lifestyle. Treatment approaches may include:  A prescribed exercise program with rest and joint relief.  Weight control with nutritional education.  Pain relief techniques such as:  Properly applied heat and cold.  Electric pulses delivered to nerve endings under the skin (transcutaneous electrical nerve stimulation, TENS).  Massage.  Certain supplements. Ask your caregiver before using any  supplements, especially in combination with prescribed drugs.  Medicines to control pain, such as:  Acetaminophen.  Nonsteroidal anti-inflammatory drugs (NSAIDs), such as naproxen.  Narcotic or central-acting agents, such as tramadol. This drug carries a risk of addiction and is generally prescribed for short-term use.  Corticosteroids. These can be given orally or as injection. This is a short-term treatment, not recommended for routine use.  Surgery to reposition the bones and relieve pain (osteotomy) or to remove loose pieces of bone and cartilage. Joint replacement may be needed in advanced states of osteoarthritis. HOME CARE INSTRUCTIONS  Your caregiver can recommend specific types of exercise. These may include:  Strengthening exercises. These are done to strengthen the muscles that support joints affected by arthritis. They can be performed with weights or with exercise bands to add resistance.  Aerobic activities. These are exercises, such as brisk walking or low-impact aerobics, that get your heart pumping. They can help keep your lungs and circulatory system in shape.  Range-of-motion activities. These keep your joints limber.  Balance and agility exercises. These help you maintain daily living skills. Learning about your condition and being actively involved in your care will help improve the course of your osteoarthritis. SEEK MEDICAL CARE IF:   You feel hot or your skin turns red.  You develop a rash in addition to your joint pain.  You have an oral temperature above 102 F (38.9 C). FOR MORE INFORMATION  National Institute of Arthritis and Musculoskeletal and Skin Diseases: www.niams.nih.gov National Institute on Aging: www.nia.nih.gov American College of Rheumatology: www.rheumatology.org Document Released: 05/13/2005 Document Revised: 08/05/2011 Document Reviewed: 08/24/2009 [employer] Patient Information 2014 [employer].  

## 2013-05-07 ENCOUNTER — Ambulatory Visit: Payer: Self-pay | Admitting: Podiatrist

## 2013-05-22 ENCOUNTER — Ambulatory Visit (INDEPENDENT_AMBULATORY_CARE_PROVIDER_SITE_OTHER): Payer: 59 | Admitting: Internal Medicine

## 2013-05-22 ENCOUNTER — Encounter: Payer: Self-pay | Admitting: Internal Medicine

## 2013-05-22 VITALS — BP 110/72 | HR 96 | Temp 97.9°F | Ht 75.0 in | Wt 248.2 lb

## 2013-05-22 DIAGNOSIS — J309 Allergic rhinitis, unspecified: Secondary | ICD-10-CM

## 2013-05-22 DIAGNOSIS — R519 Headache, unspecified: Secondary | ICD-10-CM

## 2013-05-22 DIAGNOSIS — J019 Acute sinusitis, unspecified: Secondary | ICD-10-CM

## 2013-05-22 DIAGNOSIS — R51 Headache: Secondary | ICD-10-CM

## 2013-05-22 MED ORDER — LEVOFLOXACIN 500 MG PO TABS: 500.0000 mg | ORAL_TABLET | Freq: Every day | ORAL | Status: DC

## 2013-05-22 MED ORDER — METHYLPREDNISOLONE ACETATE 80 MG/ML IJ SUSP
80.0000 mg | Freq: Once | INTRAMUSCULAR | Status: AC
  Administered 2013-05-22: 80 mg via INTRAMUSCULAR

## 2013-05-22 MED ORDER — FLUTICASONE PROPIONATE 50 MCG/ACT NA SUSP: 2.0000 | Freq: Every day | NASAL | Status: DC

## 2013-05-22 NOTE — Assessment & Plan Note (Signed)
Mild to mod, for depomeddrol IM, then change nasonex to flonase due to cost,  to f/u any worsening symptoms or concerns, for prn allegra otc as well

## 2013-05-22 NOTE — Patient Instructions (Signed)
You had the steroid shot today Please take all new medication as prescribed - the antibiotic, and flonase generic (instead of the nasonex) You can also try allegra OTC as needed for allergies Please continue all other medications as before  Please remember to sign up for My Chart if you have not done so, as this will be important to you in the future with finding out test results, communicating by private email, and scheduling acute appointments online when needed.

## 2013-05-22 NOTE — Assessment & Plan Note (Signed)
Exam o/w benign, doubt needs CT head, for tx as above,  to f/u any worsening symptoms or concerns

## 2013-05-22 NOTE — Assessment & Plan Note (Signed)
Mild to mod, for antibx course,  to f/u any worsening symptoms or concerns 

## 2013-05-22 NOTE — Progress Notes (Signed)
Subjective:    Patient ID: Gerald Johnson, male    DOB: 03-01-1961, 52 y.o.   MRN: [id number]  HPI   Here with 2-3 days acute onset fever, facial pain, pressure, headache, general weakness and malaise, and greenish d/c, with mild ST and cough, but pt denies chest pain, wheezing, increased sob or doe, orthopnea, PND, increased LE swelling, palpitations, dizziness or syncope. Does have several wks ongoing nasal allergy symptoms with clearish congestion, itch and sneezing, without fever, pain, ST, cough, swelling or wheezing.  Does have HA behind the eye but only with the concurrent ear and sinus symtpoms Past Medical History  Diagnosis Date  . Acute prostatitis 12/02/2007  . ALLERGIC RHINITIS 02/26/2007  . 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 HYPERLIPIDEMIA 02/26/2007  . Sciatica 05/04/2008  . SINUSITIS, ACUTE NOS 02/26/2007  . SINUSITIS, RECURRENT 02/28/2010  . Asthma   . Hemorrhoids    Past Surgical History  Procedure Laterality Date  . Wisdom tooth extraction      reports that he has never smoked. He has never used smokeless tobacco. He reports that he drinks about 2.4 ounces of alcohol per week. He reports that he does not use illicit drugs. family history includes Diabetes in his brother. There is no history of Colon cancer, Esophageal cancer, Rectal cancer, or Stomach cancer. No Known Allergies Current Outpatient Prescriptions on File Prior to Visit  Medication Sig Dispense Refill  . Aspirin-Acetaminophen-Caffeine (EXCEDRIN PO) Take by mouth as needed.      . mometasone (NASONEX) 50 MCG/ACT nasal spray Place 2 sprays into the nose daily.  17 g  12  . SUMAtriptan (IMITREX) 50 MG tablet Take 1 tablet (50 mg total) by mouth every 2 (two) hours as needed for migraine. May repeat in 2 hours if headache persists or recurs.  10 tablet  4  . VIAGRA 50 MG tablet TAKE 1 TABLET ONCE DAILY AS NEEDED FOR ED.  9 tablet  1   No current facility-administered medications on file prior to  visit.   Review of Systems  Constitutional: Negative for unexpected weight change, or unusual diaphoresis  HENT: Negative for tinnitus.   Eyes: Negative for photophobia and visual disturbance.  Respiratory: Negative for choking and stridor.   Gastrointestinal: Negative for vomiting and blood in stool.  Genitourinary: Negative for hematuria and decreased urine volume.  Musculoskeletal: Negative for acute joint swelling Skin: Negative for color change and wound.  Neurological: Negative for tremors and numbness other than noted  Psychiatric/Behavioral: Negative for decreased concentration or  hyperactivity.       Objective:   Physical Exam BP 110/72  Pulse 96  Temp(Src) 97.9 F (36.6 C) (Oral)  Ht 6\' 3"  (1.905 m)  Wt 248 lb 4 oz (112.605 kg)  BMI 31.03 kg/m2  SpO2 95% VS noted, mild ill Constitutional: Pt appears well-developed and well-nourished.  HENT: Head: NCAT.  Right Ear: External ear normal.  Left Ear: External ear normal.  Bilat tm's with mild erythema.  Max sinus areas mild tender.  Pharynx with mild erythema, no exudate Eyes: Conjunctivae and EOM are normal. Pupils are equal, round, and reactive to light.  Neck: Normal range of motion. Neck supple.  Cardiovascular: Normal rate and regular rhythm.   Pulmonary/Chest: Effort normal and breath sounds normal.  Neurological: Pt is alert. Not confused , cn 2-12 intact, motor 5/5, fundi not able to see well Skin: Skin is warm. No erythema.  Psychiatric: Pt behavior is normal. Thought content normal.  Assessment & Plan:

## 2013-05-22 NOTE — Progress Notes (Signed)
Pre-visit discussion using our clinic review tool. No additional management support is needed unless otherwise documented below in the visit note.  

## 2013-08-04 ENCOUNTER — Other Ambulatory Visit: Payer: Self-pay | Admitting: Family

## 2013-09-06 ENCOUNTER — Ambulatory Visit: Payer: 59 | Admitting: Family

## 2013-09-06 DIAGNOSIS — Z0289 Encounter for other administrative examinations: Secondary | ICD-10-CM

## 2013-09-10 ENCOUNTER — Other Ambulatory Visit: Payer: Self-pay | Admitting: Family

## 2013-09-20 ENCOUNTER — Encounter: Payer: Self-pay | Admitting: Family

## 2013-09-20 ENCOUNTER — Ambulatory Visit (INDEPENDENT_AMBULATORY_CARE_PROVIDER_SITE_OTHER): Payer: 59 | Admitting: Family

## 2013-09-20 VITALS — BP 118/84 | HR 78 | Temp 98.2°F | Wt 246.0 lb

## 2013-09-20 DIAGNOSIS — J329 Chronic sinusitis, unspecified: Secondary | ICD-10-CM

## 2013-09-20 DIAGNOSIS — J301 Allergic rhinitis due to pollen: Secondary | ICD-10-CM

## 2013-09-20 DIAGNOSIS — R0982 Postnasal drip: Secondary | ICD-10-CM

## 2013-09-20 DIAGNOSIS — J309 Allergic rhinitis, unspecified: Secondary | ICD-10-CM

## 2013-09-20 MED ORDER — METHYLPREDNISOLONE ACETATE 40 MG/ML IJ SUSP
80.0000 mg | Freq: Once | INTRAMUSCULAR | Status: AC
  Administered 2013-09-20: 80 mg via INTRAMUSCULAR

## 2013-09-20 MED ORDER — METHYLPREDNISOLONE 4 MG PO KIT: PACK | ORAL | Status: AC

## 2013-09-20 NOTE — Progress Notes (Signed)
Subjective:    Patient ID: Gerald Johnson, male    DOB: [birth year], 53 y.o.   MRN: [id number]  HPI  53 year old AAM, nonsmoker, is in today with complaints of sinus pressure and congestion x2 days. Denies any sneezing or coughing. Has a history of allergic rhinitis. Has been taking Excedrin helps. Has previously been referred to an allergist but has not seen them. Has not been taking Flonase.   Review of Systems  Constitutional: Negative.   Respiratory: Negative for wheezing.   Cardiovascular: Negative.   Endocrine: Negative.   Musculoskeletal: Negative.   Skin: Negative.   Allergic/Immunologic: Positive for environmental allergies. Negative for food allergies and immunocompromised state.  Psychiatric/Behavioral: Negative.    Past Medical History  Diagnosis Date  . Acute prostatitis 12/02/2007  . ALLERGIC RHINITIS 02/26/2007  . 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 HYPERLIPIDEMIA 02/26/2007  . Sciatica 05/04/2008  . SINUSITIS, ACUTE NOS 02/26/2007  . SINUSITIS, RECURRENT 02/28/2010  . Asthma   . Hemorrhoids     History   Social History  . Marital Status: Single    Spouse Name: N/A    Number of Children: N/A  . Years of Education: N/A   Occupational History  . Not on file.   Social History Main Topics  . Smoking status: Never Smoker   . Smokeless tobacco: Never Used  . Alcohol Use: 2.4 oz/week    4 Cans of beer per week  . Drug Use: No  . Sexual Activity: Yes    Birth Control/ Protection: Condom   Other Topics Concern  . Not on file   Social History Narrative  . No narrative on file    Past Surgical History  Procedure Laterality Date  . Wisdom tooth extraction      Family History  Problem Relation Age of Onset  . Diabetes Brother   . Colon cancer Neg Hx   . Esophageal cancer Neg Hx   . Rectal cancer Neg Hx   . Stomach cancer Neg Hx     No Known Allergies  Current Outpatient Prescriptions on File Prior to Visit  Medication Sig  Dispense Refill  . Aspirin-Acetaminophen-Caffeine (EXCEDRIN PO) Take by mouth as needed.      . SUMAtriptan (IMITREX) 50 MG tablet Take 1 tablet (50 mg total) by mouth every 2 (two) hours as needed for migraine. May repeat in 2 hours if headache persists or recurs.  10 tablet  4  . VIAGRA 50 MG tablet TAKE 1 TABLET ONCE DAILY AS NEEDED FOR ED.  9 tablet  1   No current facility-administered medications on file prior to visit.    BP 118/84  Pulse 78  Temp(Src) 98.2 F (36.8 C) (Oral)  Wt 246 lb (111.585 kg)  SpO2 98%chart    Objective:   Physical Exam  Constitutional: He is oriented to person, place, and time. He appears well-developed and well-nourished.  HENT:  Right Ear: External ear normal.  Left Ear: External ear normal.  Nose: Nose normal.  Mouth/Throat: Oropharynx is clear and moist.  Neck: Normal range of motion. Neck supple.  Cardiovascular: Normal rate, regular rhythm and normal heart sounds.   Pulmonary/Chest: Effort normal and breath sounds normal.  Musculoskeletal: Normal range of motion.  Neurological: He is alert and oriented to person, place, and time.  Skin: Skin is warm and dry.  Psychiatric: He has a normal mood and affect.          Assessment & Plan:  Dellis Filbert  was seen today for sinusitis.  Diagnoses and associated orders for this visit:  Hay fever - methylPREDNISolone acetate (DEPO-MEDROL) injection 80 mg; Inject 2 mLs (80 mg total) into the muscle once.  Other Orders - methylPREDNISolone (MEDROL DOSEPAK) 4 MG tablet; follow package directions   Call the office with any questions or concerns. Recheck as scheduled and as needed.

## 2013-09-20 NOTE — Patient Instructions (Signed)
Hay Fever Hay fever is an allergic reaction to particles in the air. It cannot be passed from person to person. It cannot be cured, but it can be controlled. CAUSES  Hay fever is caused by something that triggers an allergic reaction (allergens). The following are examples of allergens:  Ragweed.  Feathers.  Animal dander.  Grass and tree pollens.  Cigarette smoke.  House dust.  Pollution. SYMPTOMS   Sneezing.  Runny or stuffy nose.  Tearing eyes.  Itchy eyes, nose, mouth, throat, skin, or other area.  Sore throat.  Headache.  Decreased sense of smell or taste. DIAGNOSIS Your caregiver will perform a physical exam and ask questions about the symptoms you are having.Allergy testing may be done to determine exactly what triggers your hay fever.  TREATMENT   Over-the-counter medicines may help symptoms. These include:  Antihistamines.  Decongestants. These may help with nasal congestion.  Your caregiver may prescribe medicines if over-the-counter medicines do not work.  Some people benefit from allergy shots when other medicines are not helpful. HOME CARE INSTRUCTIONS   Avoid the allergen that is causing your symptoms, if possible.  Take all medicine as told by your caregiver. SEEK MEDICAL CARE IF:   You have severe allergy symptoms and your current medicines are not helping.  Your treatment was working at one time, but you are now experiencing symptoms.  You have sinus congestion and pressure.  You develop a fever or headache.  You have thick nasal discharge.  You have asthma and have a worsening cough and wheezing. SEEK IMMEDIATE MEDICAL CARE IF:   You have swelling of your tongue or lips.  You have trouble breathing.  You feel lightheaded or like you are going to faint.  You have cold sweats.  You have a fever. Document Released: 05/13/2005 Document Revised: 08/05/2011 Document Reviewed: 08/08/2010 [employer] Patient Information 2014  [employer].  

## 2013-10-11 ENCOUNTER — Other Ambulatory Visit: Payer: Self-pay | Admitting: Family

## 2013-11-16 ENCOUNTER — Ambulatory Visit (INDEPENDENT_AMBULATORY_CARE_PROVIDER_SITE_OTHER): Payer: 59 | Admitting: Family Medicine

## 2013-11-16 ENCOUNTER — Encounter: Payer: Self-pay | Admitting: Family Medicine

## 2013-11-16 VITALS — BP 120/78 | HR 87 | Temp 98.2°F | Ht 75.0 in | Wt 243.0 lb

## 2013-11-16 DIAGNOSIS — J309 Allergic rhinitis, unspecified: Secondary | ICD-10-CM

## 2013-11-16 DIAGNOSIS — J019 Acute sinusitis, unspecified: Secondary | ICD-10-CM

## 2013-11-16 MED ORDER — DOXYCYCLINE HYCLATE 100 MG PO CAPS: 100.0000 mg | ORAL_CAPSULE | Freq: Two times a day (BID) | ORAL | Status: DC

## 2013-11-16 NOTE — Progress Notes (Signed)
Pre visit review using our clinic review tool, if applicable. No additional management support is needed unless otherwise documented below in the visit note. 

## 2013-11-16 NOTE — Progress Notes (Signed)
No chief complaint on file.   HPI:   -started: 2 days ago -symptoms:nasal congestion, sore throat, cough, sinus pressure/pain -denies:fever, SOB, NVD, tooth pain -has tried: nothing -sick contacts/travel/risks: denies flu exposure, tick exposure or or Ebola risks -Hx of: allergies ROS: See pertinent positives and negatives per HPI.  Past Medical History  Diagnosis Date  . Acute prostatitis 12/02/2007  . ALLERGIC RHINITIS 02/26/2007  . 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 HYPERLIPIDEMIA 02/26/2007  . Sciatica 05/04/2008  . SINUSITIS, ACUTE NOS 02/26/2007  . SINUSITIS, RECURRENT 02/28/2010  . Asthma   . Hemorrhoids     Past Surgical History  Procedure Laterality Date  . Wisdom tooth extraction      Family History  Problem Relation Age of Onset  . Diabetes Brother   . Colon cancer Neg Hx   . Esophageal cancer Neg Hx   . Rectal cancer Neg Hx   . Stomach cancer Neg Hx     History   Social History  . Marital Status: Single    Spouse Name: N/A    Number of Children: N/A  . Years of Education: N/A   Social History Main Topics  . Smoking status: Never Smoker   . Smokeless tobacco: Never Used  . Alcohol Use: 2.4 oz/week    4 Cans of beer per week  . Drug Use: No  . Sexual Activity: Yes    Birth Control/ Protection: Condom   Other Topics Concern  . None   Social History Narrative  . None    Current outpatient prescriptions:Aspirin-Acetaminophen-Caffeine (EXCEDRIN PO), Take by mouth as needed., Disp: , Rfl: ;  SUMAtriptan (IMITREX) 50 MG tablet, Take 1 tablet (50 mg total) by mouth every 2 (two) hours as needed for migraine. May repeat in 2 hours if headache persists or recurs., Disp: 10 tablet, Rfl: 4;  doxycycline (VIBRAMYCIN) 100 MG capsule, Take 1 capsule (100 mg total) by mouth 2 (two) times daily., Disp: 20 capsule, Rfl: 0  EXAM:  Filed Vitals:   11/16/13 1403  BP: 120/78  Pulse: 87  Temp: 98.2 F (36.8 C)    Body mass index is 30.37  kg/(m^2).  GENERAL: vitals reviewed and listed above, alert, oriented, appears well hydrated and in no acute distress  HEENT: atraumatic, conjunttiva clear, no obvious abnormalities on inspection of external nose and ears, normal appearance of ear canals and TMs, clear nasal congestion, mild post oropharyngeal erythema with PND, no tonsillar edema or exudate, no sinus TTP  NECK: no obvious masses on inspection  LUNGS: clear to auscultation bilaterally, no wheezes, rales or rhonchi, good air movement  CV: HRRR, no peripheral edema  MS: moves all extremities without noticeable abnormality  PSYCH: pleasant and cooperative, no obvious depression or anxiety  ASSESSMENT AND PLAN:  Discussed the following assessment and plan:  ALLERGIC RHINITIS  SINUSITIS, ACUTE NOS - Plan: doxycycline (VIBRAMYCIN) 100 MG capsule  -given HPI and exam findings today, a serious infection or illness is unlikely. We discussed potential etiologies, with AR vs VURI being most likely, and advised supportive care and monitoring. We discussed treatment side effects, likely course, antibiotic misuse, transmission, and signs of developing a serious illness. -abx if worsening or not improving with use and return precuations and risks discussed -of course, we advised to return or notify a doctor immediately if symptoms worsen or persist or new concerns arise.    There are no Patient Instructions on file for this visit.   Colin Benton R.

## 2013-11-16 NOTE — Patient Instructions (Signed)
-  AFRIN twice daily for 4 days  -nasocort and claritin daily for 1 month  -antibiotic if worsening or not improving and see your doctor

## 2013-12-04 ENCOUNTER — Ambulatory Visit (INDEPENDENT_AMBULATORY_CARE_PROVIDER_SITE_OTHER): Payer: 59 | Admitting: Family Medicine

## 2013-12-04 VITALS — BP 130/88 | HR 83 | Temp 98.1°F | Resp 16 | Ht 73.25 in | Wt 239.6 lb

## 2013-12-04 DIAGNOSIS — H109 Unspecified conjunctivitis: Secondary | ICD-10-CM

## 2013-12-04 DIAGNOSIS — H5711 Ocular pain, right eye: Secondary | ICD-10-CM

## 2013-12-04 DIAGNOSIS — H5710 Ocular pain, unspecified eye: Secondary | ICD-10-CM

## 2013-12-04 MED ORDER — OFLOXACIN 0.3 % OP SOLN: OPHTHALMIC | Status: DC

## 2013-12-04 NOTE — Patient Instructions (Addendum)
Use the eyedrops 1-2 drops in eye every 3 hours today, then 4 times daily beginning tomorrow. Use an occasional drop in the left eye also to try and keep it from getting involved.  Good handwashing  Return if in all worse or if not improving over the next couple of days  Conjunctivitis Conjunctivitis is commonly called "pink eye." Conjunctivitis can be caused by bacterial or viral infection, allergies, or injuries. There is usually redness of the lining of the eye, itching, discomfort, and sometimes discharge. There may be deposits of matter along the eyelids. A viral infection usually causes a watery discharge, while a bacterial infection causes a yellowish, thick discharge. Pink eye is very contagious and spreads by direct contact. You may be given antibiotic eyedrops as part of your treatment. Before using your eye medicine, remove all drainage from the eye by washing gently with warm water and cotton balls. Continue to use the medication until you have awakened 2 mornings in a row without discharge from the eye. Do not rub your eye. This increases the irritation and helps spread infection. Use separate towels from other household members. Wash your hands with soap and water before and after touching your eyes. Use cold compresses to reduce pain and sunglasses to relieve irritation from light. Do not wear contact lenses or wear eye makeup until the infection is gone. SEEK MEDICAL CARE IF:   Your symptoms are not better after 3 days of treatment.  You have increased pain or trouble seeing.  The outer eyelids become very red or swollen. Document Released: 06/20/2004 Document Revised: 08/05/2011 Document Reviewed: 05/13/2005 [facility] Patient Information 2015 [place], [place]. This information is not intended to replace advice given to you by your health care provider. Make sure you discuss any questions you have with your health care provider.

## 2013-12-04 NOTE — Progress Notes (Signed)
Subjective: 53 year old man who was fine yesterday when he went to bed. When he got up in the night to the bathroom he is fine. This morning when he got up he has right eye was painful, tearing, when he tried to open it. He has had intermittently some sinus symptoms he says. He does not know of exposure to anybody with conjunctivitis. He does not have any little kids around him. He lives alone. His job does not entail working a lot of dust or debris.  Objective: Uncomfortable. He is right eye is tearing badly and photophobic. He keeps it most of the way closed. He does wear glasses. Eyes mild to moderately injected. No corneal abrasions could be seen grossly. The pupil appears normal, reacts well, clear. Difficult to get a good view of the fundus because of the tearing and drainage.  Assessment:  Acute conjunctivitis  Plan: Ocuflox If worse at any time he is to return.

## 2013-12-28 ENCOUNTER — Ambulatory Visit (INDEPENDENT_AMBULATORY_CARE_PROVIDER_SITE_OTHER): Payer: 59 | Admitting: Family

## 2013-12-28 ENCOUNTER — Encounter: Payer: Self-pay | Admitting: Family

## 2013-12-28 VITALS — BP 128/72 | HR 86 | Temp 98.7°F | Wt 242.0 lb

## 2013-12-28 DIAGNOSIS — G44019 Episodic cluster headache, not intractable: Secondary | ICD-10-CM

## 2013-12-28 DIAGNOSIS — R51 Headache: Secondary | ICD-10-CM

## 2013-12-28 DIAGNOSIS — J301 Allergic rhinitis due to pollen: Secondary | ICD-10-CM

## 2013-12-28 DIAGNOSIS — G44011 Episodic cluster headache, intractable: Secondary | ICD-10-CM

## 2013-12-28 MED ORDER — SUMATRIPTAN SUCCINATE 50 MG PO TABS: 50.0000 mg | ORAL_TABLET | ORAL | Status: DC | PRN

## 2013-12-28 MED ORDER — METHYLPREDNISOLONE ACETATE 40 MG/ML IJ SUSP
80.0000 mg | Freq: Once | INTRAMUSCULAR | Status: AC
  Administered 2013-12-28: 80 mg via INTRAMUSCULAR

## 2013-12-28 MED ORDER — KETOROLAC TROMETHAMINE 60 MG/2ML IM SOLN
60.0000 mg | Freq: Once | INTRAMUSCULAR | Status: AC
  Administered 2013-12-28: 60 mg via INTRAMUSCULAR

## 2013-12-28 NOTE — Progress Notes (Signed)
Subjective:    Patient ID: Gerald Johnson, male    DOB: 04/04/1961, 53 y.o.   MRN: [id number]  HPI 53 year old [place] American male, nonsmoker is in today with complaints of a headache behind his left eye x1 day. Denies any sneezing, cough, or congestion. Although he has a history of allergic rhinitis. Reports the headache being 8/10. He has associated nausea and vomiting. Took Excedrin without much relief.    Review of Systems  Constitutional: Negative.   HENT: Negative for nosebleeds.   Respiratory: Negative.   Cardiovascular: Negative.   Endocrine: Negative.   Genitourinary: Negative.   Musculoskeletal: Negative.   Skin: Negative.   Allergic/Immunologic: Negative.   Neurological: Negative for dizziness.  Psychiatric/Behavioral: Negative.   All other systems reviewed and are negative.  Past Medical History  Diagnosis Date  . Acute prostatitis 12/02/2007  . ALLERGIC RHINITIS 02/26/2007  . 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 HYPERLIPIDEMIA 02/26/2007  . Sciatica 05/04/2008  . SINUSITIS, ACUTE NOS 02/26/2007  . SINUSITIS, RECURRENT 02/28/2010  . Asthma   . Hemorrhoids   . Allergy     History   Social History  . Marital Status: Single    Spouse Name: N/A    Number of Children: N/A  . Years of Education: N/A   Occupational History  . Not on file.   Social History Main Topics  . Smoking status: Never Smoker   . Smokeless tobacco: Never Used  . Alcohol Use: Yes  . Drug Use: No  . Sexual Activity: Yes    Birth Control/ Protection: Condom   Other Topics Concern  . Not on file   Social History Narrative  . No narrative on file    Past Surgical History  Procedure Laterality Date  . Wisdom tooth extraction      Family History  Problem Relation Age of Onset  . Diabetes Brother   . Colon cancer Neg Hx   . Esophageal cancer Neg Hx   . Rectal cancer Neg Hx   . Stomach cancer Neg Hx     No Known Allergies  Current Outpatient Prescriptions on  File Prior to Visit  Medication Sig Dispense Refill  . Aspirin-Acetaminophen-Caffeine (EXCEDRIN PO) Take by mouth as needed.      Marland Kitchen ofloxacin (OCUFLOX) 0.3 % ophthalmic solution Use 1-2 drops in right eye every 3 hours today, then 4 times daily  5 mL  0  . doxycycline (VIBRAMYCIN) 100 MG capsule Take 1 capsule (100 mg total) by mouth 2 (two) times daily.  20 capsule  0   No current facility-administered medications on file prior to visit.    BP 128/72  Pulse 86  Temp(Src) 98.7 F (37.1 C) (Oral)  Wt 242 lb (109.77 kg)chart    Objective:   Physical Exam  Constitutional: He is oriented to person, place, and time. He appears well-developed and well-nourished.  HENT:  Right Ear: External ear normal.  Left Ear: External ear normal.  Nose: Nose normal.  Mouth/Throat: Oropharynx is clear and moist.  Eyes: Pupils are equal, round, and reactive to light.  Neck: Normal range of motion. Neck supple.  Cardiovascular: Normal rate, regular rhythm and normal heart sounds.   Pulmonary/Chest: Effort normal and breath sounds normal.  Musculoskeletal: Normal range of motion.  Neurological: He is alert and oriented to person, place, and time.  Skin: Skin is warm and dry.  Psychiatric: He has a normal mood and affect.          Assessment &  Plan:  Liliana was seen today for sinusitis.  Diagnoses and associated orders for this visit:  Intractable episodic cluster headache  Headache(784.0) - ketorolac (TORADOL) injection 60 mg; Inject 2 mLs (60 mg total) into the muscle once. - methylPREDNISolone acetate (DEPO-MEDROL) injection 80 mg; Inject 2 mLs (80 mg total) into the muscle once.  Allergic rhinitis due to pollen  Other Orders - SUMAtriptan (IMITREX) 50 MG tablet; Take 1 tablet (50 mg total) by mouth every 2 (two) hours as needed for migraine. May repeat in 2 hours if headache persists or recurs.   Call th office with any questions or concerns. Recheck as scheduled and as needed.

## 2013-12-28 NOTE — Progress Notes (Signed)
Pre visit review using our clinic review tool, if applicable. No additional management support is needed unless otherwise documented below in the visit note. 

## 2013-12-28 NOTE — Patient Instructions (Signed)
Cluster Headache Cluster headaches are recognized by their pattern of deep, intense head pain. They normally occur on one side of your head, but they may "switch sides" in subsequent episodes. Typically, cluster headaches:   Are severe in nature.   Occur repeatedly over weeks to months and are followed by periods of no headaches.   Can last from 15 minutes to 3 hours.   Occur at the same time each day, often at night.   Occur several times a day. CAUSES The exact cause of cluster headaches is not known. Alcohol use may be associated with cluster headaches. SIGNS AND SYMPTOMS   Severe pain that begins in or around your eye or temple.   One-sided head pain.   Feeling sick to your stomach (nauseous).   Sensitivity to light.   Runny nose.   Eye redness, tearing, and nasal stuffiness on the side of your head where you are experiencing pain.   Sweaty, pale skin of the face.   Droopy or swollen eyelid.   Restlessness. DIAGNOSIS  Cluster headaches are diagnosed based on symptoms and a physical exam. Your health care provider may order a CT scan or an MRI of your head or lab tests to see if your headaches are caused by other medical conditions.  TREATMENT   Medicines for pain relief and to prevent recurrent attacks. Some people may need a combination of medicines.  Oxygen for pain relief.   Biofeedback programs to help reduce headache pain.  It may be helpful to keep a headache diary. This may help you find a trend for what is triggering your headaches. Your health care provider can develop a treatment plan.  HOME CARE INSTRUCTIONS  During cluster periods:   Follow a regular sleep schedule. Do not vary the amount and time that you sleep from day to day. It is important to stay on the same schedule during a cluster period to help prevent headaches.   Avoid alcohol.   Stop smoking if you smoke.  SEEK MEDICAL CARE IF:  You have any changes from your previous  cluster headaches either in intensity or frequency.   You are not getting relief from medicines you are taking.  SEEK IMMEDIATE MEDICAL CARE IF:   You faint.   You have weakness or numbness, especially on one side of your body or face.   You have double vision.   You have nausea or vomiting that is not relieved within several hours.   You cannot keep your balance or have difficulty talking or walking.   You have neck pain or stiffness.   You have a fever. MAKE SURE YOU:  Understand these instructions.   Will watch your condition.   Will get help right away if you are not doing well or get worse. Document Released: 05/13/2005 Document Revised: 03/03/2013 Document Reviewed: 12/03/2012 [employer] Patient Information 2015 [employer]. This information is not intended to replace advice given to you by your health care provider. Make sure you discuss any questions you have with your health care provider.  

## 2014-03-29 ENCOUNTER — Ambulatory Visit (INDEPENDENT_AMBULATORY_CARE_PROVIDER_SITE_OTHER): Payer: 59 | Admitting: Family Medicine

## 2014-03-29 ENCOUNTER — Encounter: Payer: Self-pay | Admitting: Family Medicine

## 2014-03-29 VITALS — BP 128/89 | HR 109 | Temp 98.0°F | Ht 73.25 in | Wt 246.0 lb

## 2014-03-29 DIAGNOSIS — J0100 Acute maxillary sinusitis, unspecified: Secondary | ICD-10-CM

## 2014-03-29 MED ORDER — KETOROLAC TROMETHAMINE 60 MG/2ML IM SOLN
60.0000 mg | Freq: Once | INTRAMUSCULAR | Status: AC
  Administered 2014-03-29: 60 mg via INTRAMUSCULAR

## 2014-03-29 MED ORDER — HYDROCODONE-HOMATROPINE 5-1.5 MG/5ML PO SYRP: 5.0000 mL | ORAL_SOLUTION | ORAL | Status: DC | PRN

## 2014-03-29 MED ORDER — AZITHROMYCIN 250 MG PO TABS: ORAL_TABLET | ORAL | Status: DC

## 2014-03-29 NOTE — Progress Notes (Signed)
Pre visit review using our clinic review tool, if applicable. No additional management support is needed unless otherwise documented below in the visit note. 

## 2014-03-29 NOTE — Progress Notes (Signed)
   Subjective:    Patient ID: Gerald Johnson, male    DOB: 09/15/1960, 53 y.o.   MRN: [id number]  HPI Here for one week of sinus pressure, HA, PND, and a dry cough.    Review of Systems  Constitutional: Negative.   HENT: Positive for congestion, postnasal drip and sinus pressure.   Eyes: Negative.   Respiratory: Positive for cough.        Objective:   Physical Exam  Constitutional: He appears well-developed and well-nourished.  HENT:  Right Ear: External ear normal.  Left Ear: External ear normal.  Nose: Nose normal.  Mouth/Throat: Oropharynx is clear and moist.  Eyes: Conjunctivae are normal.  Pulmonary/Chest: Effort normal and breath sounds normal.  Lymphadenopathy:    He has no cervical adenopathy.          Assessment & Plan:  Add Mucinex. Written out of work today

## 2014-03-29 NOTE — Addendum Note (Signed)
Addended by: Aggie Hacker A on: 03/29/2014 12:02 PM   Modules accepted: Orders

## 2014-04-26 ENCOUNTER — Ambulatory Visit: Payer: 59 | Admitting: Family

## 2014-04-27 ENCOUNTER — Ambulatory Visit (INDEPENDENT_AMBULATORY_CARE_PROVIDER_SITE_OTHER): Payer: 59 | Admitting: Podiatrist

## 2014-04-27 ENCOUNTER — Encounter: Payer: Self-pay | Admitting: Family Medicine

## 2014-04-27 ENCOUNTER — Ambulatory Visit (INDEPENDENT_AMBULATORY_CARE_PROVIDER_SITE_OTHER): Payer: 59

## 2014-04-27 ENCOUNTER — Ambulatory Visit: Payer: 59

## 2014-04-27 ENCOUNTER — Ambulatory Visit (INDEPENDENT_AMBULATORY_CARE_PROVIDER_SITE_OTHER): Payer: 59 | Admitting: Family Medicine

## 2014-04-27 VITALS — BP 126/95 | HR 91 | Temp 98.2°F | Ht 73.25 in | Wt 245.0 lb

## 2014-04-27 DIAGNOSIS — M79671 Pain in right foot: Secondary | ICD-10-CM

## 2014-04-27 DIAGNOSIS — M76829 Posterior tibial tendinitis, unspecified leg: Secondary | ICD-10-CM

## 2014-04-27 DIAGNOSIS — M79672 Pain in left foot: Secondary | ICD-10-CM

## 2014-04-27 DIAGNOSIS — M6789 Other specified disorders of synovium and tendon, multiple sites: Secondary | ICD-10-CM

## 2014-04-27 DIAGNOSIS — J0100 Acute maxillary sinusitis, unspecified: Secondary | ICD-10-CM

## 2014-04-27 MED ORDER — MELOXICAM 15 MG PO TABS: 15.0000 mg | ORAL_TABLET | Freq: Every day | ORAL | Status: DC

## 2014-04-27 MED ORDER — AMOXICILLIN-POT CLAVULANATE 875-125 MG PO TABS: 1.0000 | ORAL_TABLET | Freq: Two times a day (BID) | ORAL | Status: DC

## 2014-04-27 MED ORDER — METHYLPREDNISOLONE ACETATE 80 MG/ML IJ SUSP
120.0000 mg | Freq: Once | INTRAMUSCULAR | Status: AC
  Administered 2014-04-27: 120 mg via INTRAMUSCULAR

## 2014-04-27 NOTE — Progress Notes (Signed)
   Subjective:    Patient ID: Gerald Johnson, male    DOB: 11-11-1960, 53 y.o.   MRN: [id number]  HPI  PT STATED B/L MEDIAL SIDE OF THE FOOT IS BEEN HURTING ON AND OFF FOR 6 MONTHS. THE FOOT GET WORSE WHEN STANDING AND WALKING. TRIED WEARING ANKLE BRACE BUT NO HELP.  Review of Systems  All other systems reviewed and are negative.      Objective:   Physical Exam   Patient is awake, alert, and oriented x 3.  In no acute distress.  Vascular status is intact with palpable pedal pulses at 2/4 DP and PT bilateral and capillary refill time within normal limits. Neurological sensation is also intact bilaterally via Semmes Weinstein monofilament at 5/5 sites. Light touch, vibratory sensation, Achilles tendon reflex is intact. Dermatological exam reveals skin color, turger and texture as normal. No open lesions present.  Musculature intact with dorsiflexion, plantarflexion, inversion, eversion.  Pain along the course of the posterior tibial tendon is noted.  pronatory changes present especially when standing.  No discomfort along the plantar fascial insertion     Assessment & Plan:  Assessment: posterior tibial tendonitis  Plan: Recommended a good supportive ankle brace and this was dispensed today. Dispensed stretching and strengthening instructions as well.

## 2014-04-27 NOTE — Progress Notes (Signed)
Pre visit review using our clinic review tool, if applicable. No additional management support is needed unless otherwise documented below in the visit note. 

## 2014-04-27 NOTE — Progress Notes (Signed)
   Subjective:    Patient ID: Gerald Johnson, male    DOB: [birth year], 53 y.o.   MRN: [id number]  HPI Here for recurrent sinus symptoms. He was here a month ago for a sinusitis and was given a Zpack. He got better for awhile but now for the past 4 days he again has sinus pressure, HA behind the eyes, PND, ST, and a dry cough.    Review of Systems  Constitutional: Negative.   HENT: Positive for congestion, postnasal drip and sinus pressure.   Eyes: Negative.   Respiratory: Positive for cough.        Objective:   Physical Exam  Constitutional: He appears well-developed and well-nourished.  HENT:  Right Ear: External ear normal.  Left Ear: External ear normal.  Nose: Nose normal.  Mouth/Throat: Oropharynx is clear and moist.  Eyes: Conjunctivae are normal.  Pulmonary/Chest: Effort normal and breath sounds normal.  Lymphadenopathy:    He has no cervical adenopathy.          Assessment & Plan:  Treat with Augmentin and a steroid shot.

## 2014-04-27 NOTE — Patient Instructions (Signed)
Posterior Tibial Tendon Tendinitis with Rehab Tendonitis is a condition that is characterized by inflammation of a tendon or the lining (sheath) that surrounds it. The inflammation is usually caused by damage to the tendon, such as a tendon tear (strain). Sprains are classified into three categories. Grade 1 sprains cause pain, but the tendon is not lengthened. Grade 2 sprains include a lengthened ligament due to the ligament being stretched or partially ruptured. With grade 2 sprains there is still function, although the function may be diminished. Grade 3 sprains are characterized by a complete tear of the tendon or muscle, and function is usually impaired. Posterior tibialis tendonitis is tendonitis of the posterior tibial tendon, which attaches muscles of the lower leg to the foot. The posterior tibial tendon is located in the back of the ankle and helps the body straighten (plantar flex) and rotate inward (medially rotate) the ankle. SYMPTOMS   Pain, tenderness, swelling, warmth, and/or redness over the back of the inner ankle at the posterior tibial tendon or the inner part of the mid-foot.  Pain that worsens with plantar flexion or medial rotation of the ankle.  A crackling sound (crepitation) when the tendon is moved or touched. CAUSES  Posterior tibial tendonitis occurs when damage to the posterior tibial tendon starts an inflammatory response. Common mechanisms of injury include:  Degenerative (occurs with aging) processes that weaken the tendon and make it more susceptible to injury.  Stress placed on the tendon from an increase in the intensity, frequency, or duration of training.  Direct trauma to the ankle.  Returning to activity before a previous ankle injury is allowed to heal. RISK INCREASES WITH:  Activities that involve repetitive and/or stressful plantar flexion (jumping, kicking, or running up/down hills).  Poor strength and flexibility.  Flat feet.  Previous injury  to the foot, ankle, or leg. PREVENTION   Warm up and stretch properly before activity.  Allow for adequate recovery between workouts.  Maintain physical fitness:  Strength, flexibility, and endurance.  Cardiovascular fitness.  Learn and use proper technique. When possible, have a coach correct improper technique.  Complete rehabilitation from a previous foot, ankle, or leg injury.  If you have flat feet, wear arch supports (orthotics). PROGNOSIS  If treated properly, the symptoms of tendonitis usually resolve within 6 weeks. This period may be shorter for injuries caused by direct trauma. RELATED COMPLICATIONS   Prolonged healing time, if improperly treated or reinjured.  Recurrent symptoms that result in a chronic problem.  Partial or complete tendon tear (rupture) requiring surgery. TREATMENT  Treatment initially involves the use of ice and medication to help reduce pain and inflammation. The use of strengthening and stretching exercises may help reduce pain with activity. These exercises may be performed at home or with referral to a therapist. Often times, your caregiver will recommend immobilizing the ankle to allow the tendon to heal. If you have flat feet, you may be advised to wear orthotic arch supports. If symptoms persist for greater than 6 months despite nonsurgical (conservative) treatment, then surgery may be recommended. MEDICATION   If pain medication is necessary, then nonsteroidal anti-inflammatory medications, such as aspirin and ibuprofen, or other minor pain relievers, such as acetaminophen, are often recommended.  Do not take pain medication for 7 days before surgery.  Prescription pain relievers may be given if deemed necessary by your caregiver. Use only as directed and only as much as you need.  Corticosteroid injections may be given by your caregiver. These injections should  be reserved for the most serious cases because they may only be given a certain  number of times. HEAT AND COLD  Cold treatment (icing) relieves pain and reduces inflammation. Cold treatment should be applied for 10 to 15 minutes every 2 to 3 hours for inflammation and pain and immediately after any activity that aggravates your symptoms. Use ice packs or massage the area with a piece of ice (ice massage).  Heat treatment may be used prior to performing the stretching and strengthening activities prescribed by your caregiver, physical therapist, or athletic trainer. Use a heat pack or soak the injury in warm water. SEEK MEDICAL CARE IF:  Treatment seems to offer no benefit, or the condition worsens.  Any medications produce adverse side effects. EXERCISES RANGE OF MOTION (ROM) AND STRETCHING EXERCISES - Posterior Tibial Tendon Tendinitis These exercises may help you when beginning to rehabilitate your injury. Your symptoms may resolve with or without further involvement from your physician, physical therapist or athletic trainer. While completing these exercises, remember:   Restoring tissue flexibility helps normal motion to return to the joints. This allows healthier, less painful movement and activity.  An effective stretch should be held for at least 30 seconds.  A stretch should never be painful. You should only feel a gentle lengthening or release in the stretched tissue. RANGE OF MOTION - Ankle Plantar Flexion   Sit with your right / left leg crossed over your opposite knee.  Use your opposite hand to pull the top of your foot and toes toward you.  You should feel a gentle stretch on the top of your foot/ankle. Hold this position for __________ seconds. Repeat __________ times. Complete this exercise __________ times per day.  RANGE OF MOTION - Ankle Eversion   Sit with your right / left ankle crossed over your opposite knee.  Grip your foot with your opposite hand, placing your thumb on the top of your foot and your fingers across the bottom of your  foot.  Gently push your foot downward with a slight rotation so your littlest toes rise slightly.  You should feel a gentle stretch on the inside of your ankle. Hold the stretch for __________ seconds. Repeat __________ times. Complete this exercise __________ times per day.  RANGE OF MOTION - Ankle Inversion   Sit with your right / left ankle crossed over your opposite knee.  Grip your foot with your opposite hand, placing your thumb on the bottom of your foot and your fingers across the top of your foot.  Gently pull your foot so the smallest toe comes toward you and your thumb pushes the inside of the ball of your foot away from you.  You should feel a gentle stretch on the outside of your ankle. Hold the stretch for __________ seconds. Repeat __________ times. Complete this exercise __________ times per day.  RANGE OF MOTION - Dorsi/Plantar Flexion  While sitting with your right / left knee straight, draw the top of your foot upward by flexing your ankle. Then reverse the motion, pointing your toes downward.  Hold each position for __________ seconds.  After completing your first set of exercises, repeat this exercise with your knee bent. Repeat __________ times. Complete this exercise __________ times per day.  RANGE OF MOTION - Ankle Alphabet  Imagine your right / left big toe is a pen.  Keeping your hip and knee still, write out the entire alphabet with your "pen." Make the letters as large as you can without   increasing any discomfort. Repeat __________ times. Complete this exercise __________ times per day.  STRETCH - Gastrocsoleus   Sit with your right / left leg extended. Holding onto both ends of a belt or towel, loop it around the ball of your foot.  Keeping your right / left ankle and foot relaxed and your knee straight, pull your foot and ankle toward you using the belt/towel.  You should feel a gentle stretch behind your calf or knee. Hold this position for  __________ seconds. Repeat __________ times. Complete this exercise __________ times per day.  STRETCH - Gastroc, Standing   Place hands on wall.  Extend right / left leg, keeping the front knee somewhat bent.  Slightly point your toes inward on your back foot.  Keeping your right / left heel on the floor and your knee straight, shift your weight toward the wall, not allowing your back to arch.  You should feel a gentle stretch in the right / left calf. Hold this position for __________ seconds. Repeat __________ times. Complete this stretch __________ times per day. STRETCH - Soleus, Standing   Place hands on wall.  Extend right / left leg, keeping the other knee somewhat bent.  Slightly point your toes inward on your back foot.  Keep your right / left heel on the floor, bend your back knee, and slightly shift your weight over the back leg so that you feel a gentle stretch deep in your back calf.  Hold this position for __________ seconds. Repeat __________ times. Complete this stretch __________ times per day. STRENGTHENING EXERCISES - Posterior Tibial Tendon Tendinitis These exercises may help you when beginning to rehabilitate your injury. They may resolve your symptoms with or without further involvement from your physician, physical therapist, or athletic trainer. While completing these exercises, remember:   Muscles can gain both the endurance and the strength needed for everyday activities through controlled exercises.  Complete these exercises as instructed by your physician, physical therapist, or athletic trainer. Progress the resistance and repetitions only as guided. STRENGTH - Dorsiflexors  Secure a rubber exercise band/tubing to a fixed object (i.e., table, pole) and loop the other end around your right / left foot.  Sit on the floor facing the fixed object. The band/tubing should be slightly tense when your foot is relaxed.  Slowly draw your foot back toward you  using your ankle and toes.  Hold this position for __________ seconds. Slowly release the tension in the band and return your foot to the starting position. Repeat __________ times. Complete this exercise __________ times per day.  STRENGTH - Towel Curls  Sit in a chair positioned on a non-carpeted surface.  Place your foot on a towel, keeping your heel on the floor.  Pull the towel toward your heel by only curling your toes. Keep your heel on the floor.  If instructed by your physician, physical therapist, or athletic trainer, add ____________________ at the end of the towel. Repeat __________ times. Complete this exercise __________ times per day. STRENGTH - Ankle Eversion   Secure one end of a rubber exercise band/tubing to a fixed object (table, pole). Loop the other end around your foot just before your toes.  Place your fists between your knees. This will focus your strengthening at your ankle.  Drawing the band/tubing across your opposite foot, slowly pull your little toe out and up. Make sure the band/tubing is positioned to resist the entire motion.  Hold this position for __________ seconds.  Have   your muscles resist the band/tubing as it slowly pulls your foot back to the starting position. Repeat __________ times. Complete this exercise __________ times per day.  STRENGTH - Ankle Inversion   Secure one end of a rubber exercise band/tubing to a fixed object (table, pole). Loop the other end around your foot just before your toes.  Place your fists between your knees. This will focus your strengthening at your ankle.  Slowly, pull your big toe up and in, making sure the band/tubing is positioned to resist the entire motion.  Hold this position for __________ seconds.  Have your muscles resist the band/tubing as it slowly pulls your foot back to the starting position. Repeat __________ times. Complete this exercises __________ times per day.  Document Released:  05/13/2005 Document Revised: 09/27/2013 Document Reviewed: 08/25/2008 [employer] Patient Information 2015 [employer]. This information is not intended to replace advice given to you by your health care provider. Make sure you discuss any questions you have with your health care provider.  

## 2014-04-27 NOTE — Addendum Note (Signed)
Addended by: Aggie Hacker A on: 04/27/2014 12:50 PM   Modules accepted: Orders

## 2014-05-17 ENCOUNTER — Other Ambulatory Visit: Payer: Self-pay | Admitting: Family

## 2014-05-18 ENCOUNTER — Telehealth: Payer: Self-pay | Admitting: Internal Medicine

## 2014-05-18 MED ORDER — SUMATRIPTAN SUCCINATE 50 MG PO TABS: 50.0000 mg | ORAL_TABLET | ORAL | Status: DC | PRN

## 2014-05-18 NOTE — Telephone Encounter (Signed)
Pt request refill SUMAtriptan (IMITREX) 50 MG tablet Pt's rx has been refused , pt states this med is "as needed" and he needs  asap.  Pt has appt 12/30 [facility]/ [address]

## 2014-05-18 NOTE — Telephone Encounter (Signed)
One refill authorized

## 2014-05-25 ENCOUNTER — Ambulatory Visit (INDEPENDENT_AMBULATORY_CARE_PROVIDER_SITE_OTHER): Payer: 59 | Admitting: Family

## 2014-05-25 ENCOUNTER — Encounter: Payer: Self-pay | Admitting: Family

## 2014-05-25 VITALS — BP 122/82 | HR 104 | Temp 98.2°F | Ht 73.75 in | Wt 245.9 lb

## 2014-05-25 DIAGNOSIS — G43909 Migraine, unspecified, not intractable, without status migrainosus: Secondary | ICD-10-CM

## 2014-05-25 DIAGNOSIS — J309 Allergic rhinitis, unspecified: Secondary | ICD-10-CM

## 2014-05-25 DIAGNOSIS — E7800 Pure hypercholesterolemia, unspecified: Secondary | ICD-10-CM

## 2014-05-25 NOTE — Progress Notes (Signed)
Subjective:    Patient ID: Gerald Johnson, male    DOB: March 29, 1961, 53 y.o.   MRN: [id number]  HPI  53 year old AAM, nonsmoker, is in today to be established. Has a history hyperlipidemia, allergic rhinitis, allergic rhinitis and migraine headache. Currently under the care of allergist and will began allergy injection soon. I currently on any medications for cholesterol. Reports being stable on Imitrex when needed for headache   Review of Systems  Constitutional: Negative.   HENT: Positive for congestion, postnasal drip and rhinorrhea.   Respiratory: Negative.   Cardiovascular: Negative.   Gastrointestinal: Negative.   Endocrine: Negative.   Genitourinary: Negative.   Musculoskeletal: Negative.   Skin: Negative.   Allergic/Immunologic: Negative.   Neurological: Negative.   Hematological: Negative.   Psychiatric/Behavioral: Negative.    Past Medical History  Diagnosis Date  . Acute prostatitis 12/02/2007  . ALLERGIC RHINITIS 02/26/2007  . 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 HYPERLIPIDEMIA 02/26/2007  . Sciatica 05/04/2008  . SINUSITIS, ACUTE NOS 02/26/2007  . SINUSITIS, RECURRENT 02/28/2010  . Asthma   . Hemorrhoids   . Allergy     History   Social History  . Marital Status: Single    Spouse Name: N/A    Number of Children: N/A  . Years of Education: N/A   Occupational History  . Not on file.   Social History Main Topics  . Smoking status: Never Smoker   . Smokeless tobacco: Never Used  . Alcohol Use: 0.0 oz/week    0 Not specified per week     Comment: occ  . Drug Use: No  . Sexual Activity: Yes    Birth Control/ Protection: Condom   Other Topics Concern  . Not on file   Social History Narrative    Past Surgical History  Procedure Laterality Date  . Wisdom tooth extraction      Family History  Problem Relation Age of Onset  . Diabetes Brother   . Colon cancer Neg Hx   . Esophageal cancer Neg Hx   . Rectal cancer Neg Hx   . Stomach  cancer Neg Hx     No Known Allergies  Current Outpatient Prescriptions on File Prior to Visit  Medication Sig Dispense Refill  . Aspirin-Acetaminophen-Caffeine (EXCEDRIN PO) Take by mouth as needed.    Marland Kitchen HYDROcodone-homatropine (HYDROMET) 5-1.5 MG/5ML syrup Take 5 mLs by mouth every 4 (four) hours as needed. 240 mL 0  . ofloxacin (OCUFLOX) 0.3 % ophthalmic solution Use 1-2 drops in right eye every 3 hours today, then 4 times daily 5 mL 0  . SUMAtriptan (IMITREX) 50 MG tablet Take 1 tablet (50 mg total) by mouth every 2 (two) hours as needed for migraine. May repeat in 2 hours if headache persists or recurs. 10 tablet 0   No current facility-administered medications on file prior to visit.    There were no vitals taken for this visit.chart     Objective:   Physical Exam  Constitutional: He is oriented to person, place, and time. He appears well-developed and well-nourished.  HENT:  Right Ear: External ear normal.  Left Ear: External ear normal.  Nose: Nose normal.  Mouth/Throat: Oropharynx is clear and moist.  Neck: Normal range of motion. Neck supple. No thyromegaly present.  Cardiovascular: Normal rate, regular rhythm and normal heart sounds.   Pulmonary/Chest: Effort normal and breath sounds normal.  Abdominal: Soft. Bowel sounds are normal.  Musculoskeletal: Normal range of motion.  Neurological: He is  alert and oriented to person, place, and time.  Skin: Skin is warm and dry.  Psychiatric: He has a normal mood and affect.          Assessment & Plan:  Gerald Johnson was seen today for no specified reason.  Diagnoses and associated orders for this visit:  Migraine without status migrainosus, not intractable, unspecified migraine type  Pure hypercholesterolemia - Lipid Panel; Future - CMP; Future  Allergic rhinitis, unspecified allergic rhinitis type    advise complete physical exam with fasting labs. Will return tomorrow for fasting cholesterol.

## 2014-05-25 NOTE — Progress Notes (Signed)
Pre visit review using our clinic review tool, if applicable. No additional management support is needed unless otherwise documented below in the visit note. 

## 2014-05-26 ENCOUNTER — Other Ambulatory Visit (INDEPENDENT_AMBULATORY_CARE_PROVIDER_SITE_OTHER): Payer: 59

## 2014-05-26 DIAGNOSIS — E7800 Pure hypercholesterolemia, unspecified: Secondary | ICD-10-CM

## 2014-05-26 LAB — LIPID PANEL
CHOL/HDL RATIO: 5
Cholesterol: 246 mg/dL — ABNORMAL HIGH (ref 0–200)
HDL: 47 mg/dL (ref >39.00)
LDL Cholesterol: 185 mg/dL — ABNORMAL HIGH (ref 0–99)
NonHDL: 199
TRIGLYCERIDES: 71 mg/dL (ref 0.0–149.0)
VLDL: 14.2 mg/dL (ref 0.0–40.0)

## 2014-05-26 LAB — COMPREHENSIVE METABOLIC PANEL
ALT: 31 U/L (ref 0–53)
AST: 22 U/L (ref 0–37)
Albumin: 4 g/dL (ref 3.5–5.2)
Alkaline Phosphatase: 62 U/L (ref 39–117)
BUN: 17 mg/dL (ref 6–23)
CALCIUM: 8.7 mg/dL (ref 8.4–10.5)
CO2: 24 mEq/L (ref 19–32)
CREATININE: 1.2 mg/dL (ref 0.4–1.5)
Chloride: 105 mEq/L (ref 96–112)
GFR: 80.52 mL/min (ref >60.00)
Glucose, Bld: 95 mg/dL (ref 70–99)
POTASSIUM: 3.8 meq/L (ref 3.5–5.1)
SODIUM: 135 meq/L (ref 135–145)
Total Bilirubin: 0.8 mg/dL (ref 0.2–1.2)
Total Protein: 7.4 g/dL (ref 6.0–8.3)

## 2014-05-30 ENCOUNTER — Other Ambulatory Visit: Payer: Self-pay | Admitting: Family

## 2014-05-30 MED ORDER — SIMVASTATIN 20 MG PO TABS: 20.0000 mg | ORAL_TABLET | Freq: Every day | ORAL | Status: DC

## 2014-06-24 ENCOUNTER — Ambulatory Visit (INDEPENDENT_AMBULATORY_CARE_PROVIDER_SITE_OTHER): Payer: 59

## 2014-06-24 ENCOUNTER — Ambulatory Visit (INDEPENDENT_AMBULATORY_CARE_PROVIDER_SITE_OTHER): Payer: 59 | Admitting: Family Medicine

## 2014-06-24 VITALS — BP 122/82 | HR 72 | Temp 97.9°F | Resp 16 | Ht 74.0 in | Wt 243.6 lb

## 2014-06-24 DIAGNOSIS — M25562 Pain in left knee: Secondary | ICD-10-CM

## 2014-06-24 MED ORDER — DICLOFENAC SODIUM 75 MG PO TBEC: 75.0000 mg | DELAYED_RELEASE_TABLET | Freq: Two times a day (BID) | ORAL | Status: DC

## 2014-06-24 NOTE — Progress Notes (Signed)
Subjective:  This chart was scribed for Robyn Haber, MD by Mercy Moore, Medial Scribe. This patient was seen in room 12 and the patient's care was started at 9:53 AM.    Patient ID: Gerald Johnson, Gerald Johnson    DOB: 12-24-1960, 54 y.o.   MRN: [id number]  Chief Complaint  Patient presents with  . Knee Pain    left x 1 day    HPI HPI Comments: Gerald Johnson is a 54 y.o. Gerald Johnson who presents to the [facility] complaining of left knee pain, onset this morning. Patient reports that upon wakening he was unable to bear weight on his knee, fully extend his knee or straighten his leg. Patient reports exacerbated pain with ambulation. Patient reports that he walks a lot at work, but he denies direct injury or trauma incurred yesterday to cause his current pain.  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   Patient Active Problem List   Diagnosis Date Noted  . Headache behind the eyes 07/03/2010  . 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 Sciatica 05/04/2008  . Acute prostatitis 12/02/2007  . HYPERLIPIDEMIA 02/26/2007  . SINUSITIS, ACUTE NOS 02/26/2007  . ALLERGIC RHINITIS 02/26/2007   Past Medical History  Diagnosis Date  . Acute prostatitis 12/02/2007  . ALLERGIC RHINITIS 02/26/2007  . 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 HYPERLIPIDEMIA 02/26/2007  . Sciatica 05/04/2008  . SINUSITIS, ACUTE NOS 02/26/2007  . SINUSITIS, RECURRENT 02/28/2010  . Asthma   . Hemorrhoids   . Allergy    Past Surgical History  Procedure Laterality Date  . Wisdom tooth extraction     No Known Allergies Prior to Admission medications   Medication Sig Start Date End Date Taking? Authorizing Provider  Aspirin-Acetaminophen-Caffeine (EXCEDRIN PO) Take by mouth as needed.   Yes Historical Provider, MD  ofloxacin (OCUFLOX) 0.3 % ophthalmic solution Use 1-2 drops in right eye every 3 hours today, then 4 times daily 12/04/13  Yes Posey Boyer, MD  simvastatin  (ZOCOR) 20 MG tablet Take 1 tablet (20 mg total) by mouth at bedtime. 05/30/14  Yes Timoteo Gaul, FNP   History   Social History  . Marital Status: Single    Spouse Name: N/A    Number of Children: N/A  . Years of Education: N/A   Occupational History  . Not on file.   Social History Main Topics  . Smoking status: Never Smoker   . Smokeless tobacco: Never Used  . Alcohol Use: 0.0 oz/week    0 Not specified per week     Comment: occ  . Drug Use: No  . Sexual Activity: Yes    Birth Control/ Protection: Condom   Other Topics Concern  . Not on file   Social History Narrative     Review of Systems  Constitutional: Negative for fever and chills.  Musculoskeletal: Positive for joint swelling.       Right knee pain  Skin: Negative for color change.       Objective:   Physical Exam  Constitutional: He is oriented to person, place, and time. He appears well-developed and well-nourished. No distress.  HENT:  Head: Normocephalic and atraumatic.  Eyes: EOM are normal.  Neck: Neck supple. No tracheal deviation present.  Cardiovascular: Normal rate.   Pulmonary/Chest: Effort normal. No respiratory distress.  Musculoskeletal: Normal range of motion.  Left knee: small effusion. Ligaments intact and nontender. Full ROM but does have pain when fully extended. Inspection  of the knee is normal.   Neurological: He is alert and oriented to person, place, and time.  Skin: Skin is warm and dry.  Psychiatric: He has a normal mood and affect. His behavior is normal.  Nursing note and vitals reviewed.   Filed Vitals:   06/24/14 0920  BP: 122/82  Pulse: 72  Temp: 97.9 F (36.6 C)  TempSrc: Oral  Resp: 16  Height: 6\' 2"  (1.88 m)  Weight: 243 lb 9.6 oz (110.496 kg)  SpO2: 95%   [facility] reading (PRIMARY) by  Dr. Joseph Art:  Left knee shows irregular surface of lateral femoral condyle.     Assessment & Plan:   This chart was scribed in my presence and reviewed by me  personally.    ICD-9-CM ICD-10-CM   1. Left knee pain 719.46 M25.562 DG Knee Complete 4 Views Left     diclofenac (VOLTAREN) 75 MG EC tablet     AMB referral to orthopedics     Signed, Robyn Haber, MD

## 2014-06-24 NOTE — Patient Instructions (Addendum)
You have a cartilage defect showing on the x-ray. I need to send you to orthopedics to have this evaluated further.  You will be worked in with Dr. Latanya Maudlin at 2 p.m at [facility] located at [employer] [facility] [employer]. Their phone number is [phone]

## 2014-07-28 ENCOUNTER — Ambulatory Visit (INDEPENDENT_AMBULATORY_CARE_PROVIDER_SITE_OTHER): Payer: Self-pay | Admitting: Family Medicine

## 2014-07-28 ENCOUNTER — Encounter: Payer: Self-pay | Admitting: Family Medicine

## 2014-07-28 VITALS — BP 120/80 | HR 90 | Temp 98.2°F | Wt 247.0 lb

## 2014-07-28 DIAGNOSIS — B349 Viral infection, unspecified: Secondary | ICD-10-CM

## 2014-07-28 LAB — POCT INFLUENZA A/B
INFLUENZA A, POC: NEGATIVE
Influenza B, POC: NEGATIVE

## 2014-07-28 NOTE — Progress Notes (Signed)
   Subjective:    Patient ID: Gerald Johnson, male    DOB: Dec 26, 1960, 54 y.o.   MRN: [id number]  HPI Acute visit. Patient seen with onset about 2 days ago of cough, nasal congestion, chills but no document of fever. Intermittent headaches. Diffuse body aches. He's had some nausea and occasional vomiting. No diarrhea. No abdominal pain. Leftover hydrocodone cough syrup which has helped his cough somewhat.  Past Medical History  Diagnosis Date  . Acute prostatitis 12/02/2007  . ALLERGIC RHINITIS 02/26/2007  . 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 HYPERLIPIDEMIA 02/26/2007  . Sciatica 05/04/2008  . SINUSITIS, ACUTE NOS 02/26/2007  . SINUSITIS, RECURRENT 02/28/2010  . Asthma   . Hemorrhoids   . Allergy    Past Surgical History  Procedure Laterality Date  . Wisdom tooth extraction      reports that he has never smoked. He has never used smokeless tobacco. He reports that he drinks alcohol. He reports that he does not use illicit drugs. family history includes Diabetes in his brother. There is no history of Colon cancer, Esophageal cancer, Rectal cancer, or Stomach cancer. No Known Allergies    Review of Systems  Constitutional: Positive for chills and fatigue. Negative for fever.  HENT: Positive for congestion.   Respiratory: Positive for cough.   Gastrointestinal: Positive for nausea and vomiting. Negative for diarrhea.  Skin: Negative for rash.  Neurological: Positive for headaches.       Objective:   Physical Exam  Constitutional: He appears well-developed and well-nourished.  HENT:  Right Ear: External ear normal.  Left Ear: External ear normal.  Mouth/Throat: Oropharynx is clear and moist.  Neck: Neck supple.  Cardiovascular: Normal rate and regular rhythm.   Pulmonary/Chest: Effort normal and breath sounds normal. No respiratory distress. He has no wheezes. He has no rales.  Lymphadenopathy:    He has no cervical adenopathy.          Assessment &  Plan:  Viral syndrome. Doubt influenza with lack of documented fever. Patient requesting check. Will check Rapid influenza screen. If negative treat symptomatically Influenza screen negative

## 2014-07-28 NOTE — Patient Instructions (Signed)

## 2014-07-28 NOTE — Progress Notes (Signed)
Pre visit review using our clinic review tool, if applicable. No additional management support is needed unless otherwise documented below in the visit note. 

## 2014-12-19 ENCOUNTER — Ambulatory Visit (INDEPENDENT_AMBULATORY_CARE_PROVIDER_SITE_OTHER): Payer: 59 | Admitting: Adult Health

## 2014-12-19 ENCOUNTER — Encounter: Payer: Self-pay | Admitting: Adult Health

## 2014-12-19 VITALS — BP 110/82 | Temp 98.2°F | Ht 74.0 in | Wt 249.1 lb

## 2014-12-19 DIAGNOSIS — J0100 Acute maxillary sinusitis, unspecified: Secondary | ICD-10-CM | POA: Diagnosis not present

## 2014-12-19 MED ORDER — ONDANSETRON HCL 4 MG PO TABS: 4.0000 mg | ORAL_TABLET | Freq: Three times a day (TID) | ORAL | Status: DC | PRN

## 2014-12-19 NOTE — Patient Instructions (Addendum)
It was nice meeting you today!  It appears as though you have a sinus infection caused by a virus.  Rest and drinking plenty of fluids.   Get some Claritin- D at the pharmacy as well as Flonase  I have sent in Zofran for nausea you can take as needed.   If you are not feeling any better in the next 3-4 days or if you develop a fever,please let me know.    Sinusitis Sinusitis is redness, soreness, and inflammation of the paranasal sinuses. Paranasal sinuses are air pockets within the bones of your face (beneath the eyes, the middle of the forehead, or above the eyes). In healthy paranasal sinuses, mucus is able to drain out, and air is able to circulate through them by way of your nose. However, when your paranasal sinuses are inflamed, mucus and air can become trapped. This can allow bacteria and other germs to grow and cause infection. Sinusitis can develop quickly and last only a short time (acute) or continue over a long period (chronic). Sinusitis that lasts for more than 12 weeks is considered chronic.  CAUSES  Causes of sinusitis include:  Allergies.  Structural abnormalities, such as displacement of the cartilage that separates your nostrils (deviated septum), which can decrease the air flow through your nose and sinuses and affect sinus drainage.  Functional abnormalities, such as when the small hairs (cilia) that line your sinuses and help remove mucus do not work properly or are not present. SIGNS AND SYMPTOMS  Symptoms of acute and chronic sinusitis are the same. The primary symptoms are pain and pressure around the affected sinuses. Other symptoms include:  Upper toothache.  Earache.  Headache.  Bad breath.  Decreased sense of smell and taste.  A cough, which worsens when you are lying flat.  Fatigue.  Fever.  Thick drainage from your nose, which often is green and may contain pus (purulent).  Swelling and warmth over the affected sinuses. DIAGNOSIS  Your  health care provider will perform a physical exam. During the exam, your health care provider may:  Look in your nose for signs of abnormal growths in your nostrils (nasal polyps).  Tap over the affected sinus to check for signs of infection.  View the inside of your sinuses (endoscopy) using an imaging device that has a light attached (endoscope). If your health care provider suspects that you have chronic sinusitis, one or more of the following tests may be recommended:  Allergy tests.  Nasal culture. A sample of mucus is taken from your nose, sent to a lab, and screened for bacteria.  Nasal cytology. A sample of mucus is taken from your nose and examined by your health care provider to determine if your sinusitis is related to an allergy. TREATMENT  Most cases of acute sinusitis are related to a viral infection and will resolve on their own within 10 days. Sometimes medicines are prescribed to help relieve symptoms (pain medicine, decongestants, nasal steroid sprays, or saline sprays).  However, for sinusitis related to a bacterial infection, your health care provider will prescribe antibiotic medicines. These are medicines that will help kill the bacteria causing the infection.  Rarely, sinusitis is caused by a fungal infection. In theses cases, your health care provider will prescribe antifungal medicine. For some cases of chronic sinusitis, surgery is needed. Generally, these are cases in which sinusitis recurs more than 3 times per year, despite other treatments. HOME CARE INSTRUCTIONS   Drink plenty of water. Water helps thin  the mucus so your sinuses can drain more easily.  Use a humidifier.  Inhale steam 3 to 4 times a day (for example, sit in the bathroom with the shower running).  Apply a warm, moist washcloth to your face 3 to 4 times a day, or as directed by your health care provider.  Use saline nasal sprays to help moisten and clean your sinuses.  Take medicines only as  directed by your health care provider.  If you were prescribed either an antibiotic or antifungal medicine, finish it all even if you start to feel better. SEEK IMMEDIATE MEDICAL CARE IF:  You have increasing pain or severe headaches.  You have nausea, vomiting, or drowsiness.  You have swelling around your face.  You have vision problems.  You have a stiff neck.  You have difficulty breathing. MAKE SURE YOU:   Understand these instructions.  Will watch your condition.  Will get help right away if you are not doing well or get worse. Document Released: 05/13/2005 Document Revised: 09/27/2013 Document Reviewed: 05/28/2011 [facility] Patient Information 2015 [place], [place]. This information is not intended to replace advice given to you by your health care provider. Make sure you discuss any questions you have with your health care provider.

## 2014-12-19 NOTE — Progress Notes (Addendum)
Subjective:    Patient ID: Gerald Johnson, male    DOB: [birth year], 54 y.o.   MRN: [id number]  HPI  54 year old healthy male who presents to the office for sinus congestion, weakness, pain under left eye since Saturday.On Saturday he had rhinorrhea and took Zyrtec which made him sleepy. On Sunday he had sinus pressure under his left eye. Continues have rhinorrhea. The pain under his left eye "comes and goes." Described as "dull". In addition he has generalized malaise as well as nausea in the morning from the PND.   Denies cough, SOB or fever.   Review of Systems   Constitutional: Negative.   HENT: Positive for congestion, postnasal drip, rhinorrhea and sinus pressure. Negative for ear discharge, ear pain, facial swelling, hearing loss, nosebleeds, sore throat and trouble swallowing.   Eyes: Negative.   Respiratory: Negative.   Cardiovascular: Negative.   Musculoskeletal: Positive for myalgias.  Neurological: Negative.   All other systems reviewed and are negative.  Past Medical History  Diagnosis Date  . Acute prostatitis 12/02/2007  . ALLERGIC RHINITIS 02/26/2007  . 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 HYPERLIPIDEMIA 02/26/2007  . Sciatica 05/04/2008  . SINUSITIS, ACUTE NOS 02/26/2007  . SINUSITIS, RECURRENT 02/28/2010  . Asthma   . Hemorrhoids   . Allergy     History   Social History  . Marital Status: Single    Spouse Name: N/A  . Number of Children: N/A  . Years of Education: N/A   Occupational History  . Not on file.   Social History Main Topics  . Smoking status: Never Smoker   . Smokeless tobacco: Never Used  . Alcohol Use: 0.0 oz/week    0 Standard drinks or equivalent per week     Comment: occ  . Drug Use: No  . Sexual Activity: Yes    Birth Control/ Protection: Condom   Other Topics Concern  . Not on file   Social History Narrative    Past Surgical History  Procedure Laterality Date  . Wisdom tooth extraction      Family History    Problem Relation Age of Onset  . Diabetes Brother   . Colon cancer Neg Hx   . Esophageal cancer Neg Hx   . Rectal cancer Neg Hx   . Stomach cancer Neg Hx     No Known Allergies  Current Outpatient Prescriptions on File Prior to Visit  Medication Sig Dispense Refill  . Aspirin-Acetaminophen-Caffeine (EXCEDRIN PO) Take by mouth as needed.     No current facility-administered medications on file prior to visit.    BP 110/82 mmHg  Temp(Src) 98.2 F (36.8 C) (Oral)  Ht 6\' 2"  (1.88 m)  Wt 249 lb 1.6 oz (112.991 kg)  BMI 31.97 kg/m2       Objective:   Physical Exam  Constitutional: He is oriented to person, place, and time. He appears well-developed and well-nourished. No distress.  HENT:  Head: Normocephalic and atraumatic.  Right Ear: External ear normal.  Left Ear: External ear normal.  Nose: Nose normal.  Mouth/Throat: Oropharynx is clear and moist. No oropharyngeal exudate.  Eyes: Conjunctivae and EOM are normal. Pupils are equal, round, and reactive to light. Right eye exhibits no discharge. Left eye exhibits no discharge.  Cardiovascular: Normal rate, regular rhythm, normal heart sounds and intact distal pulses.  Exam reveals no gallop and no friction rub.   No murmur heard. Pulmonary/Chest: Effort normal and breath sounds normal. No respiratory distress. He  has no wheezes. He has no rales. He exhibits no tenderness.  Musculoskeletal: Normal range of motion. He exhibits no edema or tenderness.  Lymphadenopathy:    He has no cervical adenopathy.  Neurological: He is alert and oriented to person, place, and time.  Skin: Skin is warm and dry. No rash noted. He is not diaphoretic. No erythema. No pallor.  Psychiatric: He has a normal mood and affect. His behavior is normal. Judgment and thought content normal.  Nursing note and vitals reviewed.      Assessment & Plan:  1. Acute maxillary sinusitis, recurrence not specified - Likely viral  - Explained to the patient  that there was no need for abx at this time.  -Recommended Claritin D and Flonase - ondansetron (ZOFRAN) 4 MG tablet; Take 1 tablet (4 mg total) by mouth every 8 (eight) hours as needed for nausea or vomiting.  Dispense: 20 tablet; Refill: 0 - Follow up if no improvement in 3-4 days or sooner if symptoms worsen or fever greater than 101F

## 2015-01-10 ENCOUNTER — Other Ambulatory Visit: Payer: Self-pay | Admitting: Family

## 2015-01-27 ENCOUNTER — Telehealth: Payer: Self-pay | Admitting: Family

## 2015-01-27 ENCOUNTER — Ambulatory Visit (INDEPENDENT_AMBULATORY_CARE_PROVIDER_SITE_OTHER): Payer: 59 | Admitting: Family Medicine

## 2015-01-27 ENCOUNTER — Encounter: Payer: Self-pay | Admitting: Family Medicine

## 2015-01-27 VITALS — BP 108/66 | HR 83 | Temp 98.0°F | Ht 74.0 in | Wt 250.0 lb

## 2015-01-27 DIAGNOSIS — M109 Gout, unspecified: Secondary | ICD-10-CM | POA: Insufficient documentation

## 2015-01-27 MED ORDER — PREDNISONE 10 MG PO TABS: ORAL_TABLET | ORAL | Status: DC

## 2015-01-27 MED ORDER — HYDROCODONE-ACETAMINOPHEN 10-325 MG PO TABS: 1.0000 | ORAL_TABLET | Freq: Four times a day (QID) | ORAL | Status: DC | PRN

## 2015-01-27 NOTE — Progress Notes (Signed)
   Subjective:    Patient ID: Gerald Johnson, male    DOB: 20-Apr-1961, 54 y.o.   MRN: [id number]  HPI Here for 2 days of sudden onset pain and swelling in the right great toe. No hx of trauma. Ibuprofen does not help. He has never had this before.    Review of Systems  Constitutional: Negative.   Respiratory: Negative.   Cardiovascular: Negative.   Musculoskeletal: Positive for joint swelling and arthralgias.       Objective:   Physical Exam  Constitutional: He appears well-developed and well-nourished.  Musculoskeletal:  The MTP of the right great toe is warm, swollen and quite tender. No erythema          Assessment & Plan:  This is gout. We discussed possible triggers for gout. Treat with a prednisone taper and Norco prn.

## 2015-01-27 NOTE — Telephone Encounter (Signed)
Name: Trayshawn [facility] DOB: 1960/06/04 Initial Comment Caller states his big toe has been hurting and the caller is having difficulty walking. Should the caller make an appointment? Nurse Assessment Nurse: Donalynn Furlong, RN, Myna Hidalgo Date/Time Eilene Ghazi Time): 01/27/2015 12:58:36 PM Confirm and document reason for call. If symptomatic, describe symptoms. ---Caller states his big toe has been hurting and the caller is having difficulty walking. Should the caller make an appointment? Caller states right foot great toe hurts, states no ingrown toenail, pus , redness etc. "Feels like its inside the toe". Afebrile."I feel it in left foot big toe as well but not as bad". Pt states he wears "steel-toe shoes everyday at work, and I think that could be it" ( I also suggested at this point, pt be re-evaluated for fit, and possible new shoes should this cont, pt states he will do this) Has the patient traveled out of the country within the last 30 days? ---No Does the patient require triage? ---Yes Related visit to physician within the last 2 weeks? ---No Does the PT have any chronic conditions? (i.e. diabetes, asthma, etc.) ---No Guidelines Guideline Title Affirmed Question Affirmed Notes Foot and Ankle Injury Minor injury or pain from direct blow or crushing injury (all triage questions negative) Final Disposition User [place], RN, Myna Hidalgo Disagree/Comply: Leta Baptist

## 2015-01-27 NOTE — Progress Notes (Signed)
Pre visit review using our clinic review tool, if applicable. No additional management support is needed unless otherwise documented below in the visit note. 

## 2015-02-14 ENCOUNTER — Other Ambulatory Visit: Payer: Self-pay | Admitting: Family

## 2015-04-10 ENCOUNTER — Other Ambulatory Visit: Payer: Self-pay | Admitting: Adult Health

## 2015-04-10 ENCOUNTER — Telehealth: Payer: Self-pay | Admitting: Family

## 2015-04-10 MED ORDER — PREDNISONE 10 MG PO TABS: ORAL_TABLET | ORAL | Status: DC

## 2015-04-10 NOTE — Telephone Encounter (Signed)
Pt has made establish appt, thanks

## 2015-04-10 NOTE — Telephone Encounter (Signed)
Left VM, sent in prednisone prescription

## 2015-04-10 NOTE — Telephone Encounter (Signed)
Pt saw dr fry on 9/2 with gout in his toe. Pt states the gout has returned and he would like a refill of  predniSONE (DELTASONE) 10 MG tablet  Pt advised to make transfer appt with cory. Pt states he will wait to see if he needs to be seen for this refill and then will address appt with cory.  [facility] / [address]

## 2015-04-10 NOTE — Telephone Encounter (Signed)
This needs to be handled by [facility]

## 2015-06-08 ENCOUNTER — Ambulatory Visit: Payer: 59 | Admitting: Adult Health

## 2015-06-13 ENCOUNTER — Ambulatory Visit: Payer: 59 | Admitting: Adult Health

## 2015-06-15 ENCOUNTER — Ambulatory Visit (INDEPENDENT_AMBULATORY_CARE_PROVIDER_SITE_OTHER): Payer: 59 | Admitting: Adult Health

## 2015-06-15 ENCOUNTER — Encounter: Payer: Self-pay | Admitting: Adult Health

## 2015-06-15 VITALS — BP 118/80 | Temp 97.9°F | Ht 74.0 in | Wt 255.7 lb

## 2015-06-15 DIAGNOSIS — J019 Acute sinusitis, unspecified: Secondary | ICD-10-CM | POA: Diagnosis not present

## 2015-06-15 DIAGNOSIS — Z7689 Persons encountering health services in other specified circumstances: Secondary | ICD-10-CM

## 2015-06-15 DIAGNOSIS — R51 Headache: Secondary | ICD-10-CM | POA: Diagnosis not present

## 2015-06-15 DIAGNOSIS — Z7189 Other specified counseling: Secondary | ICD-10-CM

## 2015-06-15 DIAGNOSIS — Z23 Encounter for immunization: Secondary | ICD-10-CM

## 2015-06-15 DIAGNOSIS — R519 Headache, unspecified: Secondary | ICD-10-CM

## 2015-06-15 MED ORDER — SUMATRIPTAN SUCCINATE 50 MG PO TABS: ORAL_TABLET | ORAL | Status: DC

## 2015-06-15 MED ORDER — DOXYCYCLINE HYCLATE 100 MG PO CAPS: 100.0000 mg | ORAL_CAPSULE | Freq: Two times a day (BID) | ORAL | Status: DC

## 2015-06-15 MED ORDER — AMITRIPTYLINE HCL 25 MG PO TABS: 25.0000 mg | ORAL_TABLET | Freq: Every day | ORAL | Status: DC

## 2015-06-15 NOTE — Progress Notes (Signed)
HPI:  Gerald Johnson is here to establish care. He is a pleasant AA male who  has a past medical history of Acute prostatitis (12/02/2007); ALLERGIC RHINITIS (02/26/2007); ERECTILE DYSFUNCTION, NON-ORGANIC, MILD (11/08/2008); HYPERLIPIDEMIA (02/26/2007); Sciatica (05/04/2008); SINUSITIS, RECURRENT (02/28/2010); Asthma; Hemorrhoids; and Migraines.  Last PCP and physical: one year ago with NP Gerald Johnson Immunizations: UTD Diet: Does not follow a specific diet Exercise: Lifts weights.  Colonoscopy:2014 - 10 year plan   Has the following chronic problems that require follow up and concerns today:  Migraines - He has migraine headaches about once a week. He uses Imitrex which he endorses helps elevate the migraine but it makes him nauseated.   Sinusitis - Chronic issues for the patient. This most recent sinusitis has lasted about two weeks. He denies any fevers. Has not been using anything over the counter. He has seen ENT in the past but " they didn't know what was wrong with me."   ROS negative for unless reported above: fevers, chills,feeling poorly, unintentional weight loss, hearing or vision loss, chest pain, palpitations, leg claudication, struggling to breath,Not feeling congested in the chest, no orthopenia, no cough,no wheezing, normal appetite, no soft tissue swelling, no hemoptysis, melena, hematochezia, hematuria, falls, loc, si, or thoughts of self harm.     Past Medical History  Diagnosis Date  . Acute prostatitis 12/02/2007  . ALLERGIC RHINITIS 02/26/2007  . 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 HYPERLIPIDEMIA 02/26/2007  . Sciatica 05/04/2008  . SINUSITIS, ACUTE NOS 02/26/2007  . SINUSITIS, RECURRENT 02/28/2010  . Asthma   . Hemorrhoids   . Allergy     Past Surgical History  Procedure Laterality Date  . Wisdom tooth extraction      Family History  Problem Relation Age of Onset  . Diabetes Brother   . Colon cancer Neg Hx   . Esophageal cancer Neg Hx   .  Rectal cancer Neg Hx   . Stomach cancer Neg Hx     Social History   Social History  . Marital Status: Single    Spouse Name: N/A  . Number of Children: N/A  . Years of Education: N/A   Social History Main Topics  . Smoking status: Never Smoker   . Smokeless tobacco: Never Used  . Alcohol Use: 0.0 oz/week    0 Standard drinks or equivalent per week     Comment: occ  . Drug Use: No  . Sexual Activity: Yes    Birth Control/ Protection: Condom   Other Topics Concern  . None   Social History Narrative     Current outpatient prescriptions:  .  Aspirin-Acetaminophen-Caffeine (EXCEDRIN PO), Take by mouth as needed., Disp: , Rfl:  .  HYDROcodone-acetaminophen (NORCO) 10-325 MG per tablet, Take 1 tablet by mouth every 6 (six) hours as needed for severe pain., Disp: 30 tablet, Rfl: 0 .  SUMAtriptan (IMITREX) 50 MG tablet, TAKE 1 TABLET BY MOUTH EVERY 2 HOURS AS NEEDED FOR MIGRAINE. MAY REPEAT IN 2 HOURS IF HEADACHE PERSISTS OR RECURS (Patient not taking: Reported on 06/15/2015), Disp: 10 tablet, Rfl: 0  EXAM:  Filed Vitals:   06/15/15 1432  BP: 118/80  Temp: 97.9 F (36.6 C)    Body mass index is 32.82 kg/(m^2).  GENERAL: vitals reviewed and listed above, alert, oriented, appears well hydrated and in no acute distress  HEENT: atraumatic, conjunttiva clear, no obvious abnormalities on inspection of external nose and ears. Clear drainage in both nostrils. Clear post nasal drip  NECK: Neck is soft and supple without masses, no adenopathy or thyromegaly, trachea midline, no JVD. Normal range of motion.   LUNGS: clear to auscultation bilaterally, no wheezes, rales or rhonchi, good air movement  CV: Regular rate and rhythm, normal S1/S2, no audible murmurs, gallops, or rubs. No carotid bruit and no peripheral edema.   MS: moves all extremities without noticeable abnormality. No edema noted  Abd: soft/nontender/nondistended/normal bowel sounds   Skin: warm and dry, no rash    Extremities: No clubbing, cyanosis, or edema. Capillary refill is WNL. Pulses intact bilaterally in upper and lower extremities.   Neuro: CN II-XII intact, sensation and reflexes normal throughout, 5/5 muscle strength in bilateral upper and lower extremities. Normal finger to nose. Normal rapid alternating movements. Normal romberg. No pronator drift.   PSYCH: pleasant and cooperative, no obvious depression or anxiety  ASSESSMENT AND PLAN:  1. Encounter to establish care - Follow up for physical  - Follow up sooner if needed  2. Encounter for immunization Flu shot given   3. SINUSITIS, ACUTE NOS - doxycycline (VIBRAMYCIN) 100 MG capsule; Take 1 capsule (100 mg total) by mouth 2 (two) times daily.  Dispense: 14 capsule; Refill: 0 - consider going back to ENT - Trial Flonase 4. Headache behind the eyes  - SUMAtriptan (IMITREX) 50 MG tablet; TAKE 1 TABLET BY MOUTH EVERY 2 HOURS AS NEEDED FOR MIGRAINE. MAY REPEAT IN 2 HOURS IF HEADACHE PERSISTS OR RECURS  Dispense: 10 tablet; Refill: 3 ( take as needed)  - amitriptyline (ELAVIL) 25 MG tablet; Take 1 tablet (25 mg total) by mouth at bedtime.  Dispense: 30 tablet; Refill: 3  -We reviewed the PMH, PSH, FH, SH, Meds and Allergies. -We provided refills for any medications we will prescribe as needed. -We addressed current concerns per orders and patient instructions. -We have asked for records for pertinent exams, studies, vaccines and notes from previous providers. -We have advised patient to follow up per instructions below.   -Patient advised to return or notify a provider immediately if symptoms worsen or persist or new concerns arise.  There are no Patient Instructions on file for this visit.   [name], AGNP

## 2015-06-15 NOTE — Patient Instructions (Signed)
It was great seeing you again!  I have sent in prescriptions for Amitriptyline and Imitrex. Take the Amitriptyline every night to see if this helps get rid of your migraines.   Take the doxycycline twice a day for 7 days.   Follow up with me for your physical.

## 2015-08-09 ENCOUNTER — Other Ambulatory Visit (INDEPENDENT_AMBULATORY_CARE_PROVIDER_SITE_OTHER): Payer: 59

## 2015-08-09 DIAGNOSIS — Z0000 Encounter for general adult medical examination without abnormal findings: Secondary | ICD-10-CM

## 2015-08-09 LAB — POC URINALSYSI DIPSTICK (AUTOMATED)
BILIRUBIN UA: NEGATIVE
GLUCOSE UA: NEGATIVE
Ketones, UA: NEGATIVE
Leukocytes, UA: NEGATIVE
Nitrite, UA: NEGATIVE
Protein, UA: NEGATIVE
SPEC GRAV UA: 1.025
UROBILINOGEN UA: 0.2
pH, UA: 6

## 2015-08-09 LAB — CBC WITH DIFFERENTIAL/PLATELET
BASOS ABS: 0 10*3/uL (ref 0.0–0.1)
Basophils Relative: 0.5 % (ref 0.0–3.0)
EOS ABS: 0.2 10*3/uL (ref 0.0–0.7)
Eosinophils Relative: 3.7 % (ref 0.0–5.0)
HCT: 46.7 % (ref 39.0–52.0)
Hemoglobin: 15.9 g/dL (ref 13.0–17.0)
LYMPHS ABS: 2.2 10*3/uL (ref 0.7–4.0)
Lymphocytes Relative: 33.5 % (ref 12.0–46.0)
MCHC: 34.1 g/dL (ref 30.0–36.0)
MCV: 84.8 fl (ref 78.0–100.0)
MONO ABS: 0.5 10*3/uL (ref 0.1–1.0)
Monocytes Relative: 8.4 % (ref 3.0–12.0)
NEUTROS ABS: 3.5 10*3/uL (ref 1.4–7.7)
NEUTROS PCT: 53.9 % (ref 43.0–77.0)
PLATELETS: 242 10*3/uL (ref 150.0–400.0)
RBC: 5.51 Mil/uL (ref 4.22–5.81)
RDW: 13.8 % (ref 11.5–15.5)
WBC: 6.5 10*3/uL (ref 4.0–10.5)

## 2015-08-09 LAB — LIPID PANEL
Cholesterol: 224 mg/dL — ABNORMAL HIGH (ref 0–200)
HDL: 41.9 mg/dL (ref >39.00)
LDL CALC: 161 mg/dL — AB (ref 0–99)
NONHDL: 182.28
Total CHOL/HDL Ratio: 5
Triglycerides: 106 mg/dL (ref 0.0–149.0)
VLDL: 21.2 mg/dL (ref 0.0–40.0)

## 2015-08-09 LAB — HEPATIC FUNCTION PANEL
ALBUMIN: 4 g/dL (ref 3.5–5.2)
ALK PHOS: 59 U/L (ref 39–117)
ALT: 32 U/L (ref 0–53)
AST: 25 U/L (ref 0–37)
BILIRUBIN TOTAL: 1 mg/dL (ref 0.2–1.2)
Bilirubin, Direct: 0.2 mg/dL (ref 0.0–0.3)
Total Protein: 6.8 g/dL (ref 6.0–8.3)

## 2015-08-09 LAB — BASIC METABOLIC PANEL
BUN: 19 mg/dL (ref 6–23)
CALCIUM: 9.4 mg/dL (ref 8.4–10.5)
CO2: 26 meq/L (ref 19–32)
CREATININE: 1.24 mg/dL (ref 0.40–1.50)
Chloride: 104 mEq/L (ref 96–112)
GFR: 77.92 mL/min (ref >60.00)
GLUCOSE: 95 mg/dL (ref 70–99)
Potassium: 4.3 mEq/L (ref 3.5–5.1)
SODIUM: 140 meq/L (ref 135–145)

## 2015-08-09 LAB — PSA: PSA: 3.55 ng/mL (ref 0.10–4.00)

## 2015-08-09 LAB — TSH: TSH: 0.92 u[IU]/mL (ref 0.35–4.50)

## 2015-08-16 ENCOUNTER — Ambulatory Visit (INDEPENDENT_AMBULATORY_CARE_PROVIDER_SITE_OTHER): Payer: 59 | Admitting: Adult Health

## 2015-08-16 VITALS — BP 144/70 | Temp 97.5°F | Ht 74.0 in | Wt 251.0 lb

## 2015-08-16 DIAGNOSIS — J019 Acute sinusitis, unspecified: Secondary | ICD-10-CM

## 2015-08-16 DIAGNOSIS — Z0000 Encounter for general adult medical examination without abnormal findings: Secondary | ICD-10-CM | POA: Diagnosis not present

## 2015-08-16 DIAGNOSIS — M109 Gout, unspecified: Secondary | ICD-10-CM

## 2015-08-16 DIAGNOSIS — R51 Headache: Secondary | ICD-10-CM | POA: Diagnosis not present

## 2015-08-16 DIAGNOSIS — R519 Headache, unspecified: Secondary | ICD-10-CM

## 2015-08-16 MED ORDER — DOXYCYCLINE HYCLATE 100 MG PO CAPS: 100.0000 mg | ORAL_CAPSULE | Freq: Two times a day (BID) | ORAL | Status: DC

## 2015-08-16 MED ORDER — INDOMETHACIN 50 MG PO CAPS: 50.0000 mg | ORAL_CAPSULE | Freq: Two times a day (BID) | ORAL | Status: DC

## 2015-08-16 MED ORDER — PREDNISONE 20 MG PO TABS: ORAL_TABLET | ORAL | Status: DC

## 2015-08-16 MED ORDER — SUMATRIPTAN SUCCINATE 50 MG PO TABS: ORAL_TABLET | ORAL | Status: DC

## 2015-08-16 MED ORDER — AMITRIPTYLINE HCL 25 MG PO TABS: 25.0000 mg | ORAL_TABLET | Freq: Every day | ORAL | Status: DC

## 2015-08-16 NOTE — Progress Notes (Signed)
Subjective:    Patient ID: Gerald Johnson, male    DOB: 1960/12/30, 55 y.o.   MRN: [id number]  HPI  Patient presents for yearly preventative medicine examination.  All immunizations and health maintenance protocols were reviewed with the patient and needed orders were placed.  Medication reconciliation,  past medical history, social history, problem list and allergies were reviewed in detail with the patient  Goals were established with regard to weight loss, exercise, and  diet in compliance with medications  He is up to date on his Colonoscopy, he has seen a eye doctor and dentist within the last year.  He reports that he is not exercising and his diet has been suffering as well.   He reports that his migraines have been significantly less in number since starting amitriptyline.  Acute problems 1. He has had 2 recent gout flares. The most recent one in his left great toe, he reports that it is resolving regular NSAID use.  2. He has been suffering from an upper respiratory infection for the last 2 weeks. His symptoms include nasal congestion and drainage, sinus pain and pressure, and the feeling of ear fullness.    Review of Systems  Constitutional: Negative.   HENT: Positive for congestion, ear pain, rhinorrhea and sinus pressure. Negative for ear discharge, postnasal drip, sore throat and trouble swallowing.   Eyes: Negative.   Respiratory: Negative.   Cardiovascular: Negative.   Gastrointestinal: Negative.   Endocrine: Negative.   Genitourinary: Negative.   Musculoskeletal: Positive for joint swelling (Left great toe).  Skin: Negative.   Allergic/Immunologic: Negative.   Neurological: Negative.   Hematological: Negative.   Psychiatric/Behavioral: Negative.   All other systems reviewed and are negative.  Past Medical History  Diagnosis Date  . Acute prostatitis 12/02/2007  . ALLERGIC RHINITIS 02/26/2007  . 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  HYPERLIPIDEMIA 02/26/2007  . Sciatica 05/04/2008  . SINUSITIS, RECURRENT 02/28/2010  . Asthma   . Hemorrhoids   . Migraines     Social History   Social History  . Marital Status: Single    Spouse Name: N/A  . Number of Children: N/A  . Years of Education: N/A   Occupational History  . Not on file.   Social History Main Topics  . Smoking status: Never Smoker   . Smokeless tobacco: Never Used  . Alcohol Use: 0.0 oz/week    0 Standard drinks or equivalent per week     Comment: occ  . Drug Use: No  . Sexual Activity: Yes    Birth Control/ Protection: Condom   Other Topics Concern  . Not on file   Social History Narrative   Works as a Nature conservation officer.    Not married    No kids    He likes to go to the gym. Likes to go to go to restaurants.     Past Surgical History  Procedure Laterality Date  . Wisdom tooth extraction      Family History  Problem Relation Age of Onset  . Diabetes Brother   . Colon cancer Neg Hx   . Esophageal cancer Neg Hx   . Rectal cancer Neg Hx   . Stomach cancer Neg Hx     No Known Allergies  Current Outpatient Prescriptions on File Prior to Visit  Medication Sig Dispense Refill  . Aspirin-Acetaminophen-Caffeine (EXCEDRIN PO) Take by mouth as needed.    Marland Kitchen HYDROcodone-acetaminophen (NORCO) 10-325 MG per tablet Take 1 tablet by mouth  every 6 (six) hours as needed for severe pain. 30 tablet 0   No current facility-administered medications on file prior to visit.    BP 144/70 mmHg  Temp(Src) 97.5 F (36.4 C) (Oral)  Ht 6\' 2"  (1.88 m)  Wt 251 lb (113.853 kg)  BMI 32.21 kg/m2       Objective:   Physical Exam  Constitutional: He is oriented to person, place, and time. He appears well-developed and well-nourished. No distress.  Cardiovascular: Normal rate, regular rhythm, normal heart sounds and intact distal pulses.  Exam reveals no gallop and no friction rub.   No murmur heard. Pulmonary/Chest: Effort normal and breath sounds  normal. No respiratory distress. He has no wheezes. He has no rales. He exhibits no tenderness.  Genitourinary: Rectum normal, prostate normal and penis normal. Guaiac negative stool. No penile tenderness.  Musculoskeletal: Normal range of motion. He exhibits tenderness. He exhibits no edema.       Left ankle: He exhibits no swelling, no ecchymosis and no deformity.       Feet:  Neurological: He is alert and oriented to person, place, and time.  Skin: Skin is warm and dry. No rash noted. He is not diaphoretic. No erythema. No pallor.  Psychiatric: He has a normal mood and affect. His behavior is normal. Judgment and thought content normal.  Nursing note and vitals reviewed.     Assessment & Plan:  1. Routine general medical examination at a health care facility -Follow-up in 1 year for next physical exam; follow-up sooner if needed -Educated on the importance of eating a healthy diet and exercising on a routine basis. -We reviewed labs in  detail  2. Headache behind the eyes - amitriptyline (ELAVIL) 25 MG tablet; Take 1 tablet (25 mg total) by mouth at bedtime.  Dispense: 30 tablet; Refill: 3 - SUMAtriptan (IMITREX) 50 MG tablet; TAKE 1 TABLET BY MOUTH EVERY 2 HOURS AS NEEDED FOR MIGRAINE. MAY REPEAT IN 2 HOURS IF HEADACHE PERSISTS OR RECURS  Dispense: 10 tablet; Refill: 3  3. SINUSITIS, ACUTE NOS - doxycycline (VIBRAMYCIN) 100 MG capsule; Take 1 capsule (100 mg total) by mouth 2 (two) times daily.  Dispense: 14 capsule; Refill: 0  4. Gout without tophus, unspecified cause, unspecified chronicity, unspecified site -She continues to have frequent gout flares we discussed the possibility of going on allopurinol as prophylactic treatment. - indomethacin (INDOCIN) 50 MG capsule; Take 1 capsule (50 mg total) by mouth 2 (two) times daily with a meal.  Dispense: 60 capsule; Refill: 3 - predniSONE (DELTASONE) 20 MG tablet; 40 mg x 5 days when needed for gout flare  Dispense: 30 tablet; Refill:  3

## 2015-08-16 NOTE — Patient Instructions (Addendum)
It was great seeing you today!  As discussed your cholesterol level is alittle high, please work on diet and exercise to correct this.   I have sent in a prescription for your migraine medications as well as Indomethacin, use this medication when you feel a gout flare coming on.   You can also use the prednisone when you first feel the flare coming on.    Health Maintenance, Male A healthy lifestyle and preventative care can promote health and wellness.  Maintain regular health, dental, and eye exams.  Eat a healthy diet. Foods like vegetables, fruits, whole grains, low-fat dairy products, and lean protein foods contain the nutrients you need and are low in calories. Decrease your intake of foods high in solid fats, added sugars, and salt. Get information about a proper diet from your health care provider, if necessary.  Regular physical exercise is one of the most important things you can do for your health. Most adults should get at least 150 minutes of moderate-intensity exercise (any activity that increases your heart rate and causes you to sweat) each week. In addition, most adults need muscle-strengthening exercises on 2 or more days a week.   Maintain a healthy weight. The body mass index (BMI) is a screening tool to identify possible weight problems. It provides an estimate of body fat based on height and weight. Your health care provider can find your BMI and can help you achieve or maintain a healthy weight. For males 20 years and older:  A BMI below 18.5 is considered underweight.  A BMI of 18.5 to 24.9 is normal.  A BMI of 25 to 29.9 is considered overweight.  A BMI of 30 and above is considered obese.  Maintain normal blood lipids and cholesterol by exercising and minimizing your intake of saturated fat. Eat a balanced diet with plenty of fruits and vegetables. Blood tests for lipids and cholesterol should begin at age 2 and be repeated every 5 years. If your lipid or  cholesterol levels are high, you are over age 49, or you are at high risk for heart disease, you may need your cholesterol levels checked more frequently.Ongoing high lipid and cholesterol levels should be treated with medicines if diet and exercise are not working.  If you smoke, find out from your health care provider how to quit. If you do not use tobacco, do not start.  Lung cancer screening is recommended for adults aged 59-80 years who are at high risk for developing lung cancer because of a history of smoking. A yearly low-dose CT scan of the lungs is recommended for people who have at least a 30-pack-year history of smoking and are current smokers or have quit within the past 15 years. A pack year of smoking is smoking an average of 1 pack of cigarettes a day for 1 year (for example, a 30-pack-year history of smoking could mean smoking 1 pack a day for 30 years or 2 packs a day for 15 years). Yearly screening should continue until the smoker has stopped smoking for at least 15 years. Yearly screening should be stopped for people who develop a health problem that would prevent them from having lung cancer treatment.  If you choose to drink alcohol, do not have more than 2 drinks per day. One drink is considered to be 12 oz (360 mL) of beer, 5 oz (150 mL) of wine, or 1.5 oz (45 mL) of liquor.  Avoid the use of street drugs. Do not share  needles with anyone. Ask for help if you need support or instructions about stopping the use of drugs.  High blood pressure causes heart disease and increases the risk of stroke. High blood pressure is more likely to develop in:  People who have blood pressure in the end of the normal range (100-139/85-89 mm Hg).  People who are overweight or obese.  People who are African American.  If you are 49-23 years of age, have your blood pressure checked every 3-5 years. If you are 60 years of age or older, have your blood pressure checked every year. You should have  your blood pressure measured twice--once when you are at a hospital or clinic, and once when you are not at a hospital or clinic. Record the average of the two measurements. To check your blood pressure when you are not at a hospital or clinic, you can use:  An automated blood pressure machine at a pharmacy.  A home blood pressure monitor.  If you are 69-49 years old, ask your health care provider if you should take aspirin to prevent heart disease.  Diabetes screening involves taking a blood sample to check your fasting blood sugar level. This should be done once every 3 years after age 74 if you are at a normal weight and without risk factors for diabetes. Testing should be considered at a younger age or be carried out more frequently if you are overweight and have at least 1 risk factor for diabetes.  Colorectal cancer can be detected and often prevented. Most routine colorectal cancer screening begins at the age of 78 and continues through age 73. However, your health care provider may recommend screening at an earlier age if you have risk factors for colon cancer. On a yearly basis, your health care provider may provide home test kits to check for hidden blood in the stool. A small camera at the end of a tube may be used to directly examine the colon (sigmoidoscopy or colonoscopy) to detect the earliest forms of colorectal cancer. Talk to your health care provider about this at age 12 when routine screening begins. A direct exam of the colon should be repeated every 5-10 years through age 3, unless early forms of precancerous polyps or small growths are found.  People who are at an increased risk for hepatitis B should be screened for this virus. You are considered at high risk for hepatitis B if:  You were born in a country where hepatitis B occurs often. Talk with your health care provider about which countries are considered high risk.  Your parents were born in a high-risk country and you  have not received a shot to protect against hepatitis B (hepatitis B vaccine).  You have HIV or AIDS.  You use needles to inject street drugs.  You live with, or have sex with, someone who has hepatitis B.  You are a man who has sex with other men (MSM).  You get hemodialysis treatment.  You take certain medicines for conditions like cancer, organ transplantation, and autoimmune conditions.  Hepatitis C blood testing is recommended for all people born from [birth year] through 1965 and any individual with known risk factors for hepatitis C.  Healthy men should no longer receive prostate-specific antigen (PSA) blood tests as part of routine cancer screening. Talk to your health care provider about prostate cancer screening.  Testicular cancer screening is not recommended for adolescents or adult males who have no symptoms. Screening includes self-exam, a health care  provider exam, and other screening tests. Consult with your health care provider about any symptoms you have or any concerns you have about testicular cancer.  Practice safe sex. Use condoms and avoid high-risk sexual practices to reduce the spread of sexually transmitted infections (STIs).  You should be screened for STIs, including gonorrhea and chlamydia if:  You are sexually active and are younger than 24 years.  You are older than 24 years, and your health care provider tells you that you are at risk for this type of infection.  Your sexual activity has changed since you were last screened, and you are at an increased risk for chlamydia or gonorrhea. Ask your health care provider if you are at risk.  If you are at risk of being infected with HIV, it is recommended that you take a prescription medicine daily to prevent HIV infection. This is called pre-exposure prophylaxis (PrEP). You are considered at risk if:  You are a man who has sex with other men (MSM).  You are a heterosexual man who is sexually active with multiple  partners.  You take drugs by injection.  You are sexually active with a partner who has HIV.  Talk with your health care provider about whether you are at high risk of being infected with HIV. If you choose to begin PrEP, you should first be tested for HIV. You should then be tested every 3 months for as long as you are taking PrEP.  Use sunscreen. Apply sunscreen liberally and repeatedly throughout the day. You should seek shade when your shadow is shorter than you. Protect yourself by wearing long sleeves, pants, a wide-brimmed hat, and sunglasses year round whenever you are outdoors.  Tell your health care provider of new moles or changes in moles, especially if there is a change in shape or color. Also, tell your health care provider if a mole is larger than the size of a pencil eraser.  A one-time screening for abdominal aortic aneurysm (AAA) and surgical repair of large AAAs by ultrasound is recommended for men aged 38-75 years who are current or former smokers.  Stay current with your vaccines (immunizations).   This information is not intended to replace advice given to you by your health care provider. Make sure you discuss any questions you have with your health care provider.   Document Released: 11/09/2007 Document Revised: 06/03/2014 Document Reviewed: 10/08/2010 Elsevier Interactive Patient Education [employer].

## 2015-09-11 ENCOUNTER — Encounter: Payer: Self-pay | Admitting: Adult Health

## 2015-09-11 ENCOUNTER — Ambulatory Visit (INDEPENDENT_AMBULATORY_CARE_PROVIDER_SITE_OTHER): Payer: 59 | Admitting: Adult Health

## 2015-09-11 VITALS — BP 138/80 | Temp 98.2°F | Wt 248.6 lb

## 2015-09-11 DIAGNOSIS — J0140 Acute pansinusitis, unspecified: Secondary | ICD-10-CM | POA: Diagnosis not present

## 2015-09-11 MED ORDER — DOXYCYCLINE HYCLATE 100 MG PO CAPS: 100.0000 mg | ORAL_CAPSULE | Freq: Two times a day (BID) | ORAL | Status: DC

## 2015-09-11 NOTE — Progress Notes (Signed)
Subjective:    Patient ID: Gerald Johnson, male    DOB: 1961/02/04, 55 y.o.   MRN: [id number]  Sinusitis This is a recurrent problem. The current episode started in the past 7 days. The problem has been gradually worsening since onset. There has been no fever. The pain is mild. Associated symptoms include congestion, coughing, headaches and sinus pressure. Pertinent negatives include no chills, diaphoresis, ear pain, hoarse voice, shortness of breath, sneezing, sore throat or swollen glands. Past treatments include lying down and acetaminophen. The treatment provided mild relief.    Review of Systems  Constitutional: Negative for chills and diaphoresis.  HENT: Positive for congestion and sinus pressure. Negative for ear pain, hoarse voice, sneezing and sore throat.   Respiratory: Positive for cough. Negative for shortness of breath.   Neurological: Positive for headaches.   Past Medical History  Diagnosis Date  . Acute prostatitis 12/02/2007  . ALLERGIC RHINITIS 02/26/2007  . 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 HYPERLIPIDEMIA 02/26/2007  . Sciatica 05/04/2008  . SINUSITIS, RECURRENT 02/28/2010  . Asthma   . Hemorrhoids   . Migraines     Social History   Social History  . Marital Status: Single    Spouse Name: N/A  . Number of Children: N/A  . Years of Education: N/A   Occupational History  . Not on file.   Social History Main Topics  . Smoking status: Never Smoker   . Smokeless tobacco: Never Used  . Alcohol Use: 0.0 oz/week    0 Standard drinks or equivalent per week     Comment: occ  . Drug Use: No  . Sexual Activity: Yes    Birth Control/ Protection: Condom   Other Topics Concern  . Not on file   Social History Narrative   Works as a Nature conservation officer.    Not married    No kids    He likes to go to the gym. Likes to go to go to restaurants.     Past Surgical History  Procedure Laterality Date  . Wisdom tooth extraction      Family History    Problem Relation Age of Onset  . Diabetes Brother   . Colon cancer Neg Hx   . Esophageal cancer Neg Hx   . Rectal cancer Neg Hx   . Stomach cancer Neg Hx     No Known Allergies  Current Outpatient Prescriptions on File Prior to Visit  Medication Sig Dispense Refill  . amitriptyline (ELAVIL) 25 MG tablet Take 1 tablet (25 mg total) by mouth at bedtime. 30 tablet 3  . Aspirin-Acetaminophen-Caffeine (EXCEDRIN PO) Take by mouth as needed.    . doxycycline (VIBRAMYCIN) 100 MG capsule Take 1 capsule (100 mg total) by mouth 2 (two) times daily. 14 capsule 0  . HYDROcodone-acetaminophen (NORCO) 10-325 MG per tablet Take 1 tablet by mouth every 6 (six) hours as needed for severe pain. 30 tablet 0  . indomethacin (INDOCIN) 50 MG capsule Take 1 capsule (50 mg total) by mouth 2 (two) times daily with a meal. 60 capsule 3  . predniSONE (DELTASONE) 20 MG tablet 40 mg x 5 days when needed for gout flare 30 tablet 3  . SUMAtriptan (IMITREX) 50 MG tablet TAKE 1 TABLET BY MOUTH EVERY 2 HOURS AS NEEDED FOR MIGRAINE. MAY REPEAT IN 2 HOURS IF HEADACHE PERSISTS OR RECURS 10 tablet 3   No current facility-administered medications on file prior to visit.    BP 138/80 mmHg  Temp(Src)  98.2 F (36.8 C) (Oral)  Wt 248 lb 9.6 oz (112.764 kg)       Objective:   Physical Exam  Constitutional: He is oriented to person, place, and time. He appears well-developed and well-nourished. No distress.  Appears tired and worn out  HENT:  Head: Normocephalic and atraumatic.  Right Ear: External ear normal.  Left Ear: External ear normal.  Nose: Right sinus exhibits maxillary sinus tenderness and frontal sinus tenderness. Left sinus exhibits maxillary sinus tenderness and frontal sinus tenderness.  Mouth/Throat: Oropharynx is clear and moist. No oropharyngeal exudate.  Eyes: Conjunctivae and EOM are normal. Pupils are equal, round, and reactive to light. Right eye exhibits no discharge. Left eye exhibits no  discharge.  Cardiovascular: Normal rate, regular rhythm, normal heart sounds and intact distal pulses.  Exam reveals no gallop and no friction rub.   No murmur heard. Pulmonary/Chest: Effort normal and breath sounds normal. No respiratory distress. He has no wheezes. He has no rales. He exhibits no tenderness.  Neurological: He is alert and oriented to person, place, and time.  Skin: Skin is warm and dry. No rash noted. He is not diaphoretic. No erythema. No pallor.  Psychiatric: He has a normal mood and affect. His behavior is normal. Judgment and thought content normal.  Nursing note and vitals reviewed.      Assessment & Plan:  1. Acute pansinusitis, recurrence not specified - doxycycline (VIBRAMYCIN) 100 MG capsule; Take 1 capsule (100 mg total) by mouth 2 (two) times daily.  Dispense: 14 capsule; Refill: 0 - Flonase - Normal saline nasal spray - Follow up as needed  [name], NP

## 2015-09-11 NOTE — Patient Instructions (Addendum)
Your exam is consistent with a sinus infection.   I have called in a prescription for doxycycline. Please take this twice a day for 7 days.,   Use Flonase or normal saline spray   Follow up if no improvement   Sinusitis, Adult Sinusitis is redness, soreness, and inflammation of the paranasal sinuses. Paranasal sinuses are air pockets within the bones of your face. They are located beneath your eyes, in the middle of your forehead, and above your eyes. In healthy paranasal sinuses, mucus is able to drain out, and air is able to circulate through them by way of your nose. However, when your paranasal sinuses are inflamed, mucus and air can become trapped. This can allow bacteria and other germs to grow and cause infection. Sinusitis can develop quickly and last only a short time (acute) or continue over a long period (chronic). Sinusitis that lasts for more than 12 weeks is considered chronic. CAUSES Causes of sinusitis include:  Allergies.  Structural abnormalities, such as displacement of the cartilage that separates your nostrils (deviated septum), which can decrease the air flow through your nose and sinuses and affect sinus drainage.  Functional abnormalities, such as when the small hairs (cilia) that line your sinuses and help remove mucus do not work properly or are not present. SIGNS AND SYMPTOMS Symptoms of acute and chronic sinusitis are the same. The primary symptoms are pain and pressure around the affected sinuses. Other symptoms include:  Upper toothache.  Earache.  Headache.  Bad breath.  Decreased sense of smell and taste.  A cough, which worsens when you are lying flat.  Fatigue.  Fever.  Thick drainage from your nose, which often is green and may contain pus (purulent).  Swelling and warmth over the affected sinuses. DIAGNOSIS Your health care provider will perform a physical exam. During your exam, your health care provider may perform any of the following  to help determine if you have acute sinusitis or chronic sinusitis:  Look in your nose for signs of abnormal growths in your nostrils (nasal polyps).  Tap over the affected sinus to check for signs of infection.  View the inside of your sinuses using an imaging device that has a light attached (endoscope). If your health care provider suspects that you have chronic sinusitis, one or more of the following tests may be recommended:  Allergy tests.  Nasal culture. A sample of mucus is taken from your nose, sent to a lab, and screened for bacteria.  Nasal cytology. A sample of mucus is taken from your nose and examined by your health care provider to determine if your sinusitis is related to an allergy. TREATMENT Most cases of acute sinusitis are related to a viral infection and will resolve on their own within 10 days. Sometimes, medicines are prescribed to help relieve symptoms of both acute and chronic sinusitis. These may include pain medicines, decongestants, nasal steroid sprays, or saline sprays. However, for sinusitis related to a bacterial infection, your health care provider will prescribe antibiotic medicines. These are medicines that will help kill the bacteria causing the infection. Rarely, sinusitis is caused by a fungal infection. In these cases, your health care provider will prescribe antifungal medicine. For some cases of chronic sinusitis, surgery is needed. Generally, these are cases in which sinusitis recurs more than 3 times per year, despite other treatments. HOME CARE INSTRUCTIONS  Drink plenty of water. Water helps thin the mucus so your sinuses can drain more easily.  Use a humidifier.  Inhale steam 3-4 times a day (for example, sit in the bathroom with the shower running).  Apply a warm, moist washcloth to your face 3-4 times a day, or as directed by your health care provider.  Use saline nasal sprays to help moisten and clean your sinuses.  Take medicines only  as directed by your health care provider.  If you were prescribed either an antibiotic or antifungal medicine, finish it all even if you start to feel better. SEEK IMMEDIATE MEDICAL CARE IF:  You have increasing pain or severe headaches.  You have nausea, vomiting, or drowsiness.  You have swelling around your face.  You have vision problems.  You have a stiff neck.  You have difficulty breathing.   This information is not intended to replace advice given to you by your health care provider. Make sure you discuss any questions you have with your health care provider.   Document Released: 05/13/2005 Document Revised: 06/03/2014 Document Reviewed: 05/28/2011 Elsevier Interactive Patient Education [employer].

## 2016-03-29 ENCOUNTER — Ambulatory Visit (INDEPENDENT_AMBULATORY_CARE_PROVIDER_SITE_OTHER): Payer: 59 | Admitting: Adult Health

## 2016-03-29 ENCOUNTER — Encounter: Payer: Self-pay | Admitting: Adult Health

## 2016-03-29 VITALS — BP 120/80 | Temp 98.1°F | Ht 74.0 in | Wt 246.2 lb

## 2016-03-29 DIAGNOSIS — J302 Other seasonal allergic rhinitis: Secondary | ICD-10-CM | POA: Diagnosis not present

## 2016-03-29 NOTE — Progress Notes (Signed)
Subjective:    Patient ID: Gerald Johnson, male    DOB: [birth year], 55 y.o.   MRN: [id number]  HPI  55 year old male who presents to the office with the complaint of headaches, runny nose, post nasal drip, itchy watery eyes, and sinus pain and pressure " behind my eyes"  He denies any fevers, nausea, vomiting, diarrhea.   Review of Systems  Constitutional: Negative.   HENT: Positive for congestion, postnasal drip, rhinorrhea, sinus pressure and voice change. Negative for ear discharge, ear pain, sore throat and trouble swallowing.   Eyes: Positive for discharge and itching. Negative for photophobia, pain, redness and visual disturbance.  Respiratory: Negative.   Cardiovascular: Negative.   Gastrointestinal: Negative.   Musculoskeletal: Negative.   Neurological: Positive for headaches.  All other systems reviewed and are negative.  Past Medical History:  Diagnosis Date  . Acute prostatitis 12/02/2007  . ALLERGIC RHINITIS 02/26/2007  . Asthma   . 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 Hemorrhoids   . HYPERLIPIDEMIA 02/26/2007  . Migraines   . Sciatica 05/04/2008  . SINUSITIS, RECURRENT 02/28/2010    Social History   Social History  . Marital status: Single    Spouse name: N/A  . Number of children: N/A  . Years of education: N/A   Occupational History  . Not on file.   Social History Main Topics  . Smoking status: Never Smoker  . Smokeless tobacco: Never Used  . Alcohol use 0.0 oz/week     Comment: occ  . Drug use: No  . Sexual activity: Yes    Birth control/ protection: Condom   Other Topics Concern  . Not on file   Social History Narrative   Works as a Nature conservation officer.    Not married    No kids    He likes to go to the gym. Likes to go to go to restaurants.     Past Surgical History:  Procedure Laterality Date  . WISDOM TOOTH EXTRACTION      Family History  Problem Relation Age of Onset  . Diabetes Brother   . Colon cancer Neg Hx   .  Esophageal cancer Neg Hx   . Rectal cancer Neg Hx   . Stomach cancer Neg Hx     No Known Allergies  Current Outpatient Prescriptions on File Prior to Visit  Medication Sig Dispense Refill  . amitriptyline (ELAVIL) 25 MG tablet Take 1 tablet (25 mg total) by mouth at bedtime. 30 tablet 3  . Aspirin-Acetaminophen-Caffeine (EXCEDRIN PO) Take by mouth as needed.    Marland Kitchen HYDROcodone-acetaminophen (NORCO) 10-325 MG per tablet Take 1 tablet by mouth every 6 (six) hours as needed for severe pain. 30 tablet 0  . indomethacin (INDOCIN) 50 MG capsule Take 1 capsule (50 mg total) by mouth 2 (two) times daily with a meal. 60 capsule 3  . SUMAtriptan (IMITREX) 50 MG tablet TAKE 1 TABLET BY MOUTH EVERY 2 HOURS AS NEEDED FOR MIGRAINE. MAY REPEAT IN 2 HOURS IF HEADACHE PERSISTS OR RECURS 10 tablet 3   No current facility-administered medications on file prior to visit.     BP 120/80   Temp 98.1 F (36.7 C) (Oral)   Ht 6\' 2"  (1.88 m)   Wt 246 lb 3.2 oz (111.7 kg)   BMI 31.61 kg/m       Objective:   Physical Exam  Constitutional: He is oriented to person, place, and time. He appears well-developed and well-nourished. No distress.  HENT:  Head: Normocephalic and atraumatic.  Right Ear: Hearing, tympanic membrane, external ear and ear canal normal. Tympanic membrane is not erythematous and not bulging.  Left Ear: Hearing, tympanic membrane, external ear and ear canal normal. Tympanic membrane is not erythematous and not bulging.  Nose: Mucosal edema and rhinorrhea present. Right sinus exhibits no maxillary sinus tenderness and no frontal sinus tenderness. Left sinus exhibits no maxillary sinus tenderness and no frontal sinus tenderness.  Mouth/Throat: Uvula is midline. Oropharyngeal exudate present. No posterior oropharyngeal edema, posterior oropharyngeal erythema or tonsillar abscesses.  Eyes: Conjunctivae and EOM are normal. Pupils are equal, round, and reactive to light. Right eye exhibits discharge  (watery ). Right eye exhibits no exudate. Left eye exhibits discharge (watery ). Left eye exhibits no exudate. No scleral icterus.  Neck: Normal range of motion. Neck supple. No JVD present. No tracheal deviation present. No thyromegaly present.  Cardiovascular: Normal rate, regular rhythm, normal heart sounds and intact distal pulses.  Exam reveals no gallop and no friction rub.   No murmur heard. Pulmonary/Chest: Effort normal and breath sounds normal. No stridor. No respiratory distress. He has no wheezes. He has no rales. He exhibits no tenderness.  Lymphadenopathy:    He has no cervical adenopathy.  Neurological: He is alert and oriented to person, place, and time.  Skin: Skin is warm and dry. No rash noted. He is not diaphoretic. No erythema. No pallor.  Psychiatric: He has a normal mood and affect. His behavior is normal. Thought content normal.  Nursing note and vitals reviewed.     Assessment & Plan:  1. Acute seasonal allergic rhinitis, unspecified trigger - No signs of bacterial infection  - Appears as seasonal allergies  - Advised Flonase and Claritin  - Follow up if no improvement   [name], NP  -

## 2016-06-20 ENCOUNTER — Encounter: Payer: Self-pay | Admitting: Adult Health

## 2016-06-20 ENCOUNTER — Ambulatory Visit (INDEPENDENT_AMBULATORY_CARE_PROVIDER_SITE_OTHER): Payer: 59 | Admitting: Adult Health

## 2016-06-20 VITALS — BP 132/62 | Temp 98.2°F | Ht 74.0 in | Wt 244.8 lb

## 2016-06-20 DIAGNOSIS — M25562 Pain in left knee: Secondary | ICD-10-CM

## 2016-06-20 MED ORDER — METHYLPREDNISOLONE ACETATE 80 MG/ML IJ SUSP
80.0000 mg | Freq: Once | INTRAMUSCULAR | Status: AC
  Administered 2016-06-20: 80 mg via INTRA_ARTICULAR

## 2016-06-20 NOTE — Progress Notes (Signed)
   Subjective:    Patient ID: Gerald Johnson, male    DOB: 06/26/1960, 56 y.o.   MRN: [id number]  Knee Pain   The incident occurred 12 to 24 hours ago. The incident occurred at work. There was no injury mechanism. The pain is present in the left knee. Quality: dull. The pain is at a severity of 6/10. The pain has been constant since onset. Associated symptoms include an inability to bear weight. Pertinent negatives include no loss of sensation, muscle weakness or numbness. The symptoms are aggravated by movement and weight bearing. He has tried NSAIDs for the symptoms. The treatment provided moderate relief.       Review of Systems  Musculoskeletal: Positive for arthralgias and gait problem. Negative for joint swelling.  Skin: Negative.   Neurological: Negative for numbness.  All other systems reviewed and are negative.      Objective:   Physical Exam  Constitutional: He is oriented to person, place, and time. He appears well-developed and well-nourished. No distress.  Cardiovascular: Regular rhythm.  Exam reveals no gallop.   Musculoskeletal: He exhibits tenderness.       Left knee: He exhibits bony tenderness. He exhibits normal range of motion, no swelling, no effusion, normal meniscus and no MCL laxity. No medial joint line, no lateral joint line, no MCL, no LCL and no patellar tendon tenderness noted.  Positive straight leg raise.  Negative knee to chest, anterior drawer, internal and external rotation   Neurological: He is alert and oriented to person, place, and time. He has normal reflexes.  Skin: Skin is warm and dry. No rash noted. He is not diaphoretic. No erythema. No pallor.  Psychiatric: He has a normal mood and affect. His behavior is normal. Judgment and thought content normal.  Nursing note and vitals reviewed.     Assessment & Plan:  1. Acute pain of left knee - Does not appear as MCL/LCL/ Meniscus tear - Does not appear to be gout  - Likely arthritic pain. We  discussed options and decided on knee injection   Discussed risks and benefits of corticosteroid injection and patient consented.  After prepping skin with betadine, injected 80 mg depomedrol and 2 cc of plain xylocaine with 22 gauge one and one half inch needle using anterolateral approach and pt tolerated well.  Continue to use Advil Q8H PRN  - ICE - Elevation and rest  Follow up if no improvement   [name], NP

## 2016-06-24 ENCOUNTER — Telehealth: Payer: Self-pay | Admitting: Adult Health

## 2016-06-24 NOTE — Telephone Encounter (Signed)
Pt was seen on 06-20-16 and still having knee pain. Please advise

## 2016-06-25 NOTE — Telephone Encounter (Signed)
Please advise 

## 2016-06-25 NOTE — Telephone Encounter (Signed)
Did the cortisone injection help?   We can get an MRI of the knee if needed

## 2016-06-25 NOTE — Telephone Encounter (Signed)
Pt is aware waiting on NP

## 2016-06-26 ENCOUNTER — Other Ambulatory Visit: Payer: Self-pay | Admitting: Adult Health

## 2016-06-26 DIAGNOSIS — M25562 Pain in left knee: Secondary | ICD-10-CM

## 2016-06-26 NOTE — Telephone Encounter (Signed)
Patient states that he feels like the Cortisone injection helped some, but not as much as he hoped.  He states that he would like an MRI.

## 2016-07-11 ENCOUNTER — Ambulatory Visit (INDEPENDENT_AMBULATORY_CARE_PROVIDER_SITE_OTHER): Payer: 59 | Admitting: Adult Health

## 2016-07-11 VITALS — BP 140/82 | Temp 98.2°F | Ht 74.0 in | Wt 244.6 lb

## 2016-07-11 DIAGNOSIS — J0111 Acute recurrent frontal sinusitis: Secondary | ICD-10-CM

## 2016-07-11 MED ORDER — DOXYCYCLINE HYCLATE 100 MG PO CAPS: 100.0000 mg | ORAL_CAPSULE | Freq: Two times a day (BID) | ORAL | 0 refills | Status: DC

## 2016-07-11 NOTE — Progress Notes (Signed)
Subjective:    Patient ID: Gerald Johnson, male    DOB: [birth year], 56 y.o.   MRN: [id number]  Sinusitis  This is a recurrent problem. The current episode started in the past 7 days (3 days ). There has been no fever. Associated symptoms include congestion, headaches and sinus pressure. Pertinent negatives include no ear pain. Treatments tried: excedrin  The treatment provided no relief.    Review of Systems  Constitutional: Negative.   HENT: Positive for congestion, postnasal drip, rhinorrhea, sinus pain and sinus pressure. Negative for ear pain and trouble swallowing.   Neurological: Positive for headaches. Negative for dizziness and weakness.  All other systems reviewed and are negative.  Past Medical History:  Diagnosis Date  . Acute prostatitis 12/02/2007  . ALLERGIC RHINITIS 02/26/2007  . Asthma   . 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 Hemorrhoids   . HYPERLIPIDEMIA 02/26/2007  . Migraines   . Sciatica 05/04/2008  . SINUSITIS, RECURRENT 02/28/2010    Social History   Social History  . Marital status: Single    Spouse name: N/A  . Number of children: N/A  . Years of education: N/A   Occupational History  . Not on file.   Social History Main Topics  . Smoking status: Never Smoker  . Smokeless tobacco: Never Used  . Alcohol use 0.0 oz/week     Comment: occ  . Drug use: No  . Sexual activity: Yes    Birth control/ protection: Condom   Other Topics Concern  . Not on file   Social History Narrative   Works as a Nature conservation officer.    Not married    No kids    He likes to go to the gym. Likes to go to go to restaurants.     Past Surgical History:  Procedure Laterality Date  . WISDOM TOOTH EXTRACTION      Family History  Problem Relation Age of Onset  . Diabetes Brother   . Colon cancer Neg Hx   . Esophageal cancer Neg Hx   . Rectal cancer Neg Hx   . Stomach cancer Neg Hx     No Known Allergies  Current Outpatient Prescriptions on File  Prior to Visit  Medication Sig Dispense Refill  . amitriptyline (ELAVIL) 25 MG tablet Take 1 tablet (25 mg total) by mouth at bedtime. 30 tablet 3  . Aspirin-Acetaminophen-Caffeine (EXCEDRIN PO) Take by mouth as needed.    Marland Kitchen HYDROcodone-acetaminophen (NORCO) 10-325 MG per tablet Take 1 tablet by mouth every 6 (six) hours as needed for severe pain. 30 tablet 0  . indomethacin (INDOCIN) 50 MG capsule Take 1 capsule (50 mg total) by mouth 2 (two) times daily with a meal. 60 capsule 3  . SUMAtriptan (IMITREX) 50 MG tablet TAKE 1 TABLET BY MOUTH EVERY 2 HOURS AS NEEDED FOR MIGRAINE. MAY REPEAT IN 2 HOURS IF HEADACHE PERSISTS OR RECURS 10 tablet 3   No current facility-administered medications on file prior to visit.     BP 140/82   Temp 98.2 F (36.8 C) (Oral)   Ht 6\' 2"  (1.88 m)   Wt 244 lb 9.6 oz (110.9 kg)   BMI 31.40 kg/m       Objective:   Physical Exam  Constitutional: He is oriented to person, place, and time. He appears well-developed and well-nourished. No distress.  HENT:  Head: Normocephalic and atraumatic.  Right Ear: Hearing, tympanic membrane, external ear and ear canal normal.  Left Ear: Hearing, tympanic  membrane, external ear and ear canal normal.  Nose: Mucosal edema and rhinorrhea present. Right sinus exhibits frontal sinus tenderness. Right sinus exhibits no maxillary sinus tenderness. Left sinus exhibits frontal sinus tenderness. Left sinus exhibits no maxillary sinus tenderness.  Mouth/Throat: Uvula is midline, oropharynx is clear and moist and mucous membranes are normal. No oropharyngeal exudate.  Eyes: Conjunctivae and EOM are normal. Pupils are equal, round, and reactive to light. Right eye exhibits no discharge. Left eye exhibits no discharge. No scleral icterus.  Neck: Normal range of motion. Neck supple. No thyromegaly present.  Cardiovascular: Normal rate, regular rhythm, normal heart sounds and intact distal pulses.  Exam reveals no gallop and no friction  rub.   No murmur heard. Pulmonary/Chest: Effort normal and breath sounds normal. No respiratory distress. He has no wheezes. He has no rales. He exhibits no tenderness.  Lymphadenopathy:    He has no cervical adenopathy.  Neurological: He is alert and oriented to person, place, and time.  Skin: Skin is warm and dry. No rash noted. He is not diaphoretic. No erythema. No pallor.  Psychiatric: He has a normal mood and affect. His behavior is normal. Judgment and thought content normal.  Nursing note and vitals reviewed.      Assessment & Plan:  1. Acute recurrent frontal sinusitis - Advised to use Flonase for the next 3-5 days. If no improvement then start antibiotics.  - doxycycline (VIBRAMYCIN) 100 MG capsule; Take 1 capsule (100 mg total) by mouth 2 (two) times daily.  Dispense: 14 capsule; Refill: 0 - Consider referral to ENT  - Follow up if no resolution   [name], NP

## 2016-07-19 ENCOUNTER — Other Ambulatory Visit: Payer: 59

## 2016-12-01 ENCOUNTER — Other Ambulatory Visit: Payer: Self-pay | Admitting: Adult Health

## 2016-12-01 DIAGNOSIS — R51 Headache: Principal | ICD-10-CM

## 2016-12-01 DIAGNOSIS — R519 Headache, unspecified: Secondary | ICD-10-CM

## 2016-12-02 ENCOUNTER — Encounter: Payer: Self-pay | Admitting: Family Medicine

## 2016-12-02 ENCOUNTER — Ambulatory Visit (INDEPENDENT_AMBULATORY_CARE_PROVIDER_SITE_OTHER): Payer: 59 | Admitting: Family Medicine

## 2016-12-02 VITALS — BP 140/90 | HR 78 | Temp 98.1°F | Wt 246.0 lb

## 2016-12-02 DIAGNOSIS — R030 Elevated blood-pressure reading, without diagnosis of hypertension: Secondary | ICD-10-CM | POA: Diagnosis not present

## 2016-12-02 DIAGNOSIS — J0110 Acute frontal sinusitis, unspecified: Secondary | ICD-10-CM | POA: Diagnosis not present

## 2016-12-02 MED ORDER — AMOXICILLIN-POT CLAVULANATE 875-125 MG PO TABS: 1.0000 | ORAL_TABLET | Freq: Two times a day (BID) | ORAL | 0 refills | Status: DC

## 2016-12-02 MED ORDER — FLUTICASONE PROPIONATE 50 MCG/ACT NA SUSP: 2.0000 | Freq: Every day | NASAL | 6 refills | Status: DC

## 2016-12-02 NOTE — Patient Instructions (Signed)
Please use allegra with flonase for allergy symptoms. Please drink plenty of water enough for your urine to be pale yellow or clear. You may use Tylenol 325 mg every 6 hours as needed for pain. Follow-up for evaluation if your symptoms do not improve in 3-4 days, worsen, or you develop a fever greater than 100.   Sinusitis, Adult Sinusitis is soreness and inflammation of your sinuses. Sinuses are hollow spaces in the bones around your face. They are located:  Around your eyes.  In the middle of your forehead.  Behind your nose.  In your cheekbones.  Your sinuses and nasal passages are lined with a stringy fluid (mucus). Mucus normally drains out of your sinuses. When your nasal tissues get inflamed or swollen, the mucus can get trapped or blocked so air cannot flow through your sinuses. This lets bacteria, viruses, and funguses grow, and that leads to infection. Follow these instructions at home: Medicines  Take, use, or apply over-the-counter and prescription medicines only as told by your doctor. These may include nasal sprays.  If you were prescribed an antibiotic medicine, take it as told by your doctor. Do not stop taking the antibiotic even if you start to feel better. Hydrate and Humidify  Drink enough water to keep your pee (urine) clear or pale yellow.  Use a cool mist humidifier to keep the humidity level in your home above 50%.  Breathe in steam for 10-15 minutes, 3-4 times a day or as told by your doctor. You can do this in the bathroom while a hot shower is running.  Try not to spend time in cool or dry air. Rest  Rest as much as possible.  Sleep with your head raised (elevated).  Make sure to get enough sleep each night. General instructions  Put a warm, moist washcloth on your face 3-4 times a day or as told by your doctor. This will help with discomfort.  Wash your hands often with soap and water. If there is no soap and water, use hand sanitizer.  Do not  smoke. Avoid being around people who are smoking (secondhand smoke).  Keep all follow-up visits as told by your doctor. This is important. Contact a doctor if:  You have a fever.  Your symptoms get worse.  Your symptoms do not get better within 10 days. Get help right away if:  You have a very bad headache.  You cannot stop throwing up (vomiting).  You have pain or swelling around your face or eyes.  You have trouble seeing.  You feel confused.  Your neck is stiff.  You have trouble breathing. This information is not intended to replace advice given to you by your health care provider. Make sure you discuss any questions you have with your health care provider. Document Released: 10/30/2007 Document Revised: 01/07/2016 Document Reviewed: 03/08/2015 Elsevier Interactive Patient Education  [employer].

## 2016-12-02 NOTE — Progress Notes (Signed)
Patient ID: Gerald Johnson, male   DOB: [birth year], 56 y.o.   MRN: [id number]  PCP: [name], NP  Subjective:  Gerald Johnson is a 56 y.o. year old very pleasant male patient who presents with Upper Respiratory infection symptoms including nasal congestion, sinus pressure/pain, post nasal drip, and rhinitis  -started: 4 days ago, symptoms are worsening -previous treatments: Excedrin and sumatriptan for pain have provided moderate benefit. Flonase has not been used consistently -sick contacts/travel/risks: denies flu exposure.  No recent sick contact exposure  -Hx of: allergies  Elevated blood pressure reading noted; he does not have a diagnosis of HTN; he denies chest pain, palpitations, SOB, numbness, tingling, weakness, headaches, or edema.   ROS-denies fever, SOB, NVD,sore throat, cough,  tooth pain  Pertinent Past Medical History-  Allergic rhinitis, Hyperlipidemia  Medications- reviewed  Current Outpatient Prescriptions  Medication Sig Dispense Refill  . amitriptyline (ELAVIL) 25 MG tablet Take 1 tablet (25 mg total) by mouth at bedtime. 30 tablet 3  . Aspirin-Acetaminophen-Caffeine (EXCEDRIN PO) Take by mouth as needed.    . indomethacin (INDOCIN) 50 MG capsule Take 1 capsule (50 mg total) by mouth 2 (two) times daily with a meal. 60 capsule 3  . SUMAtriptan (IMITREX) 50 MG tablet TAKE 1 TABLET BY MOUTH EVERY 2 HOURS AS NEEDED FOR MIGRAINE. MAY REPEAT IN 2 HOURS IF HEADACHE PERSISTS OR RECURS 10 tablet 3  . HYDROcodone-acetaminophen (NORCO) 10-325 MG per tablet Take 1 tablet by mouth every 6 (six) hours as needed for severe pain. (Patient not taking: Reported on 12/02/2016) 30 tablet 0   No current facility-administered medications for this visit.     Objective: BP (!) 142/96 (BP Location: Left Arm, Patient Position: Sitting, Cuff Size: Normal)   Pulse 78   Temp 98.1 F (36.7 C) (Oral)   Wt 246 lb (111.6 kg)   SpO2 97%   BMI 31.58 kg/m  Gen: NAD, resting  comfortably HEENT: Turbinates erythematous, TM normal, pharynx mildly erythematous with no tonsilar exudate or edema, + frontal sinus tenderness CV: RRR no murmurs rubs or gallops Lungs: CTAB no crackles, wheeze, rhonchi Abdomen: soft/nontender/nondistended/normal bowel sounds. No rebound or guarding.  Ext: no edema Skin: warm, dry, no rash Neuro: grossly normal, moves all extremities  Assessment/Plan: 1. Acute frontal sinusitis, recurrence not specified Exam and history support treatment for sinusitis. Advised patient on supportive measures:  Get rest, drink plenty of fluids, and use tylenol  as needed for pain. Follow up if fever >101, if symptoms worsen or if symptoms are not improved in 3 to 4 days. Patient verbalizes understanding.  Also advised aggressive treatment for symptoms of allergies by using Allegra and flonase daily.  - fluticasone (FLONASE) 50 MCG/ACT nasal spray; Place 2 sprays into both nostrils daily.  Dispense: 16 g; Refill: 6 - amoxicillin-clavulanate (AUGMENTIN) 875-125 MG tablet; Take 1 tablet by mouth 2 (two) times daily.  Dispense: 20 tablet; Refill: 0   2. Elevated blood pressure reading without diagnosis of hypertension Retake of BP: 140/90. He is due for physical and follow up with PCP. He will schedule this and follow up for further evaluation. Advised monitoring of blood pressure and avoidance of salt in diet.   Follow up in the next month for further evaluation and treatment and routine care.   Finally, we reviewed reasons to return to care including if symptoms worsen or persist or new concerns arise- once again particularly shortness of breath or fever.  Advised follow up with PCP for evaluation  of symptoms if no improvement as this has been a problem previously. PCP has suggested an ENT referral can be considered and we discussed this today. He agreed to follow up with PCP.   Laurita Quint, FNP

## 2016-12-05 NOTE — Telephone Encounter (Signed)
Ok to refill plus 6

## 2016-12-05 NOTE — Telephone Encounter (Signed)
Pt need new Rx for sumatriptan  Pharm:  [facility].

## 2017-02-03 ENCOUNTER — Encounter: Payer: Self-pay | Admitting: Family Medicine

## 2017-02-03 ENCOUNTER — Ambulatory Visit (INDEPENDENT_AMBULATORY_CARE_PROVIDER_SITE_OTHER): Payer: 59 | Admitting: Family Medicine

## 2017-02-03 VITALS — BP 110/80 | HR 100 | Temp 98.5°F | Wt 248.7 lb

## 2017-02-03 DIAGNOSIS — M25462 Effusion, left knee: Secondary | ICD-10-CM | POA: Diagnosis not present

## 2017-02-03 MED ORDER — PREDNISONE 20 MG PO TABS: ORAL_TABLET | ORAL | 0 refills | Status: DC

## 2017-02-03 NOTE — Progress Notes (Signed)
Subjective:     Patient ID: Gerald Johnson, male   DOB: 20-Mar-1961, 56 y.o.   MRN: [id number]  HPI Patient seen with acute left knee pain. Started Saturday. No injury. He's had similar flare up of his knee in the past which improved with steroid injection and also presumed acute gout of the right foot MTP joint couple years ago. No family history of gout.  He noticed some swelling on Saturday along with mild warmth. No fevers or chills. Has not taken anything for this. Pain has been progressive since then. 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  Past Medical History:  Diagnosis Date  . Acute prostatitis 12/02/2007  . ALLERGIC RHINITIS 02/26/2007  . Asthma   . 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 Hemorrhoids   . HYPERLIPIDEMIA 02/26/2007  . Migraines   . Sciatica 05/04/2008  . SINUSITIS, RECURRENT 02/28/2010   Past Surgical History:  Procedure Laterality Date  . WISDOM TOOTH EXTRACTION      reports that he has never smoked. He has never used smokeless tobacco. He reports that he drinks alcohol. He reports that he does not use drugs. family history includes Diabetes in his brother. No Known Allergies   Review of Systems  Constitutional: Negative for chills and fever.       Objective:   Physical Exam  Constitutional: He appears well-developed and well-nourished.  Cardiovascular: Normal rate and regular rhythm.   Pulmonary/Chest: Effort normal and breath sounds normal. No respiratory distress. He has no wheezes. He has no rales.  Musculoskeletal:  Left knee reveals some mild warmth compared to the right. He has moderate effusion. Full range of motion. No localizing bony tenderness. Ligament testing is normal       Assessment:     Acute left knee effusion. Question acute gout. Very unlikely related to degenerative arthritis. No indicators of infection. No recent trauma    Plan:     -We discussed options of  drawing off fluid and possible intra-articular cortisone injection versus oral prednisone and have elected on the latter. -We recommended follow-up with primary in one week to reassess and sooner as needed -Follow-up immediately for any fevers, chills, or other concerns -Work note written for today and tomorrow -Consider baseline uric acid and would also consider arthrocentesis of the knee joint and sending for crystal analysis if not dramatically improved over the next few days  Eulas Post MD [facility]

## 2017-02-13 ENCOUNTER — Encounter: Payer: Self-pay | Admitting: Family Medicine

## 2017-02-13 ENCOUNTER — Ambulatory Visit (INDEPENDENT_AMBULATORY_CARE_PROVIDER_SITE_OTHER): Payer: 59 | Admitting: Family Medicine

## 2017-02-13 VITALS — BP 152/98 | HR 80 | Temp 98.3°F | Ht 74.0 in | Wt 246.0 lb

## 2017-02-13 DIAGNOSIS — G43911 Migraine, unspecified, intractable, with status migrainosus: Secondary | ICD-10-CM | POA: Diagnosis not present

## 2017-02-13 MED ORDER — KETOROLAC TROMETHAMINE 60 MG/2ML IM SOLN
60.0000 mg | Freq: Once | INTRAMUSCULAR | Status: AC
  Administered 2017-02-13: 60 mg via INTRAMUSCULAR

## 2017-02-13 NOTE — Patient Instructions (Signed)
Toradol 60mg  injection today  If headache does not resolve- consider return visit to [facility]- may be worth considering another injection and using a course of prednisone.   New or worsening symptoms despite treatment please let us know

## 2017-02-13 NOTE — Progress Notes (Signed)
Subjective:  Gerald Johnson is a 56 y.o. year old very pleasant male patient who presents for/with See problem oriented charting ROS- No facial or extremity weakness. No slurred words or trouble swallowing. no blurry vision or double vision. No paresthesias. No confusion or word finding difficulties.    Past Medical History-  Patient Active Problem List   Diagnosis Date Noted  . Gout 01/27/2015  . Headache behind the eyes 07/03/2010  . 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 Sciatica 05/04/2008  . Acute prostatitis 12/02/2007  . HYPERLIPIDEMIA 02/26/2007  . SINUSITIS, ACUTE NOS 02/26/2007  . ALLERGIC RHINITIS 02/26/2007    Medications- reviewed and updated Current Outpatient Prescriptions  Medication Sig Dispense Refill  . Aspirin-Acetaminophen-Caffeine (EXCEDRIN PO) Take by mouth as needed.    . fluticasone (FLONASE) 50 MCG/ACT nasal spray Place 2 sprays into both nostrils daily. 16 g 6  . indomethacin (INDOCIN) 50 MG capsule Take 1 capsule (50 mg total) by mouth 2 (two) times daily with a meal. 60 capsule 3  . SUMAtriptan (IMITREX) 50 MG tablet TAKE 1 TABLET BY MOUTH EVERY 2 HOURS AS NEEDED FOR MIGRAINE.MAY REPEAT IN 2 HOURS IF HEADACHE PERSISTS OR RECURS. 10 tablet 6   No current facility-administered medications for this visit.     Objective: BP (!) 152/98 (BP Location: Left Arm, Patient Position: Sitting, Cuff Size: Normal)   Pulse 80   Temp 98.3 F (36.8 C) (Oral)   Ht 6\' 2"  (1.88 m)   Wt 246 lb (111.6 kg)   SpO2 98%   BMI 31.58 kg/m  Gen: NAD, resting comfortably CV: RRR no murmurs rubs or gallops Lungs: CTAB no crackles, wheeze, rhonchi Ext: no edema Skin: warm, dry Neuro: CN II-XII intact, sensation and reflexes normal throughout, 5/5 muscle strength in bilateral upper and lower extremities. Normal finger to nose. Normal rapid alternating movements. No pronator drift. Normal romberg. Normal gait.   Assessment/Plan:  Intractable migraine with  status migrainosus, unspecified migraine type - Plan: ketorolac (TORADOL) injection 60 mg S: patient with history of migraines for 15 years. Has had headache for 2 days. 9/10 pain located right behind eyes- typical for headache pattern except usually on one side. Tried imitrex and excedrin without relief. Denies vision changes. Off prednisone for 3 days- gout issues have resolved. Having some mild nausea. Excedrin about an hour ago. No light sensitivity A/P: Prolonged migraine for patient which is not typical- not responsive to typical agents. Will give toradol injection and advise PCP follow up if not improved  DId note BP elevated- suspect toradol injection will actually help BP as suspect it is reslated to his migraines- has had some very mild intermittent elevations in the past in BP but does not carry hypertension diagnosis. Additionally- do not think BP is cause of headache but instead patient reaction to the headache.  Patient Instructions  Toradol 60mg  injection today  If headache does not resolve- consider return visit to [facility]- may be worth considering another injection and using a course of prednisone.   New or worsening symptoms despite treatment please let us know  Meds ordered this encounter  Medications  . ketorolac (TORADOL) injection 60 mg   Return precautions advised.  Garret Reddish, MD

## 2017-03-14 ENCOUNTER — Encounter: Payer: Self-pay | Admitting: Adult Health

## 2017-03-14 ENCOUNTER — Ambulatory Visit (INDEPENDENT_AMBULATORY_CARE_PROVIDER_SITE_OTHER): Payer: 59 | Admitting: Adult Health

## 2017-03-14 VITALS — BP 138/100 | Temp 98.2°F | Wt 246.0 lb

## 2017-03-14 DIAGNOSIS — G43011 Migraine without aura, intractable, with status migrainosus: Secondary | ICD-10-CM | POA: Diagnosis not present

## 2017-03-14 DIAGNOSIS — Z23 Encounter for immunization: Secondary | ICD-10-CM | POA: Diagnosis not present

## 2017-03-14 MED ORDER — METHYLPREDNISOLONE 4 MG PO TBPK: ORAL_TABLET | ORAL | 0 refills | Status: DC

## 2017-03-14 MED ORDER — KETOROLAC TROMETHAMINE 60 MG/2ML IM SOLN
60.0000 mg | Freq: Once | INTRAMUSCULAR | Status: AC
  Administered 2017-03-14: 60 mg via INTRAMUSCULAR

## 2017-03-14 NOTE — Progress Notes (Signed)
Subjective:    Patient ID: Gerald Johnson, male    DOB: 11-17-1960, 56 y.o.   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 He presents to the office today with the acute on chronic complaint of migraines. He has a history of migraines for 15 years. He has had this headache for two days. Pain is located behind right eye - which is typical for for him. Pain radiated at 8/10.  He has used Imitrex and Excedrin without relief. He was seen by Dr. Yong Channel one month ago and received a toradol injection, which worked well for him. He denies any photo or phono sensitivity    Review of Systems See HPI   Past Medical History:  Diagnosis Date  . Acute prostatitis 12/02/2007  . ALLERGIC RHINITIS 02/26/2007  . Asthma   . 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 Hemorrhoids   . HYPERLIPIDEMIA 02/26/2007  . Migraines   . Sciatica 05/04/2008  . SINUSITIS, RECURRENT 02/28/2010    Social History   Social History  . Marital status: Single    Spouse name: N/A  . Number of children: N/A  . Years of education: N/A   Occupational History  . Not on file.   Social History Main Topics  . Smoking status: Never Smoker  . Smokeless tobacco: Never Used  . Alcohol use 0.0 oz/week     Comment: occ  . Drug use: No  . Sexual activity: Yes    Birth control/ protection: Condom   Other Topics Concern  . Not on file   Social History Narrative   Works as a Nature conservation officer.    Not married    No kids    He likes to go to the gym. Likes to go to go to restaurants.     Past Surgical History:  Procedure Laterality Date  . WISDOM TOOTH EXTRACTION      Family History  Problem Relation Age of Onset  . Diabetes Brother   . Colon cancer  Neg Hx   . Esophageal cancer Neg Hx   . Rectal cancer Neg Hx   . Stomach cancer Neg Hx     No Known Allergies  Current Outpatient Prescriptions on File Prior to Visit  Medication Sig Dispense Refill  . Aspirin-Acetaminophen-Caffeine (EXCEDRIN PO) Take by mouth as needed.    . fluticasone (FLONASE) 50 MCG/ACT nasal spray Place 2 sprays into both nostrils daily. 16 g 6  . SUMAtriptan (IMITREX) 50 MG tablet TAKE 1 TABLET BY MOUTH EVERY 2 HOURS AS NEEDED FOR MIGRAINE.MAY REPEAT IN 2 HOURS IF HEADACHE PERSISTS OR RECURS. 10 tablet 6  . indomethacin (INDOCIN) 50 MG capsule Take 1 capsule (50 mg total) by mouth 2 (two) times daily with a meal. (Patient not taking: Reported on 03/14/2017) 60 capsule 3   No current facility-administered medications on file prior to visit.     BP (!) 138/100 (BP Location: Left Arm)   Temp 98.2 F (36.8 C) (Oral)   Wt 246 lb (111.6 kg)   BMI 31.58 kg/m       Objective:   Physical Exam  Constitutional: He is oriented to person, place, and time. He appears well-developed and well-nourished. No distress.  Eyes: Pupils are equal, round, and reactive to light. Conjunctivae and EOM are normal. Right eye exhibits  discharge (watery ). Left eye exhibits discharge (watery ).  Neck: Normal range of motion. Neck supple.  Cardiovascular: Normal rate, regular rhythm, normal heart sounds and intact distal pulses.   No murmur heard. Pulmonary/Chest: Effort normal and breath sounds normal. No respiratory distress. He has no wheezes. He has no rales. He exhibits no tenderness.  Lymphadenopathy:    He has no cervical adenopathy.  Neurological: He is oriented to person, place, and time.  Skin: Skin is warm and dry. No rash noted. He is not diaphoretic. No erythema. No pallor.  Psychiatric: He has a normal mood and affect. His behavior is normal. Judgment and thought content normal.  Nursing note and vitals reviewed.     Assessment & Plan:  1. Intractable migraine  without aura and with status migrainosus - Need to consider migraine prophylaxis if symptoms continue  - ketorolac (TORADOL) injection 60 mg; Inject 2 mLs (60 mg total) into the muscle once. - methylPREDNISolone (MEDROL DOSEPAK) 4 MG TBPK tablet; Take as directed  Dispense: 21 tablet; Refill: 0  2. Need for influenza vaccination  - Flu Vaccine QUAD 6+ mos PF IM (Fluarix Quad PF)  [name], NP

## 2017-06-03 ENCOUNTER — Ambulatory Visit: Payer: Self-pay

## 2017-06-03 NOTE — Telephone Encounter (Signed)
Reason for Disposition . Cough  Answer Assessment - Initial Assessment Questions 1. RESPIRATORY STATUS: "Describe your breathing?" (e.g., wheezing, shortness of breath, unable to speak, severe coughing)      Short of breath 2. ONSET: "When did this breathing problem begin?"      yesterday 3. PATTERN "Does the difficult breathing come and go, or has it been constant since it started?"      constant 4. SEVERITY: "How bad is your breathing?" (e.g., mild, moderate, severe)    - MILD: No SOB at rest, mild SOB with walking, speaks normally in sentences, can lay down, no retractions, pulse < 100.    - MODERATE: SOB at rest, SOB with minimal exertion and prefers to sit, cannot lie down flat, speaks in phrases, mild retractions, audible wheezing, pulse 100-120.    - SEVERE: Very SOB at rest, speaks in single words, struggling to breathe, sitting hunched forward, retractions, pulse > 120     Moderate no audible wheezing 5. RECURRENT SYMPTOM: "Have you had difficulty breathing before?" If so, ask: "When was the last time?" and "What happened that time?"      Yes couple years ago pain in the side went away on its own 6. CARDIAC HISTORY: "Do you have any history of heart disease?" (e.g., heart attack, angina, bypass surgery, angioplasty)      no 7. LUNG HISTORY: "Do you have any history of lung disease?"  (e.g., pulmonary embolus, asthma, emphysema)     Asthma growing has allergies now 8. CAUSE: "What do you think is causing the breathing problem?"      Mucous buildup 9. OTHER SYMPTOMS: "Do you have any other symptoms? (e.g., dizziness, runny nose, cough, chest pain, fever)     Cough no dizziness, no runny nose, no chest pain, no fever. 10. PREGNANCY: "Is there any chance you are pregnant?" "When was your last menstrual period?"       n/a 11. TRAVEL: "Have you traveled out of the country in the last month?" (e.g., travel history, exposures)       no  Answer Assessment - Initial Assessment  Questions 1. ONSET: "When did the cough begin?"      yest 2. SEVERITY: "How bad is the cough today?"      More frequent cough today  3. RESPIRATORY DISTRESS: "Describe your breathing."      SOB per pt at rest 4. FEVER: "Do you have a fever?" If so, ask: "What is your temperature, how was it measured, and when did it start?"     no 5. HEMOPTYSIS: "Are you coughing up any blood?" If so ask: "How much?" (flecks, streaks, tablespoons, etc.)     no 6. TREATMENT: "What have you done so far to treat the cough?" (e.g., meds, fluids, humidifier)     no 7. CARDIAC HISTORY: "Do you have any history of heart disease?" (e.g., heart attack, congestive heart failure)      no 8. LUNG HISTORY: "Do you have any history of lung disease?"  (e.g., pulmonary embolus, asthma, emphysema)     Asthma as a child No pulmonary embolisms or emphysema 9. PE RISK FACTORS: "Do you have a history of blood clots?" (or: recent major surgery, recent prolonged travel, bedridden )     no 10. OTHER SYMPTOMS: "Do you have any other symptoms? (e.g., runny nose, wheezing, chest pain)       No runny nose, post nasal drip, upper right side below arm pit 11. PREGNANCY: "Is there any chance you  are pregnant?" "When was your last menstrual period?"       n/a 12. TRAVEL: "Have you traveled out of the country in the last month?" (e.g., travel history, exposures)       no  Protocols used: COUGH - ACUTE NON-PRODUCTIVE-A-AH, BREATHING DIFFICULTY-A-AH

## 2017-06-03 NOTE — Telephone Encounter (Signed)
Pt calling with 2 day h/o cough "couging a little", mild SOB at rest. Pt denies fever, wheezing, runny nose. + post nasal drip. Pt states he is having pain to his upper side of his chest near axilla with coughing. Pt able to speak in sentences. Denies SOB with exertion. HR 72. No protocol that fits symptoms. Pt given appointment with Dr Volanda Napoleon 4 pm tomorrow.

## 2017-06-04 ENCOUNTER — Encounter: Payer: Self-pay | Admitting: Family Medicine

## 2017-06-04 ENCOUNTER — Ambulatory Visit: Payer: 59 | Admitting: Family Medicine

## 2017-06-04 VITALS — BP 130/100 | HR 79 | Temp 98.6°F | Wt 249.8 lb

## 2017-06-04 DIAGNOSIS — R030 Elevated blood-pressure reading, without diagnosis of hypertension: Secondary | ICD-10-CM

## 2017-06-04 DIAGNOSIS — R0982 Postnasal drip: Secondary | ICD-10-CM | POA: Diagnosis not present

## 2017-06-04 MED ORDER — FLUTICASONE PROPIONATE 50 MCG/ACT NA SUSP: 1.0000 | Freq: Every day | NASAL | 6 refills | Status: DC

## 2017-06-04 NOTE — Patient Instructions (Addendum)
Postnasal Drip Postnasal drip is the feeling of mucus going down the back of your throat. Mucus is a slimy substance that moistens and cleans your nose and throat, as well as the air pockets in face bones near your forehead and cheeks (sinuses). Small amounts of mucus pass from your nose and sinuses down the back of your throat all the time. This is normal. When you produce too much mucus or the mucus gets too thick, you can feel it. Some common causes of postnasal drip include:  Having more mucus because of: ? A cold or the flu. ? Allergies. ? Cold air. ? Certain medicines.  Having more mucus that is thicker because of: ? A sinus or nasal infection. ? Dry air. ? A food allergy.  Follow these instructions at home: Relieving discomfort  Gargle with a salt-water mixture 3-4 times a day or as needed. To make a salt-water mixture, completely dissolve -1 tsp of salt in 1 cup of warm water.  If the air in your home is dry, use a humidifier to add moisture to the air.  Use a saline spray or container (neti pot) to flush out the nose (nasal irrigation). These methods can help clear away mucus and keep the nasal passages moist. General instructions  Take over-the-counter and prescription medicines only as told by your health care provider.  Follow instructions from your health care provider about eating or drinking restrictions. You may need to avoid caffeine.  Avoid things that you know you are allergic to (allergens), like dust, mold, pollen, pets, or certain foods.  Drink enough fluid to keep your urine pale yellow.  Keep all follow-up visits as told by your health care provider. This is important. Contact a health care provider if:  You have a fever.  You have a sore throat.  You have difficulty swallowing.  You have headache.  You have sinus pain.  You have a cough that does not go away.  The mucus from your nose becomes thick and is green or yellow in color.  You have  cold or flu symptoms that last more than 10 days. Summary  Postnasal drip is the feeling of mucus going down the back of your throat.  If your health care provider approves, use nasal irrigation or a nasal spray 2?4 times a day.  Avoid things that you know you are allergic to (allergens), like dust, mold, pollen, pets, or certain foods. This information is not intended to replace advice given to you by your health care provider. Make sure you discuss any questions you have with your health care provider. Document Released: 08/26/2016 Document Revised: 08/26/2016 Document Reviewed: 08/26/2016 Elsevier Interactive Patient Education  2018 [employer].  Preventing Hypertension Hypertension, commonly called high blood pressure, is when the force of blood pumping through the arteries is too strong. Arteries are blood vessels that carry blood from the heart throughout the body. Over time, hypertension can damage the arteries and decrease blood flow to important parts of the body, including the brain, heart, and kidneys. Often, hypertension does not cause symptoms until blood pressure is very high. For this reason, it is important to have your blood pressure checked on a regular basis. Hypertension can often be prevented with diet and lifestyle changes. If you already have hypertension, you can control it with diet and lifestyle changes, as well as medicine. What nutrition changes can be made? Maintain a healthy diet. This includes:  Eating less salt (sodium). Ask your health care provider  how much sodium is safe for you to have. The general recommendation is to consume less than 1 tsp (2,300 mg) of sodium a day. ? Do not add salt to your food. ? Choose low-sodium options when grocery shopping and eating out.  Limiting fats in your diet. You can do this by eating low-fat or fat-free dairy products and by eating less red meat.  Eating more fruits, vegetables, and whole grains. Make a goal to  eat: ? 1-2 cups of fresh fruits and vegetables each day. ? 3-4 servings of whole grains each day.  Avoiding foods and beverages that have added sugars.  Eating fish that contain healthy fats (omega-3 fatty acids), such as mackerel or salmon.  If you need help putting together a healthy eating plan, try the DASH diet. This diet is high in fruits, vegetables, and whole grains. It is low in sodium, red meat, and added sugars. DASH stands for Dietary Approaches to Stop Hypertension. What lifestyle changes can be made?  Lose weight if you are overweight. Losing just 3?5% of your body weight can help prevent or control hypertension. ? For example, if your present weight is 200 lb (91 kg), a loss of 3-5% of your weight means losing 6-10 lb (2.7-4.5 kg). ? Ask your health care provider to help you with a diet and exercise plan to safely lose weight.  Get enough exercise. Do at least 150 minutes of moderate-intensity exercise each week. ? You could do this in short exercise sessions several times a day, or you could do longer exercise sessions a few times a week. For example, you could take a brisk 10-minute walk or bike ride, 3 times a day, for 5 days a week.  Find ways to reduce stress, such as exercising, meditating, listening to music, or taking a yoga class. If you need help reducing stress, ask your health care provider.  Do not smoke. This includes e-cigarettes. Chemicals in tobacco and nicotine products raise your blood pressure each time you smoke. If you need help quitting, ask your health care provider.  Avoid alcohol. If you drink alcohol, limit alcohol intake to no more than 1 drink a day for nonpregnant women and 2 drinks a day for men. One drink equals 12 oz of beer, 5 oz of wine, or 1 oz of hard liquor. Why are these changes important? Diet and lifestyle changes can help you prevent hypertension, and they may make you feel better overall and improve your quality of life. If you have  hypertension, making these changes will help you control it and help prevent major complications, such as:  Hardening and narrowing of arteries that supply blood to: ? Your heart. This can cause a heart attack. ? Your brain. This can cause a stroke. ? Your kidneys. This can cause kidney failure.  Stress on your heart muscle, which can cause heart failure.  What can I do to lower my risk?  Work with your health care provider to make a hypertension prevention plan that works for you. Follow your plan and keep all follow-up visits as told by your health care provider.  Learn how to check your blood pressure at home. Make sure that you know your personal target blood pressure, as told by your health care provider. How is this treated? In addition to diet and lifestyle changes, your health care provider may recommend medicines to help lower your blood pressure. You may need to try a few different medicines to find what works best  for you. You also may need to take more than one medicine. Take over-the-counter and prescription medicines only as told by your health care provider. Where to find support: Your health care provider can help you prevent hypertension and help you keep your blood pressure at a healthy level. Your local hospital or your community may also provide support services and prevention programs. The American Heart Association offers an online support network at: CheapBootlegs.com.cy Where to find more information: Learn more about hypertension from:  National Heart, Lung, and Blood Institute: ElectronicHangman.is  Centers for Disease Control and Prevention: https://ingram.com/  American Academy of Family Physicians: http://familydoctor.org/familydoctor/en/diseases-conditions/high-blood-pressure.printerview.all.html  Learn more about the DASH diet from:  [place], Lung, and [place]:  https://www.reyes.com/  Contact a health care provider if:  You think you are having a reaction to medicines you have taken.  You have recurrent headaches or feel dizzy.  You have swelling in your ankles.  You have trouble with your vision. Summary  Hypertension often does not cause any symptoms until blood pressure is very high. It is important to get your blood pressure checked regularly.  Diet and lifestyle changes are the most important steps in preventing hypertension.  By keeping your blood pressure in a healthy range, you can prevent complications like heart attack, heart failure, stroke, and kidney failure.  Work with your health care provider to make a hypertension prevention plan that works for you. This information is not intended to replace advice given to you by your health care provider. Make sure you discuss any questions you have with your health care provider. Document Released: 05/28/2015 Document Revised: 01/22/2016 Document Reviewed: 01/22/2016 Elsevier Interactive Patient Education  2018 [place] Eating Plan DASH stands for "Dietary Approaches to Stop Hypertension." The DASH eating plan is a healthy eating plan that has been shown to reduce high blood pressure (hypertension). It may also reduce your risk for type 2 diabetes, heart disease, and stroke. The DASH eating plan may also help with weight loss. What are tips for following this plan? General guidelines  Avoid eating more than 2,300 mg (milligrams) of salt (sodium) a day. If you have hypertension, you may need to reduce your sodium intake to 1,500 mg a day.  Limit alcohol intake to no more than 1 drink a day for nonpregnant women and 2 drinks a day for men. One drink equals 12 oz of beer, 5 oz of wine, or 1 oz of hard liquor.  Work with your health care provider to maintain a healthy body weight or to lose weight. Ask what an ideal weight is for you.  Get at least  30 minutes of exercise that causes your heart to beat faster (aerobic exercise) most days of the week. Activities may include walking, swimming, or biking.  Work with your health care provider or diet and nutrition specialist (dietitian) to adjust your eating plan to your individual calorie needs. Reading food labels  Check food labels for the amount of sodium per serving. Choose foods with less than 5 percent of the Daily Value of sodium. Generally, foods with less than 300 mg of sodium per serving fit into this eating plan.  To find whole grains, look for the word "whole" as the first word in the ingredient list. Shopping  Buy products labeled as "low-sodium" or "no salt added."  Buy fresh foods. Avoid canned foods and premade or frozen meals. Cooking  Avoid adding salt when cooking. Use salt-free seasonings or herbs instead of table  salt or sea salt. Check with your health care provider or pharmacist before using salt substitutes.  Do not fry foods. Cook foods using healthy methods such as baking, boiling, grilling, and broiling instead.  Cook with heart-healthy oils, such as olive, canola, soybean, or sunflower oil. Meal planning   Eat a balanced diet that includes: ? 5 or more servings of fruits and vegetables each day. At each meal, try to fill half of your plate with fruits and vegetables. ? Up to 6-8 servings of whole grains each day. ? Less than 6 oz of lean meat, poultry, or fish each day. A 3-oz serving of meat is about the same size as a deck of cards. One egg equals 1 oz. ? 2 servings of low-fat dairy each day. ? A serving of nuts, seeds, or beans 5 times each week. ? Heart-healthy fats. Healthy fats called Omega-3 fatty acids are found in foods such as flaxseeds and coldwater fish, like sardines, salmon, and mackerel.  Limit how much you eat of the following: ? Canned or prepackaged foods. ? Food that is high in trans fat, such as fried foods. ? Food that is high in  saturated fat, such as fatty meat. ? Sweets, desserts, sugary drinks, and other foods with added sugar. ? Full-fat dairy products.  Do not salt foods before eating.  Try to eat at least 2 vegetarian meals each week.  Eat more home-cooked food and less restaurant, buffet, and fast food.  When eating at a restaurant, ask that your food be prepared with less salt or no salt, if possible. What foods are recommended? The items listed may not be a complete list. Talk with your dietitian about what dietary choices are best for you. Grains Whole-grain or whole-wheat bread. Whole-grain or whole-wheat pasta. Brown rice. [place]. Bulgur. Whole-grain and low-sodium cereals. Pita bread. Low-fat, low-sodium crackers. Whole-wheat flour tortillas. Vegetables Fresh or frozen vegetables (raw, steamed, roasted, or grilled). Low-sodium or reduced-sodium tomato and vegetable juice. Low-sodium or reduced-sodium tomato sauce and tomato paste. Low-sodium or reduced-sodium canned vegetables. Fruits All fresh, dried, or frozen fruit. Canned fruit in natural juice (without added sugar). Meat and other protein foods Skinless chicken or [place]. Ground chicken or [place]. Pork with fat trimmed off. Fish and seafood. Egg whites. Dried beans, peas, or lentils. Unsalted nuts, nut butters, and seeds. Unsalted canned beans. Lean cuts of beef with fat trimmed off. Low-sodium, lean deli meat. Dairy Low-fat (1%) or fat-free (skim) milk. Fat-free, low-fat, or reduced-fat cheeses. Nonfat, low-sodium ricotta or cottage cheese. Low-fat or nonfat yogurt. Low-fat, low-sodium cheese. Fats and oils Soft margarine without trans fats. Vegetable oil. Low-fat, reduced-fat, or light mayonnaise and salad dressings (reduced-sodium). Canola, safflower, olive, soybean, and sunflower oils. Avocado. Seasoning and other foods Herbs. Spices. Seasoning mixes without salt. Unsalted popcorn and pretzels. Fat-free sweets. What foods are not  recommended? The items listed may not be a complete list. Talk with your dietitian about what dietary choices are best for you. Grains Baked goods made with fat, such as croissants, muffins, or some breads. Dry pasta or rice meal packs. Vegetables Creamed or fried vegetables. Vegetables in a cheese sauce. Regular canned vegetables (not low-sodium or reduced-sodium). Regular canned tomato sauce and paste (not low-sodium or reduced-sodium). Regular tomato and vegetable juice (not low-sodium or reduced-sodium). Angie Fava. Olives. Fruits Canned fruit in a light or heavy syrup. Fried fruit. Fruit in cream or butter sauce. Meat and other protein foods Fatty cuts of meat. Ribs. Fried meat. Berniece Salines.  Sausage. Bologna and other processed lunch meats. Salami. Fatback. Hotdogs. Bratwurst. Salted nuts and seeds. Canned beans with added salt. Canned or smoked fish. Whole eggs or egg yolks. Chicken or [place] with skin. Dairy Whole or 2% milk, cream, and half-and-half. Whole or full-fat cream cheese. Whole-fat or sweetened yogurt. Full-fat cheese. Nondairy creamers. Whipped toppings. Processed cheese and cheese spreads. Fats and oils Butter. Stick margarine. Lard. Shortening. Ghee. Bacon fat. Tropical oils, such as coconut, palm kernel, or palm oil. Seasoning and other foods Salted popcorn and pretzels. Onion salt, garlic salt, seasoned salt, table salt, and sea salt. Worcestershire sauce. Tartar sauce. Barbecue sauce. Teriyaki sauce. Soy sauce, including reduced-sodium. Steak sauce. Canned and packaged gravies. Fish sauce. Oyster sauce. Cocktail sauce. Horseradish that you find on the shelf. Ketchup. Mustard. Meat flavorings and tenderizers. Bouillon cubes. Hot sauce and Tabasco sauce. Premade or packaged marinades. Premade or packaged taco seasonings. Relishes. Regular salad dressings. Where to find more information:  National Heart, Lung, and [place]: https://wilson-eaton.com/  American Heart Association:  www.heart.org Summary  The DASH eating plan is a healthy eating plan that has been shown to reduce high blood pressure (hypertension). It may also reduce your risk for type 2 diabetes, heart disease, and stroke.  With the DASH eating plan, you should limit salt (sodium) intake to 2,300 mg a day. If you have hypertension, you may need to reduce your sodium intake to 1,500 mg a day.  When on the DASH eating plan, aim to eat more fresh fruits and vegetables, whole grains, lean proteins, low-fat dairy, and heart-healthy fats.  Work with your health care provider or diet and nutrition specialist (dietitian) to adjust your eating plan to your individual calorie needs. This information is not intended to replace advice given to you by your health care provider. Make sure you discuss any questions you have with your health care provider. Document Released: 05/02/2011 Document Revised: 05/06/2016 Document Reviewed: 05/06/2016 Elsevier Interactive Patient Education  [employer].

## 2017-06-04 NOTE — Progress Notes (Signed)
Subjective:    Patient ID: Gerald Johnson, male    DOB: April 11, 1961, 57 y.o.   MRN: [id number]  No chief complaint on file.   HPI Patient was seen today for acute concern.  Pt endorses chest congestion, nasal drainage, with occasional cough times 2 days.  Pt denies sore throat, fever, chills, headache.  Patient has not taken anything for his symptoms.  Of note patient's BP elevated.  Patient denies headache, chest pain, neck pain, changes in vision.  Patient does not check his blood pressure at home and states he has never had issues with blood pressure in the past.  On chart review patient's BP has been elevated on numerous OFV's.  Patient endorses eating a lot of fast food, not exercising, drinking maybe a bottle of water per day.  Patient also has a history of migraines states takes Excedrin q night.  Past Medical History:  Diagnosis Date  . Acute prostatitis 12/02/2007  . ALLERGIC RHINITIS 02/26/2007  . Asthma   . 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 Hemorrhoids   . HYPERLIPIDEMIA 02/26/2007  . Migraines   . Sciatica 05/04/2008  . SINUSITIS, RECURRENT 02/28/2010    No Known Allergies  ROS General: Denies fever, chills, night sweats, changes in weight, changes in appetite HEENT: Denies ear pain, changes in vision, rhinorrhea, sore throat +HAs, chest congestion, nasal drainage. CV: Denies CP, palpitations, SOB, orthopnea Pulm: Denies SOB, wheezing  + occasional cough GI: Denies abdominal pain, nausea, vomiting, diarrhea, constipation GU: Denies dysuria, hematuria, frequency, vaginal discharge Msk: Denies muscle cramps, joint pains Neuro: Denies weakness, numbness, tingling Skin: Denies rashes, bruising Psych: Denies depression, anxiety, hallucinations     Objective:    Blood pressure (!) 130/100, pulse 79, temperature 98.6 F (37 C), temperature source Oral, weight 249 lb 12.8 oz (113.3 kg), SpO2 97 %.  BP remains elevated 150/110 on repeat  Gen. Pleasant,  well-nourished, in no distress, normal affect   HEENT: [place]/AT, face symmetric, no scleral icterus, PERRLA, nares patent without drainage, pharynx with postnasal drainage, mild erythema, no exudate. Lungs: no accessory muscle use, CTAB, no wheezes or rales Cardiovascular: RRR, no m/r/g, no peripheral edema Neuro:  A&Ox3, CN II-XII intact, normal gait    Wt Readings from Last 3 Encounters:  06/04/17 249 lb 12.8 oz (113.3 kg)  03/14/17 246 lb (111.6 kg)  02/13/17 246 lb (111.6 kg)    Lab Results  Component Value Date   WBC 6.5 08/09/2015   HGB 15.9 08/09/2015   HCT 46.7 08/09/2015   PLT 242.0 08/09/2015   GLUCOSE 95 08/09/2015   CHOL 224 (H) 08/09/2015   TRIG 106.0 08/09/2015   HDL 41.90 08/09/2015   LDLDIRECT 192.7 05/23/2006   LDLCALC 161 (H) 08/09/2015   ALT 32 08/09/2015   AST 25 08/09/2015   NA 140 08/09/2015   K 4.3 08/09/2015   CL 104 08/09/2015   CREATININE 1.24 08/09/2015   BUN 19 08/09/2015   CO2 26 08/09/2015   TSH 0.92 08/09/2015   PSA 3.55 08/09/2015    Assessment/Plan:  Post-nasal drainage  -start supportive care prn -Patient given handout - Plan: fluticasone (FLONASE) 50 MCG/ACT nasal spray  Elevated BP without diagnosis of hypertension -Patient advised likely needs medication to control blood pressure.  Patient hesitant to start medication. -Discussed possible side effects of elevated BP -Discussed lifestyle modifications including increased p.o. intake of water, increasing physical activity, decreasing sodium intake, etc. -Patient given handout -Patient to follow-up in clinic in the next few  days for repeat BP check.  Would also obtain labs at this visit including BMP -Patient given return to ED for clinic precautions.   Grier Mitts, MD

## 2017-06-11 ENCOUNTER — Encounter: Payer: Self-pay | Admitting: Family Medicine

## 2017-06-11 ENCOUNTER — Ambulatory Visit: Payer: 59 | Admitting: Family Medicine

## 2017-06-11 VITALS — BP 140/90 | HR 101 | Temp 98.2°F | Wt 243.3 lb

## 2017-06-11 DIAGNOSIS — M109 Gout, unspecified: Secondary | ICD-10-CM

## 2017-06-11 DIAGNOSIS — R030 Elevated blood-pressure reading, without diagnosis of hypertension: Secondary | ICD-10-CM | POA: Diagnosis not present

## 2017-06-11 MED ORDER — PREDNISONE 10 MG PO TABS: ORAL_TABLET | ORAL | 0 refills | Status: DC

## 2017-06-11 NOTE — Patient Instructions (Addendum)
Gout Gout is painful swelling that can happen in some of your joints. Gout is a type of arthritis. This condition is caused by having too much uric acid in your body. Uric acid is a chemical that is made when your body breaks down substances called purines. If your body has too much uric acid, sharp crystals can form and build up in your joints. This causes pain and swelling. Gout attacks can happen quickly and be very painful (acute gout). Over time, the attacks can affect more joints and happen more often (chronic gout). Follow these instructions at home: During a Gout Attack  If directed, put ice on the painful area: ? Put ice in a plastic bag. ? Place a towel between your skin and the bag. ? Leave the ice on for 20 minutes, 2-3 times a day.  Rest the joint as much as possible. If the joint is in your leg, you may be given crutches to use.  Raise (elevate) the painful joint above the level of your heart as often as you can.  Drink enough fluids to keep your pee (urine) clear or pale yellow.  Take over-the-counter and prescription medicines only as told by your doctor.  Do not drive or use heavy machinery while taking prescription pain medicine.  Follow instructions from your doctor about what you can or cannot eat and drink.  Return to your normal activities as told by your doctor. Ask your doctor what activities are safe for you. Avoiding Future Gout Attacks  Follow a low-purine diet as told by a specialist (dietitian) or your doctor. Avoid foods and drinks that have a lot of purines, such as: ? Liver. ? Kidney. ? Anchovies. ? Asparagus. ? Herring. ? Mushrooms ? Mussels. ? Beer.  Limit alcohol intake to no more than 1 drink a day for nonpregnant women and 2 drinks a day for men. One drink equals 12 oz of beer, 5 oz of wine, or 1 oz of hard liquor.  Stay at a healthy weight or lose weight if you are overweight. If you want to lose weight, talk with your doctor. It is  important that you do not lose weight too fast.  Start or continue an exercise plan as told by your doctor.  Drink enough fluids to keep your pee clear or pale yellow.  Take over-the-counter and prescription medicines only as told by your doctor.  Keep all follow-up visits as told by your doctor. This is important. Contact a doctor if:  You have another gout attack.  You still have symptoms of a gout attack after10 days of treatment.  You have problems (side effects) because of your medicines.  You have chills or a fever.  You have burning pain when you pee (urinate).  You have pain in your lower back or belly. Get help right away if:  You have very bad pain.  Your pain cannot be controlled.  You cannot pee. This information is not intended to replace advice given to you by your health care provider. Make sure you discuss any questions you have with your health care provider. Document Released: 02/20/2008 Document Revised: 10/19/2015 Document Reviewed: 02/23/2015 Elsevier Interactive Patient Education  2018 [place] Eating Plan DASH stands for "Dietary Approaches to Stop Hypertension." The DASH eating plan is a healthy eating plan that has been shown to reduce high blood pressure (hypertension). It may also reduce your risk for type 2 diabetes, heart disease, and stroke. The DASH eating plan may  also help with weight loss. What are tips for following this plan? General guidelines  Avoid eating more than 2,300 mg (milligrams) of salt (sodium) a day. If you have hypertension, you may need to reduce your sodium intake to 1,500 mg a day.  Limit alcohol intake to no more than 1 drink a day for nonpregnant women and 2 drinks a day for men. One drink equals 12 oz of beer, 5 oz of wine, or 1 oz of hard liquor.  Work with your health care provider to maintain a healthy body weight or to lose weight. Ask what an ideal weight is for you.  Get at least 30 minutes of  exercise that causes your heart to beat faster (aerobic exercise) most days of the week. Activities may include walking, swimming, or biking.  Work with your health care provider or diet and nutrition specialist (dietitian) to adjust your eating plan to your individual calorie needs. Reading food labels  Check food labels for the amount of sodium per serving. Choose foods with less than 5 percent of the Daily Value of sodium. Generally, foods with less than 300 mg of sodium per serving fit into this eating plan.  To find whole grains, look for the word "whole" as the first word in the ingredient list. Shopping  Buy products labeled as "low-sodium" or "no salt added."  Buy fresh foods. Avoid canned foods and premade or frozen meals. Cooking  Avoid adding salt when cooking. Use salt-free seasonings or herbs instead of table salt or sea salt. Check with your health care provider or pharmacist before using salt substitutes.  Do not fry foods. Cook foods using healthy methods such as baking, boiling, grilling, and broiling instead.  Cook with heart-healthy oils, such as olive, canola, soybean, or sunflower oil. Meal planning   Eat a balanced diet that includes: ? 5 or more servings of fruits and vegetables each day. At each meal, try to fill half of your plate with fruits and vegetables. ? Up to 6-8 servings of whole grains each day. ? Less than 6 oz of lean meat, poultry, or fish each day. A 3-oz serving of meat is about the same size as a deck of cards. One egg equals 1 oz. ? 2 servings of low-fat dairy each day. ? A serving of nuts, seeds, or beans 5 times each week. ? Heart-healthy fats. Healthy fats called Omega-3 fatty acids are found in foods such as flaxseeds and coldwater fish, like sardines, salmon, and mackerel.  Limit how much you eat of the following: ? Canned or prepackaged foods. ? Food that is high in trans fat, such as fried foods. ? Food that is high in saturated fat,  such as fatty meat. ? Sweets, desserts, sugary drinks, and other foods with added sugar. ? Full-fat dairy products.  Do not salt foods before eating.  Try to eat at least 2 vegetarian meals each week.  Eat more home-cooked food and less restaurant, buffet, and fast food.  When eating at a restaurant, ask that your food be prepared with less salt or no salt, if possible. What foods are recommended? The items listed may not be a complete list. Talk with your dietitian about what dietary choices are best for you. Grains Whole-grain or whole-wheat bread. Whole-grain or whole-wheat pasta. Brown rice. [place]. Bulgur. Whole-grain and low-sodium cereals. Pita bread. Low-fat, low-sodium crackers. Whole-wheat flour tortillas. Vegetables Fresh or frozen vegetables (raw, steamed, roasted, or grilled). Low-sodium or reduced-sodium tomato and vegetable  juice. Low-sodium or reduced-sodium tomato sauce and tomato paste. Low-sodium or reduced-sodium canned vegetables. Fruits All fresh, dried, or frozen fruit. Canned fruit in natural juice (without added sugar). Meat and other protein foods Skinless chicken or [place]. Ground chicken or [place]. Pork with fat trimmed off. Fish and seafood. Egg whites. Dried beans, peas, or lentils. Unsalted nuts, nut butters, and seeds. Unsalted canned beans. Lean cuts of beef with fat trimmed off. Low-sodium, lean deli meat. Dairy Low-fat (1%) or fat-free (skim) milk. Fat-free, low-fat, or reduced-fat cheeses. Nonfat, low-sodium ricotta or cottage cheese. Low-fat or nonfat yogurt. Low-fat, low-sodium cheese. Fats and oils Soft margarine without trans fats. Vegetable oil. Low-fat, reduced-fat, or light mayonnaise and salad dressings (reduced-sodium). Canola, safflower, olive, soybean, and sunflower oils. Avocado. Seasoning and other foods Herbs. Spices. Seasoning mixes without salt. Unsalted popcorn and pretzels. Fat-free sweets. What foods are not recommended? The  items listed may not be a complete list. Talk with your dietitian about what dietary choices are best for you. Grains Baked goods made with fat, such as croissants, muffins, or some breads. Dry pasta or rice meal packs. Vegetables Creamed or fried vegetables. Vegetables in a cheese sauce. Regular canned vegetables (not low-sodium or reduced-sodium). Regular canned tomato sauce and paste (not low-sodium or reduced-sodium). Regular tomato and vegetable juice (not low-sodium or reduced-sodium). Angie Fava. Olives. Fruits Canned fruit in a light or heavy syrup. Fried fruit. Fruit in cream or butter sauce. Meat and other protein foods Fatty cuts of meat. Ribs. Fried meat. Berniece Salines. Sausage. Bologna and other processed lunch meats. Salami. Fatback. Hotdogs. Bratwurst. Salted nuts and seeds. Canned beans with added salt. Canned or smoked fish. Whole eggs or egg yolks. Chicken or [place] with skin. Dairy Whole or 2% milk, cream, and half-and-half. Whole or full-fat cream cheese. Whole-fat or sweetened yogurt. Full-fat cheese. Nondairy creamers. Whipped toppings. Processed cheese and cheese spreads. Fats and oils Butter. Stick margarine. Lard. Shortening. Ghee. Bacon fat. Tropical oils, such as coconut, palm kernel, or palm oil. Seasoning and other foods Salted popcorn and pretzels. Onion salt, garlic salt, seasoned salt, table salt, and sea salt. Worcestershire sauce. Tartar sauce. Barbecue sauce. Teriyaki sauce. Soy sauce, including reduced-sodium. Steak sauce. Canned and packaged gravies. Fish sauce. Oyster sauce. Cocktail sauce. Horseradish that you find on the shelf. Ketchup. Mustard. Meat flavorings and tenderizers. Bouillon cubes. Hot sauce and Tabasco sauce. Premade or packaged marinades. Premade or packaged taco seasonings. Relishes. Regular salad dressings. Where to find more information:  National Heart, Lung, and [place]: https://wilson-eaton.com/  American Heart Association:  www.heart.org Summary  The DASH eating plan is a healthy eating plan that has been shown to reduce high blood pressure (hypertension). It may also reduce your risk for type 2 diabetes, heart disease, and stroke.  With the DASH eating plan, you should limit salt (sodium) intake to 2,300 mg a day. If you have hypertension, you may need to reduce your sodium intake to 1,500 mg a day.  When on the DASH eating plan, aim to eat more fresh fruits and vegetables, whole grains, lean proteins, low-fat dairy, and heart-healthy fats.  Work with your health care provider or diet and nutrition specialist (dietitian) to adjust your eating plan to your individual calorie needs. This information is not intended to replace advice given to you by your health care provider. Make sure you discuss any questions you have with your health care provider. Document Released: 05/02/2011 Document Revised: 05/06/2016 Document Reviewed: 05/06/2016 Elsevier Interactive Patient Education  [employer].  How to Take Your Blood Pressure You can take your blood pressure at home with a machine. You may need to check your blood pressure at home:  To check if you have high blood pressure (hypertension).  To check your blood pressure over time.  To make sure your blood pressure medicine is working.  Supplies needed: You will need a blood pressure machine, or monitor. You can buy one at a drugstore or online. When choosing one:  Choose one with an arm cuff.  Choose one that wraps around your upper arm. Only one finger should fit between your arm and the cuff.  Do not choose one that measures your blood pressure from your wrist or finger.  Your doctor can suggest a monitor. How to prepare Avoid these things for 30 minutes before checking your blood pressure:  Drinking caffeine.  Drinking alcohol.  Eating.  Smoking.  Exercising.  Five minutes before checking your blood pressure:  Pee.  Sit in a dining  chair. Avoid sitting in a soft couch or armchair.  Be quiet. Do not talk.  How to take your blood pressure Follow the instructions that came with your machine. If you have a digital blood pressure monitor, these may be the instructions: 1. Sit up straight. 2. Place your feet on the floor. Do not cross your ankles or legs. 3. Rest your left arm at the level of your heart. You may rest it on a table, desk, or chair. 4. Pull up your shirt sleeve. 5. Wrap the blood pressure cuff around the upper part of your left arm. The cuff should be 1 inch (2.5 cm) above your elbow. It is best to wrap the cuff around bare skin. 6. Fit the cuff snugly around your arm. You should be able to place only one finger between the cuff and your arm. 7. Put the cord inside the groove of your elbow. 8. Press the power button. 9. Sit quietly while the cuff fills with air and loses air. 10. Write down the numbers on the screen. 11. Wait 2-3 minutes and then repeat steps 1-10.  What do the numbers mean? Two numbers make up your blood pressure. The first number is called systolic pressure. The second is called diastolic pressure. An example of a blood pressure reading is "120 over 80" (or 120/80). If you are an adult and do not have a medical condition, use this guide to find out if your blood pressure is normal: Normal  First number: below 120.  Second number: below 80. Elevated  First number: 120-129.  Second number: below 80. Hypertension stage 1  First number: 130-139.  Second number: 80-89. Hypertension stage 2  First number: 140 or above.  Second number: 6 or above. Your blood pressure is above normal even if only the top or bottom number is above normal. Follow these instructions at home:  Check your blood pressure as often as your doctor tells you to.  Take your monitor to your next doctor's appointment. Your doctor will: ? Make sure you are using it correctly. ? Make sure it is working  right.  Make sure you understand what your blood pressure numbers should be.  Tell your doctor if your medicines are causing side effects. Contact a doctor if:  Your blood pressure keeps being high. Get help right away if:  Your first blood pressure number is higher than 180.  Your second blood pressure number is higher than 120. This information is not intended to replace advice  given to you by your health care provider. Make sure you discuss any questions you have with your health care provider. Document Released: 04/25/2008 Document Revised: 04/10/2016 Document Reviewed: 10/20/2015 Elsevier Interactive Patient Education  2018 [employer].  Preventing Hypertension Hypertension, commonly called high blood pressure, is when the force of blood pumping through the arteries is too strong. Arteries are blood vessels that carry blood from the heart throughout the body. Over time, hypertension can damage the arteries and decrease blood flow to important parts of the body, including the brain, heart, and kidneys. Often, hypertension does not cause symptoms until blood pressure is very high. For this reason, it is important to have your blood pressure checked on a regular basis. Hypertension can often be prevented with diet and lifestyle changes. If you already have hypertension, you can control it with diet and lifestyle changes, as well as medicine. What nutrition changes can be made? Maintain a healthy diet. This includes:  Eating less salt (sodium). Ask your health care provider how much sodium is safe for you to have. The general recommendation is to consume less than 1 tsp (2,300 mg) of sodium a day. ? Do not add salt to your food. ? Choose low-sodium options when grocery shopping and eating out.  Limiting fats in your diet. You can do this by eating low-fat or fat-free dairy products and by eating less red meat.  Eating more fruits, vegetables, and whole grains. Make a goal to  eat: ? 1-2 cups of fresh fruits and vegetables each day. ? 3-4 servings of whole grains each day.  Avoiding foods and beverages that have added sugars.  Eating fish that contain healthy fats (omega-3 fatty acids), such as mackerel or salmon.  If you need help putting together a healthy eating plan, try the DASH diet. This diet is high in fruits, vegetables, and whole grains. It is low in sodium, red meat, and added sugars. DASH stands for Dietary Approaches to Stop Hypertension. What lifestyle changes can be made?  Lose weight if you are overweight. Losing just 3?5% of your body weight can help prevent or control hypertension. ? For example, if your present weight is 200 lb (91 kg), a loss of 3-5% of your weight means losing 6-10 lb (2.7-4.5 kg). ? Ask your health care provider to help you with a diet and exercise plan to safely lose weight.  Get enough exercise. Do at least 150 minutes of moderate-intensity exercise each week. ? You could do this in short exercise sessions several times a day, or you could do longer exercise sessions a few times a week. For example, you could take a brisk 10-minute walk or bike ride, 3 times a day, for 5 days a week.  Find ways to reduce stress, such as exercising, meditating, listening to music, or taking a yoga class. If you need help reducing stress, ask your health care provider.  Do not smoke. This includes e-cigarettes. Chemicals in tobacco and nicotine products raise your blood pressure each time you smoke. If you need help quitting, ask your health care provider.  Avoid alcohol. If you drink alcohol, limit alcohol intake to no more than 1 drink a day for nonpregnant women and 2 drinks a day for men. One drink equals 12 oz of beer, 5 oz of wine, or 1 oz of hard liquor. Why are these changes important? Diet and lifestyle changes can help you prevent hypertension, and they may make you feel better overall and improve your quality  of life. If you have  hypertension, making these changes will help you control it and help prevent major complications, such as:  Hardening and narrowing of arteries that supply blood to: ? Your heart. This can cause a heart attack. ? Your brain. This can cause a stroke. ? Your kidneys. This can cause kidney failure.  Stress on your heart muscle, which can cause heart failure.  What can I do to lower my risk?  Work with your health care provider to make a hypertension prevention plan that works for you. Follow your plan and keep all follow-up visits as told by your health care provider.  Learn how to check your blood pressure at home. Make sure that you know your personal target blood pressure, as told by your health care provider. How is this treated? In addition to diet and lifestyle changes, your health care provider may recommend medicines to help lower your blood pressure. You may need to try a few different medicines to find what works best for you. You also may need to take more than one medicine. Take over-the-counter and prescription medicines only as told by your health care provider. Where to find support: Your health care provider can help you prevent hypertension and help you keep your blood pressure at a healthy level. Your local hospital or your community may also provide support services and prevention programs. The American Heart Association offers an online support network at: CheapBootlegs.com.cy Where to find more information: Learn more about hypertension from:  National Heart, Lung, and Blood Institute: ElectronicHangman.is  Centers for Disease Control and Prevention: https://ingram.com/  American Academy of Family Physicians: http://familydoctor.org/familydoctor/en/diseases-conditions/high-blood-pressure.printerview.all.html  Learn more about the DASH diet from:  [place], Lung, and [place]:  https://www.reyes.com/  Contact a health care provider if:  You think you are having a reaction to medicines you have taken.  You have recurrent headaches or feel dizzy.  You have swelling in your ankles.  You have trouble with your vision. Summary  Hypertension often does not cause any symptoms until blood pressure is very high. It is important to get your blood pressure checked regularly.  Diet and lifestyle changes are the most important steps in preventing hypertension.  By keeping your blood pressure in a healthy range, you can prevent complications like heart attack, heart failure, stroke, and kidney failure.  Work with your health care provider to make a hypertension prevention plan that works for you. This information is not intended to replace advice given to you by your health care provider. Make sure you discuss any questions you have with your health care provider. Document Released: 05/28/2015 Document Revised: 01/22/2016 Document Reviewed: 01/22/2016 Elsevier Interactive Patient Education  [employer].

## 2017-06-11 NOTE — Progress Notes (Signed)
Subjective:    Patient ID: Gerald Johnson, male    DOB: 1960/11/15, 57 y.o.   MRN: [id number]  Chief Complaint  Patient presents with  . Follow-up    HPI Patient was seen today for f/u on bp and L knee pain.  Pt states he has been trying to eat better since last OFV.  Patient has decreased salt, been eating salads, stopped eating fried foods.  Patient states he has been eating fruit, salmon, baked chicken.  Patient is also been going to the gym and trying to drink more water.  History of gout.  Patient endorses left knee pain which started on Friday.  Patient states it feels like his typical gout flare.  Patient states he may get a gout flare every 3 months.  Patient has not currently on allopurinol and thinks he had colchicine in the past.  Patient has put a knee brace on his knees to help reduce the swelling.  Past Medical History:  Diagnosis Date  . Acute prostatitis 12/02/2007  . ALLERGIC RHINITIS 02/26/2007  . Asthma   . 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 Hemorrhoids   . HYPERLIPIDEMIA 02/26/2007  . Migraines   . Sciatica 05/04/2008  . SINUSITIS, RECURRENT 02/28/2010    No Known Allergies  ROS General: Denies fever, chills, night sweats, changes in weight, changes in appetite HEENT: Denies headaches, ear pain, changes in vision, rhinorrhea, sore throat CV: Denies CP, palpitations, SOB, orthopnea Pulm: Denies SOB, cough, wheezing GI: Denies abdominal pain, nausea, vomiting, diarrhea, constipation GU: Denies dysuria, hematuria, frequency, vaginal discharge Msk: Denies muscle cramps, joint pains  + left knee pain, edema Neuro: Denies weakness, numbness, tingling Skin: Denies rashes, bruising Psych: Denies depression, anxiety, hallucinations     Objective:    Blood pressure 140/90, pulse (!) 101, temperature 98.2 F (36.8 C), temperature source Oral, weight 243 lb 4.8 oz (110.4 kg).   Gen. Pleasant, well-nourished, in no distress, normal affect   HEENT:  [place]/AT, face symmetric, conjunctiva clear, no scleral icterus, PERRLA, nares patent without drainage,  Lungs: no accessory muscle use, CTAB, no wheezes or rales Cardiovascular: RRR, no m/r/g, no peripheral edema Musculoskeletal: Left knee with knee brace present.  Knee brace removed.  Mild warmth and erythema present.  No effusion noted no crepitus.  Tenderness to palpation of medial joint line.  Right knee is normal.  No cyanosis or clubbing, normal tone Neuro:  A&Ox3, CN II-XII intact, normal gait   Wt Readings from Last 3 Encounters:  06/11/17 243 lb 4.8 oz (110.4 kg)  06/04/17 249 lb 12.8 oz (113.3 kg)  03/14/17 246 lb (111.6 kg)    Lab Results  Component Value Date   WBC 6.5 08/09/2015   HGB 15.9 08/09/2015   HCT 46.7 08/09/2015   PLT 242.0 08/09/2015   GLUCOSE 95 08/09/2015   CHOL 224 (H) 08/09/2015   TRIG 106.0 08/09/2015   HDL 41.90 08/09/2015   LDLDIRECT 192.7 05/23/2006   LDLCALC 161 (H) 08/09/2015   ALT 32 08/09/2015   AST 25 08/09/2015   NA 140 08/09/2015   K 4.3 08/09/2015   CL 104 08/09/2015   CREATININE 1.24 08/09/2015   BUN 19 08/09/2015   CO2 26 08/09/2015   TSH 0.92 08/09/2015   PSA 3.55 08/09/2015    Assessment/Plan:  Elevated BP without diagnosis of hypertension -BP improving though likely elevated secondary to pain. -Continue lifestyle modifications -Patient encouraged to get a blood pressure cuff to check BP at home and keep a  log. -Patient to return in 1 month for follow-up on BP.  Consider obtaining labs at this time.  Acute gout of left knee, unspecified cause  -History of gout flares. -Discussed foods that can cause gout flares -We will treat with prednisone taper times a day - Plan: predniSONE (DELTASONE) 10 MG tablet  Follow-up in 1 month for BP.  Sooner if needed.  Grier Mitts, MD

## 2017-07-15 ENCOUNTER — Ambulatory Visit: Payer: 59 | Admitting: Adult Health

## 2017-07-15 ENCOUNTER — Encounter: Payer: Self-pay | Admitting: Adult Health

## 2017-07-15 VITALS — BP 120/80 | HR 73 | Temp 97.6°F | Wt 246.0 lb

## 2017-07-15 DIAGNOSIS — R030 Elevated blood-pressure reading, without diagnosis of hypertension: Secondary | ICD-10-CM | POA: Diagnosis not present

## 2017-07-15 NOTE — Progress Notes (Signed)
Subjective:    Patient ID: Gerald Johnson, male    DOB: [birth year], 57 y.o.   MRN: [id number]  HPI  Presents for 1 month re-check of high blood pressure. On 06/11/2017 his BP was 140/90 and on several other acute visits, is DBP was 100.  He has modified his diet, started cardio and weight-lifting.  He has cut out fried foods and decreased his salt intake.  He is eating oatmeal every morning to help with his cholesterol.  He is commended for his efforts.     Review of Systems  Constitutional: Negative.   Respiratory: Negative.   Cardiovascular: Negative.   Musculoskeletal: Negative.       Past Medical History:  Diagnosis Date  . Acute prostatitis 12/02/2007  . ALLERGIC RHINITIS 02/26/2007  . Asthma   . 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 Hemorrhoids   . HYPERLIPIDEMIA 02/26/2007  . Migraines   . Sciatica 05/04/2008  . SINUSITIS, RECURRENT 02/28/2010    Social History   Socioeconomic History  . Marital status: Single    Spouse name: Not on file  . Number of children: Not on file  . Years of education: Not on file  . Highest education level: Not on file  Social Needs  . Financial resource strain: Not on file  . Food insecurity - worry: Not on file  . Food insecurity - inability: Not on file  . Transportation needs - medical: Not on file  . Transportation needs - non-medical: Not on file  Occupational History  . Not on file  Tobacco Use  . Smoking status: Never Smoker  . Smokeless tobacco: Never Used  Substance and Sexual Activity  . Alcohol use: Yes    Alcohol/week: 0.0 oz    Comment: occ  . Drug use: No  . Sexual activity: Yes    Birth control/protection: Condom  Other Topics Concern  . Not on file  Social History Narrative   Works as a Nature conservation officer.    Not married    No kids    He likes to go to the gym. Likes to go to go to restaurants.     Past Surgical History:  Procedure Laterality Date  . WISDOM TOOTH EXTRACTION      Family  History  Problem Relation Age of Onset  . Diabetes Brother   . Colon cancer Neg Hx   . Esophageal cancer Neg Hx   . Rectal cancer Neg Hx   . Stomach cancer Neg Hx     No Known Allergies  Current Outpatient Medications on File Prior to Visit  Medication Sig Dispense Refill  . Aspirin-Acetaminophen-Caffeine (EXCEDRIN PO) Take by mouth as needed.    . fluticasone (FLONASE) 50 MCG/ACT nasal spray Place 1 spray into both nostrils daily. 16 g 6  . indomethacin (INDOCIN) 50 MG capsule Take 1 capsule (50 mg total) by mouth 2 (two) times daily with a meal. 60 capsule 3  . SUMAtriptan (IMITREX) 50 MG tablet TAKE 1 TABLET BY MOUTH EVERY 2 HOURS AS NEEDED FOR MIGRAINE.MAY REPEAT IN 2 HOURS IF HEADACHE PERSISTS OR RECURS. 10 tablet 6   No current facility-administered medications on file prior to visit.     BP 120/80 (BP Location: Left Arm)   Pulse 73   Temp 97.6 F (36.4 C) (Oral)   Wt 246 lb (111.6 kg)   SpO2 98%   BMI 31.58 kg/m    Objective:   Physical Exam  Constitutional: He is oriented to person,  place, and time. He appears well-developed and well-nourished. No distress.  Cardiovascular: Normal rate, regular rhythm and normal heart sounds. Exam reveals no gallop and no friction rub.  No murmur heard. Pulmonary/Chest: Effort normal and breath sounds normal. No respiratory distress. He has no wheezes.  Musculoskeletal: Normal range of motion. He exhibits no edema or tenderness.  Neurological: He is alert and oriented to person, place, and time.  Skin: Skin is warm and dry.  Psychiatric: He has a normal mood and affect. His behavior is normal. Judgment and thought content normal.  Nursing note and vitals reviewed.     Assessment & Plan:  1. Elevated blood pressure reading BP well controlled today.  He has made lifestyle modifications with diet and exercise.  Commend him for this.  Continue with diet, exercise and weight loss.  Will recheck lab work with physical in 2 months.

## 2017-07-15 NOTE — Progress Notes (Signed)
Subjective:    Patient ID: Gerald Johnson, male    DOB: Jun 17, 1960, 57 y.o.   MRN: [id number]  HPI  57 year old male who  has a past medical history of Acute prostatitis (12/02/2007), ALLERGIC RHINITIS (02/26/2007), Asthma, ERECTILE DYSFUNCTION, NON-ORGANIC, MILD (11/08/2008), Hemorrhoids, HYPERLIPIDEMIA (02/26/2007), Migraines, Sciatica (05/04/2008), and SINUSITIS, RECURRENT (02/28/2010).   He presents to the office today for one month follow up due to elevated blood pressure readings. He was seen by another PCP one month ago for elevated BP that was thought to be due to pain he was having in his left knee due to an acute gout flare.   Today in the office he reports that he have been working on diet and exercise. He has cut out fried foods, salt, and exercising with weight lifting and cardio.   He has noticed a change in his migraines as they are less severe since he has done life style modifications   BP Readings from Last 3 Encounters:  07/15/17 120/80  06/11/17 140/90  06/04/17 (!) 130/100     Review of Systems See HPI   Past Medical History:  Diagnosis Date  . Acute prostatitis 12/02/2007  . ALLERGIC RHINITIS 02/26/2007  . Asthma   . 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 Hemorrhoids   . HYPERLIPIDEMIA 02/26/2007  . Migraines   . Sciatica 05/04/2008  . SINUSITIS, RECURRENT 02/28/2010    Social History   Socioeconomic History  . Marital status: Single    Spouse name: Not on file  . Number of children: Not on file  . Years of education: Not on file  . Highest education level: Not on file  Social Needs  . Financial resource strain: Not on file  . Food insecurity - worry: Not on file  . Food insecurity - inability: Not on file  . Transportation needs - medical: Not on file  . Transportation needs - non-medical: Not on file  Occupational History  . Not on file  Tobacco Use  . Smoking status: Never Smoker  . Smokeless tobacco: Never Used  Substance and Sexual  Activity  . Alcohol use: Yes    Alcohol/week: 0.0 oz    Comment: occ  . Drug use: No  . Sexual activity: Yes    Birth control/protection: Condom  Other Topics Concern  . Not on file  Social History Narrative   Works as a Nature conservation officer.    Not married    No kids    He likes to go to the gym. Likes to go to go to restaurants.     Past Surgical History:  Procedure Laterality Date  . WISDOM TOOTH EXTRACTION      Family History  Problem Relation Age of Onset  . Diabetes Brother   . Colon cancer Neg Hx   . Esophageal cancer Neg Hx   . Rectal cancer Neg Hx   . Stomach cancer Neg Hx     No Known Allergies  Current Outpatient Medications on File Prior to Visit  Medication Sig Dispense Refill  . Aspirin-Acetaminophen-Caffeine (EXCEDRIN PO) Take by mouth as needed.    . fluticasone (FLONASE) 50 MCG/ACT nasal spray Place 1 spray into both nostrils daily. 16 g 6  . indomethacin (INDOCIN) 50 MG capsule Take 1 capsule (50 mg total) by mouth 2 (two) times daily with a meal. 60 capsule 3  . SUMAtriptan (IMITREX) 50 MG tablet TAKE 1 TABLET BY MOUTH EVERY 2 HOURS AS NEEDED FOR MIGRAINE.MAY REPEAT IN  2 HOURS IF HEADACHE PERSISTS OR RECURS. 10 tablet 6   No current facility-administered medications on file prior to visit.     BP 120/80 (BP Location: Left Arm)   Pulse 73   Temp 97.6 F (36.4 C) (Oral)   Wt 246 lb (111.6 kg)   SpO2 98%   BMI 31.58 kg/m       Objective:   Physical Exam  Constitutional: He is oriented to person, place, and time. He appears well-developed and well-nourished. No distress.  Cardiovascular: Normal rate, regular rhythm, normal heart sounds and intact distal pulses. Exam reveals no gallop and no friction rub.  No murmur heard. Pulmonary/Chest: Effort normal and breath sounds normal. No respiratory distress. He has no wheezes. He has no rales. He exhibits no tenderness.  Musculoskeletal: Normal range of motion. He exhibits no edema, tenderness or  deformity.  Neurological: He is alert and oriented to person, place, and time.  Skin: Skin is warm and dry. No rash noted. He is not diaphoretic. No erythema. No pallor.  Psychiatric: He has a normal mood and affect. His behavior is normal. Judgment and thought content normal.  Nursing note and vitals reviewed.      Assessment & Plan:  1. Elevated blood pressure reading - Continue with current life style modifications  - I am hopeful that this will help with his gout flares as well  - follow up in 2-3 months for CPE   [name], NP

## 2017-07-22 ENCOUNTER — Ambulatory Visit: Payer: 59 | Admitting: Adult Health

## 2017-07-22 ENCOUNTER — Encounter: Payer: Self-pay | Admitting: Family Medicine

## 2017-07-22 ENCOUNTER — Ambulatory Visit: Payer: 59 | Admitting: Family Medicine

## 2017-07-22 VITALS — BP 146/90 | HR 98 | Temp 98.0°F | Ht 74.0 in | Wt 242.3 lb

## 2017-07-22 DIAGNOSIS — J0120 Acute ethmoidal sinusitis, unspecified: Secondary | ICD-10-CM

## 2017-07-22 MED ORDER — AMOXICILLIN 500 MG PO CAPS: 500.0000 mg | ORAL_CAPSULE | Freq: Two times a day (BID) | ORAL | 0 refills | Status: AC

## 2017-07-22 NOTE — Patient Instructions (Addendum)

## 2017-07-22 NOTE — Progress Notes (Signed)
Subjective:    Patient ID: Gerald Johnson, male    DOB: 1960/12/11, 57 y.o.   MRN: [id number]  Chief Complaint  Patient presents with  . Nasal Congestion    HPI Patient was seen today for acute concern.  Patient endorses feeling weak, runny nose, pressure behind his right eye, headache since Friday.  Patient denies sore throat, ear pain/pressure, cough.  Sick contacts include patient's dad who was diagnosed with the flu and is currently in the hospital.  Patient went to visit his dad in the hospital on Sunday.  His symptoms started prior to contact with his father.  Patient has taken NyQuil, TheraFlu, Excedrin.  Past Medical History:  Diagnosis Date  . Acute prostatitis 12/02/2007  . ALLERGIC RHINITIS 02/26/2007  . Asthma   . 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 Hemorrhoids   . HYPERLIPIDEMIA 02/26/2007  . Migraines   . Sciatica 05/04/2008  . SINUSITIS, RECURRENT 02/28/2010    No Known Allergies  ROS General: Denies fever, chills, night sweats, changes in weight, changes in appetite  +weakness HEENT: Denies ear pain, changes in vision, rhinorrhea, sore throat  +HA, rhinorrhea, pressure behind right eye CV: Denies CP, palpitations, SOB, orthopnea Pulm: Denies SOB, cough, wheezing GI: Denies abdominal pain, nausea, vomiting, diarrhea, constipation GU: Denies dysuria, hematuria, frequency, vaginal discharge Msk: Denies muscle cramps, joint pains Neuro: Denies weakness, numbness, tingling Skin: Denies rashes, bruising Psych: Denies depression, anxiety, hallucinations     Objective:    Blood pressure (!) 146/90, pulse 98, temperature 98 F (36.7 C), temperature source Oral, height 6\' 2"  (1.88 m), weight 242 lb 4.8 oz (109.9 kg), SpO2 97 %.   Gen. Pleasant, well-nourished, in no distress, normal affect  HEENT: [place]/AT, face symmetric, no scleral icterus, PERRLA, nares patent without drainage, pharynx with mild erythema, no exudate.  TMs normal bilaterally.  No cervical  lymphadenopathy. Lungs: no accessory muscle use, CTAB, no wheezes or rales Cardiovascular: RRR, no m/r/g, no peripheral edema Abdomen: BS present, soft, NT/ND Neuro:  A&Ox3, CN II-XII intact, normal gait   Wt Readings from Last 3 Encounters:  07/22/17 242 lb 4.8 oz (109.9 kg)  07/15/17 246 lb (111.6 kg)  06/11/17 243 lb 4.8 oz (110.4 kg)    Lab Results  Component Value Date   WBC 6.5 08/09/2015   HGB 15.9 08/09/2015   HCT 46.7 08/09/2015   PLT 242.0 08/09/2015   GLUCOSE 95 08/09/2015   CHOL 224 (H) 08/09/2015   TRIG 106.0 08/09/2015   HDL 41.90 08/09/2015   LDLDIRECT 192.7 05/23/2006   LDLCALC 161 (H) 08/09/2015   ALT 32 08/09/2015   AST 25 08/09/2015   NA 140 08/09/2015   K 4.3 08/09/2015   CL 104 08/09/2015   CREATININE 1.24 08/09/2015   BUN 19 08/09/2015   CO2 26 08/09/2015   TSH 0.92 08/09/2015   PSA 3.55 08/09/2015    Assessment/Plan:  Acute non-recurrent ethmoidal sinusitis  -Given handout -Okay to continue using Flonase - Plan: amoxicillin (AMOXIL) 500 MG capsule  Follow-up PRN  Grier Mitts, MD

## 2017-08-25 ENCOUNTER — Ambulatory Visit: Payer: 59 | Admitting: Family Medicine

## 2017-08-25 ENCOUNTER — Telehealth: Payer: Self-pay | Admitting: Adult Health

## 2017-08-25 ENCOUNTER — Telehealth: Payer: Self-pay | Admitting: *Deleted

## 2017-08-25 ENCOUNTER — Encounter: Payer: Self-pay | Admitting: Family Medicine

## 2017-08-25 VITALS — BP 110/74 | HR 93 | Temp 97.5°F | Wt 242.0 lb

## 2017-08-25 DIAGNOSIS — M109 Gout, unspecified: Secondary | ICD-10-CM

## 2017-08-25 MED ORDER — COLCHICINE 0.6 MG PO TABS: ORAL_TABLET | ORAL | 1 refills | Status: DC

## 2017-08-25 NOTE — Telephone Encounter (Signed)
Prior auth for Colchicine 0.6mg  tablets sent to [email or web address].

## 2017-08-25 NOTE — Progress Notes (Signed)
Subjective:    Patient ID: Gerald Johnson, male    DOB: [birth year], 57 y.o.   MRN: [id number]  Chief Complaint  Patient presents with  . Gout    HPI Patient was seen today for gout flare.  Pt states symptoms started on Friday.  Pt endorses left knee swelling, pain, difficulty walking.  Pt may have 4 gout flares per year. He has taken Advil and Excedrin for this.  Pt has been drinking beer and eating more mushrooms lately.  Past Medical History:  Diagnosis Date  . Acute prostatitis 12/02/2007  . ALLERGIC RHINITIS 02/26/2007  . Asthma   . 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 Hemorrhoids   . HYPERLIPIDEMIA 02/26/2007  . Migraines   . Sciatica 05/04/2008  . SINUSITIS, RECURRENT 02/28/2010    No Known Allergies  ROS General: Denies fever, chills, night sweats, changes in weight, changes in appetite HEENT: Denies headaches, ear pain, changes in vision, rhinorrhea, sore throat CV: Denies CP, palpitations, SOB, orthopnea Pulm: Denies SOB, cough, wheezing GI: Denies abdominal pain, nausea, vomiting, diarrhea, constipation GU: Denies dysuria, hematuria, frequency, vaginal discharge Msk: Denies muscle cramps, joint pains  +L knee pain Neuro: Denies weakness, numbness, tingling Skin: Denies rashes, bruising Psych: Denies depression, anxiety, hallucinations     Objective:    Blood pressure 110/74, pulse 93, temperature (!) 97.5 F (36.4 C), temperature source Oral, weight 242 lb (109.8 kg), SpO2 98 %.   Gen. Pleasant, well-nourished, in no distress, normal affect   HEENT: [place]/AT, face symmetric, no scleral icterus, PERRLA, nares patent without drainage Lungs: no accessory muscle use, CTAB Cardiovascular: RRR, no peripheral edema Musculoskeletal: No deformities, no cyanosis or clubbing, normal tone L knee >R knee.  L knee with increased warmth, no erythema, mild to moderate effusion noted Neuro:  A&Ox3, CN II-XII intact, ambulating with a slight limp    Wt Readings from  Last 3 Encounters:  08/25/17 242 lb (109.8 kg)  07/22/17 242 lb 4.8 oz (109.9 kg)  07/15/17 246 lb (111.6 kg)    Lab Results  Component Value Date   WBC 6.5 08/09/2015   HGB 15.9 08/09/2015   HCT 46.7 08/09/2015   PLT 242.0 08/09/2015   GLUCOSE 95 08/09/2015   CHOL 224 (H) 08/09/2015   TRIG 106.0 08/09/2015   HDL 41.90 08/09/2015   LDLDIRECT 192.7 05/23/2006   LDLCALC 161 (H) 08/09/2015   ALT 32 08/09/2015   AST 25 08/09/2015   NA 140 08/09/2015   K 4.3 08/09/2015   CL 104 08/09/2015   CREATININE 1.24 08/09/2015   BUN 19 08/09/2015   CO2 26 08/09/2015   TSH 0.92 08/09/2015   PSA 3.55 08/09/2015    Assessment/Plan:  Acute gout of left knee, unspecified cause  -Discussed foods known to cause gout flares -Patient given handout -Offered arthrocentesis for mild to moderate effusion, patient declines at this time - Plan: colchicine 0.6 MG tablet.  Patient to take 1.2 mg in 0.6 mg 1 hour later then 0.6 mg daily -Follow-up PRN  Grier Mitts, MD

## 2017-08-25 NOTE — Telephone Encounter (Signed)
Copied from [place] [phone]. Topic: Quick Communication - See Telephone Encounter >> Aug 25, 2017 12:41 PM Hewitt Shorts wrote: CRM for notification. See Telephone encounter for: 08/25/17. Pt states that the medication that Dr. Quay Burow prescribed today colchicine is not covered under patients insurance and would like something else called in to ][facility]

## 2017-08-25 NOTE — Telephone Encounter (Signed)
Updated routing - pt called to check status. Insurance will not cover colchicine.

## 2017-08-25 NOTE — Patient Instructions (Signed)

## 2017-08-27 ENCOUNTER — Other Ambulatory Visit: Payer: Self-pay | Admitting: Family Medicine

## 2017-08-27 DIAGNOSIS — M10062 Idiopathic gout, left knee: Secondary | ICD-10-CM

## 2017-08-27 MED ORDER — PREDNISONE 20 MG PO TABS: ORAL_TABLET | ORAL | 0 refills | Status: DC

## 2017-08-27 NOTE — Telephone Encounter (Signed)
rx for prednisone sent to pharmacy for gout flare.

## 2017-08-27 NOTE — Telephone Encounter (Signed)
Pt advised. Nothing further needed.   

## 2017-08-27 NOTE — Telephone Encounter (Signed)
PA for colchicine has been denied.

## 2017-08-27 NOTE — Telephone Encounter (Signed)
Patient is calling to check on the status of this. He states if his medication can not be changed then he states he can just come in and get a shot. Please advise.

## 2017-08-28 NOTE — Telephone Encounter (Signed)
Fax received from [facility] stating the request was denied and this was given to Dr Volanda Napoleon.

## 2017-09-17 ENCOUNTER — Encounter: Payer: Self-pay | Admitting: Adult Health

## 2017-09-17 ENCOUNTER — Encounter: Payer: 59 | Admitting: Adult Health

## 2017-09-17 DIAGNOSIS — G43909 Migraine, unspecified, not intractable, without status migrainosus: Secondary | ICD-10-CM | POA: Insufficient documentation

## 2017-09-17 DIAGNOSIS — Z2089 Contact with and (suspected) exposure to other communicable diseases: Secondary | ICD-10-CM

## 2017-09-17 NOTE — Progress Notes (Deleted)
Subjective:    Patient ID: Gerald Johnson, male    DOB: February 27, 1961, 57 y.o.   MRN: [id number]  HPI  Patient presents for yearly preventative medicine examination. He is a pleasant 57 year old male who  has a past medical history of Acute prostatitis (12/02/2007), ALLERGIC RHINITIS (02/26/2007), Asthma, ERECTILE DYSFUNCTION, NON-ORGANIC, MILD (11/08/2008), Hemorrhoids, HYPERLIPIDEMIA (02/26/2007), Migraines, Sciatica (05/04/2008), and SINUSITIS, RECURRENT (02/28/2010).  Chronic migraines-takes Imitrex as needed  Hyperlipidemia -history of mild hyperlipidemia.  Currently not on any statins.  Has been working on lifestyle modifications Lab Results  Component Value Date   CHOL 224 (H) 08/09/2015   HDL 41.90 08/09/2015   LDLCALC 161 (H) 08/09/2015   LDLDIRECT 192.7 05/23/2006   TRIG 106.0 08/09/2015   CHOLHDL 5 08/09/2015      All immunizations and health maintenance protocols were reviewed with the patient and needed orders were placed.  Appropriate screening laboratory values were ordered for the patient including screening of hyperlipidemia, renal function and hepatic function. If indicated by BPH, a PSA was ordered.  Medication reconciliation,  past medical history, social history, problem list and allergies were reviewed in detail with the patient  Goals were established with regard to weight loss, exercise, and  diet in compliance with medications.  He has been exercising on a routine basis and is eating a heart healthy diet.  Wt Readings from Last 3 Encounters:  08/25/17 242 lb (109.8 kg)  07/22/17 242 lb 4.8 oz (109.9 kg)  07/15/17 246 lb (111.6 kg)    Is up-to-date on colonoscopies and does routine dental and vision screens  Review of Systems  Constitutional: Negative.   HENT: Negative.   Eyes: Negative.   Respiratory: Negative.   Cardiovascular: Negative.   Gastrointestinal: Negative.   Endocrine: Negative.   Genitourinary: Negative.   Musculoskeletal: Negative.     Skin: Negative.   Allergic/Immunologic: Negative.   Neurological: Negative.   Hematological: Negative.   Psychiatric/Behavioral: Negative.   All other systems reviewed and are negative.  Past Medical History:  Diagnosis Date  . Acute prostatitis 12/02/2007  . ALLERGIC RHINITIS 02/26/2007  . Asthma   . 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 Hemorrhoids   . HYPERLIPIDEMIA 02/26/2007  . Migraines   . Sciatica 05/04/2008  . SINUSITIS, RECURRENT 02/28/2010    Social History   Socioeconomic History  . Marital status: Single    Spouse name: Not on file  . Number of children: Not on file  . Years of education: Not on file  . Highest education level: Not on file  Occupational History  . Not on file  Social Needs  . Financial resource strain: Not on file  . Food insecurity:    Worry: Not on file    Inability: Not on file  . Transportation needs:    Medical: Not on file    Non-medical: Not on file  Tobacco Use  . Smoking status: Never Smoker  . Smokeless tobacco: Never Used  Substance and Sexual Activity  . Alcohol use: Yes    Alcohol/week: 0.0 oz    Comment: occ  . Drug use: No  . Sexual activity: Yes    Birth control/protection: Condom  Lifestyle  . Physical activity:    Days per week: Not on file    Minutes per session: Not on file  . Stress: Not on file  Relationships  . Social connections:    Talks on phone: Not on file    Gets together: Not on file  Attends religious service: Not on file    Active member of club or organization: Not on file    Attends meetings of clubs or organizations: Not on file    Relationship status: Not on file  . Intimate partner violence:    Fear of current or ex partner: Not on file    Emotionally abused: Not on file    Physically abused: Not on file    Forced sexual activity: Not on file  Other Topics Concern  . Not on file  Social History Narrative   Works as a Nature conservation officer.    Not married    No kids    He  likes to go to the gym. Likes to go to go to restaurants.     Past Surgical History:  Procedure Laterality Date  . WISDOM TOOTH EXTRACTION      Family History  Problem Relation Age of Onset  . Diabetes Brother   . Colon cancer Neg Hx   . Esophageal cancer Neg Hx   . Rectal cancer Neg Hx   . Stomach cancer Neg Hx     No Known Allergies  Current Outpatient Medications on File Prior to Visit  Medication Sig Dispense Refill  . Aspirin-Acetaminophen-Caffeine (EXCEDRIN PO) Take by mouth as needed.    . fluticasone (FLONASE) 50 MCG/ACT nasal spray Place 1 spray into both nostrils daily. 16 g 6  . predniSONE (DELTASONE) 20 MG tablet Take 40 mg (2 tabs) until flare begins to resolve. Then 30 mg (1.5 tabs) x 2 days, then 20 mg (1tab) x 2 days, then 10 mg (1/2 tab) x 2 days 30 tablet 0  . SUMAtriptan (IMITREX) 50 MG tablet TAKE 1 TABLET BY MOUTH EVERY 2 HOURS AS NEEDED FOR MIGRAINE.MAY REPEAT IN 2 HOURS IF HEADACHE PERSISTS OR RECURS. 10 tablet 6   No current facility-administered medications on file prior to visit.     There were no vitals taken for this visit.      Objective:   Physical Exam  Constitutional: He is oriented to person, place, and time. He appears well-developed and well-nourished. No distress.  HENT:  Head: Normocephalic and atraumatic.  Right Ear: External ear normal.  Left Ear: External ear normal.  Nose: Nose normal.  Mouth/Throat: Oropharynx is clear and moist. No oropharyngeal exudate.  Eyes: Pupils are equal, round, and reactive to light. Conjunctivae and EOM are normal. Right eye exhibits no discharge. Left eye exhibits no discharge. No scleral icterus.  Neck: Normal range of motion. Neck supple. No JVD present. No tracheal deviation present. No thyromegaly present.  Cardiovascular: Normal rate, regular rhythm, normal heart sounds and intact distal pulses. Exam reveals no gallop and no friction rub.  No murmur heard. Pulmonary/Chest: Effort normal and breath  sounds normal. No stridor. No respiratory distress. He has no wheezes. He has no rales. He exhibits no tenderness.  Abdominal: Soft. Bowel sounds are normal. He exhibits no distension and no mass. There is no tenderness. There is no rebound and no guarding.  Musculoskeletal: Normal range of motion. He exhibits no edema, tenderness or deformity.  Lymphadenopathy:    He has no cervical adenopathy.  Neurological: He is alert and oriented to person, place, and time. He has normal reflexes. He displays normal reflexes. No cranial nerve deficit. He exhibits normal muscle tone. Coordination normal.  Skin: Skin is warm and dry. No rash noted. He is not diaphoretic. No erythema. No pallor.  Psychiatric: He has a normal mood and affect. His  behavior is normal. Judgment and thought content normal.  Nursing note and vitals reviewed.     Assessment & Plan:

## 2017-11-11 ENCOUNTER — Encounter: Payer: Self-pay | Admitting: Family Medicine

## 2017-11-11 ENCOUNTER — Telehealth: Payer: Self-pay | Admitting: Adult Health

## 2017-11-11 ENCOUNTER — Ambulatory Visit: Payer: 59 | Admitting: Adult Health

## 2017-11-11 ENCOUNTER — Encounter: Payer: Self-pay | Admitting: Adult Health

## 2017-11-11 VITALS — BP 140/100 | Temp 97.7°F | Wt 242.0 lb

## 2017-11-11 DIAGNOSIS — G43009 Migraine without aura, not intractable, without status migrainosus: Secondary | ICD-10-CM

## 2017-11-11 MED ORDER — KETOROLAC TROMETHAMINE 60 MG/2ML IM SOLN
60.0000 mg | Freq: Once | INTRAMUSCULAR | Status: AC
  Administered 2017-11-11: 60 mg via INTRAMUSCULAR

## 2017-11-11 NOTE — Telephone Encounter (Signed)
Spoke to the pt.  Advised to wait 24 hours before any changes to medication occurs.  Instructed to take Excedrin.  Pt will call back if needed.

## 2017-11-11 NOTE — Telephone Encounter (Signed)
Copied from [place] [phone]. Topic: Quick Communication - See Telephone Encounter >> Nov 11, 2017  3:00 PM Boyd Kerbs wrote: CRM for notification. See Telephone encounter for: 11/11/17. Tommi Rumps told pt to call if not any better;  Pt calling back saying migraine did not get any better.   [facility] [phone] [address] [place] [address] Phone: [phone] Fax: [phone]

## 2017-11-11 NOTE — Telephone Encounter (Signed)
I would like him to wait at least 24 hours before we change any medications. He can continue to take Excedrin

## 2017-11-11 NOTE — Progress Notes (Signed)
Subjective:    Patient ID: Gerald Johnson, male    DOB: [birth year], 57 y.o.   MRN: [id number]  Headache   This is a recurrent problem. The current episode started yesterday. The problem has been unchanged. The pain is located in the right unilateral region. The pain does not radiate. The pain quality is similar to prior headaches. The quality of the pain is described as sharp. Associated symptoms include eye watering. Pertinent negatives include no blurred vision, dizziness, ear pain, eye pain, facial sweating, fever, phonophobia, photophobia, rhinorrhea, scalp tenderness, sinus pressure, sore throat or visual change. Nothing aggravates the symptoms. He has tried triptans and acetaminophen for the symptoms. The treatment provided no relief.     Review of Systems  Constitutional: Positive for activity change. Negative for chills and fever.  HENT: Positive for sinus pain. Negative for congestion, ear pain, postnasal drip, rhinorrhea, sinus pressure and sore throat.   Eyes: Negative for blurred vision, photophobia and pain.  Respiratory: Negative.   Cardiovascular: Negative.   Neurological: Positive for headaches. Negative for dizziness and light-headedness.   Past Medical History:  Diagnosis Date  . Acute prostatitis 12/02/2007  . ALLERGIC RHINITIS 02/26/2007  . Asthma   . 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 Headache behind the eyes 07/03/2010   New problem noted 27 2012   . Hemorrhoids   . HYPERLIPIDEMIA 02/26/2007  . Migraines   . Sciatica 05/04/2008  . SINUSITIS, RECURRENT 02/28/2010    Social History   Socioeconomic History  . Marital status: Single    Spouse name: Not on file  . Number of children: Not on file  . Years of education: Not on file  . Highest education level: Not on file  Occupational History  . Not on file  Social Needs  . Financial resource strain: Not on file  . Food insecurity:    Worry: Not on file    Inability: Not on file  .  Transportation needs:    Medical: Not on file    Non-medical: Not on file  Tobacco Use  . Smoking status: Never Smoker  . Smokeless tobacco: Never Used  Substance and Sexual Activity  . Alcohol use: Yes    Alcohol/week: 0.0 oz    Comment: occ  . Drug use: No  . Sexual activity: Yes    Birth control/protection: Condom  Lifestyle  . Physical activity:    Days per week: Not on file    Minutes per session: Not on file  . Stress: Not on file  Relationships  . Social connections:    Talks on phone: Not on file    Gets together: Not on file    Attends religious service: Not on file    Active member of club or organization: Not on file    Attends meetings of clubs or organizations: Not on file    Relationship status: Not on file  . Intimate partner violence:    Fear of current or ex partner: Not on file    Emotionally abused: Not on file    Physically abused: Not on file    Forced sexual activity: Not on file  Other Topics Concern  . Not on file  Social History Narrative   Works as a Nature conservation officer.    Not married    No kids    He likes to go to the gym. Likes to go to go to restaurants.     Past Surgical History:  Procedure Laterality Date  .  WISDOM TOOTH EXTRACTION      Family History  Problem Relation Age of Onset  . Diabetes Brother   . Colon cancer Neg Hx   . Esophageal cancer Neg Hx   . Rectal cancer Neg Hx   . Stomach cancer Neg Hx     No Known Allergies  Current Outpatient Medications on File Prior to Visit  Medication Sig Dispense Refill  . Aspirin-Acetaminophen-Caffeine (EXCEDRIN PO) Take by mouth as needed.    . SUMAtriptan (IMITREX) 50 MG tablet TAKE 1 TABLET BY MOUTH EVERY 2 HOURS AS NEEDED FOR MIGRAINE.MAY REPEAT IN 2 HOURS IF HEADACHE PERSISTS OR RECURS. 10 tablet 6  . fluticasone (FLONASE) 50 MCG/ACT nasal spray Place 1 spray into both nostrils daily. (Patient not taking: Reported on 11/11/2017) 16 g 6   No current facility-administered  medications on file prior to visit.     BP (!) 140/100   Temp 97.7 F (36.5 C) (Oral)   Wt 242 lb (109.8 kg)   BMI 31.07 kg/m       Objective:   Physical Exam  Constitutional: He is oriented to person, place, and time. He appears well-developed and well-nourished.  Non-toxic appearance. He does not appear ill. No distress.  HENT:  Head: Normocephalic and atraumatic.  Eyes: Pupils are equal, round, and reactive to light. EOM are normal. Right eye exhibits normal extraocular motion and no nystagmus. Left eye exhibits normal extraocular motion and no nystagmus.  Neck: Normal range of motion. Neck supple.  Cardiovascular: Normal rate, regular rhythm, normal heart sounds and intact distal pulses.  Pulmonary/Chest: Effort normal and breath sounds normal.  Abdominal: Soft. Bowel sounds are normal.  Musculoskeletal: Normal range of motion.  Neurological: He is oriented to person, place, and time. He is not disoriented.  Skin: Skin is warm and dry.  Nursing note and vitals reviewed.      Assessment & Plan:  1. Migraine without aura and without status migrainosus, not intractable - ketorolac (TORADOL) injection 60 mg - Follow up if not improved   [name], NP

## 2017-11-12 ENCOUNTER — Encounter: Payer: Self-pay | Admitting: Family Medicine

## 2017-11-12 ENCOUNTER — Telehealth: Payer: Self-pay | Admitting: *Deleted

## 2017-11-12 MED ORDER — AMITRIPTYLINE HCL 25 MG PO TABS: 25.0000 mg | ORAL_TABLET | Freq: Every day | ORAL | 0 refills | Status: DC

## 2017-11-12 NOTE — Telephone Encounter (Signed)
Copied from [place] [id number]. Topic: Quick Communication - See Telephone Encounter >> Nov 12, 2017  8:40 AM Yvette Rack wrote: Pt calling stating that the headache is still there and the medicine didn't help or the injection  Patient states that he will need another note for work he want be able to make it in to work he may need it to extent it further because he need sometime to get better so that he can return back to work

## 2017-11-12 NOTE — Telephone Encounter (Signed)
Ok to send in work note for another two days.   Ok to send in Elavil 25 mg QHS daily. Advise that this will make him sleepy

## 2017-11-12 NOTE — Telephone Encounter (Signed)
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

## 2017-12-19 ENCOUNTER — Encounter: Payer: Self-pay | Admitting: Family Medicine

## 2017-12-19 ENCOUNTER — Ambulatory Visit: Payer: 59 | Admitting: Family Medicine

## 2017-12-19 ENCOUNTER — Telehealth: Payer: Self-pay | Admitting: Adult Health

## 2017-12-19 VITALS — BP 128/80 | HR 91 | Temp 98.2°F | Wt 248.0 lb

## 2017-12-19 DIAGNOSIS — E782 Mixed hyperlipidemia: Secondary | ICD-10-CM

## 2017-12-19 DIAGNOSIS — M10062 Idiopathic gout, left knee: Secondary | ICD-10-CM | POA: Diagnosis not present

## 2017-12-19 DIAGNOSIS — Z789 Other specified health status: Secondary | ICD-10-CM

## 2017-12-19 MED ORDER — PREDNISONE 20 MG PO TABS: ORAL_TABLET | ORAL | 0 refills | Status: DC

## 2017-12-19 NOTE — Progress Notes (Signed)
Gerald Johnson DOB: 12-31-1960 Encounter date: 12/19/2017  This is a 57 y.o. male who presents with Chief Complaint  Patient presents with  . Gout    flare up x 2 day, not currently on any medication for gout, pt states he has a flare up once ever 2-3 months, Pt states the flare up will last until he comes to the DR     History of present illness:  Sx started 2 days ago. Left knee. Came up suddenly. No known injury. Similar to past flares of gout. Hurts to bend knee. Is on feet at work; which worsens pain. Has responded well to prednisone in the past for flare ups. Typically having flare ups in same knee. Works 12 hour shifts and stands entire time on [employer] which bothers knee the most.  Not sure what triggered gout flare. Possibly shrimp. Does drink beer regularly.     No Known Allergies Current Meds  Medication Sig  . amitriptyline (ELAVIL) 25 MG tablet Take 1 tablet (25 mg total) by mouth at bedtime.  . Aspirin-Acetaminophen-Caffeine (EXCEDRIN PO) Take by mouth as needed.  . fluticasone (FLONASE) 50 MCG/ACT nasal spray Place 1 spray into both nostrils daily.  . SUMAtriptan (IMITREX) 50 MG tablet TAKE 1 TABLET BY MOUTH EVERY 2 HOURS AS NEEDED FOR MIGRAINE.MAY REPEAT IN 2 HOURS IF HEADACHE PERSISTS OR RECURS.    Review of Systems  Constitutional: Negative for chills and fever.  Respiratory: Negative for cough and shortness of breath.   Cardiovascular: Negative for chest pain.  Musculoskeletal: Positive for arthralgias (pain is 7/10. Has been taking excedrin; not sure if helping much. Has been better since being off of knee. ).    Objective:  BP 128/80   Pulse 91   Temp 98.2 F (36.8 C) (Oral)   Wt 248 lb (112.5 kg)   SpO2 97%   BMI 31.84 kg/m   Weight: 248 lb (112.5 kg)   BP Readings from Last 3 Encounters:  12/19/17 128/80  11/11/17 (!) 140/100  08/25/17 110/74   Wt Readings from Last 3 Encounters:  12/19/17 248 lb (112.5 kg)  11/11/17 242 lb (109.8 kg)   08/25/17 242 lb (109.8 kg)    Physical Exam  Constitutional: He appears well-developed and well-nourished. No distress.  Cardiovascular: Normal rate, regular rhythm and normal heart sounds. Exam reveals no friction rub.  No murmur heard. Pulmonary/Chest: Effort normal and breath sounds normal. No respiratory distress. He has no wheezes. He has no rales.  Musculoskeletal:  Left knee is slightly warm to touch with slight erythema. He has pain with full extension and flexion and slight limitation towards the end of ROM. He tolerates palpation of the knee without significant joint line or patellar tenderness. There is some general edema of the knee.   Psychiatric: He has a normal mood and affect.    Assessment/Plan  1. Acute idiopathic gout of left knee Prednisone as directed. Take with food to avoid stomach upset. If not improving after weekend let us know. Prefers to continue work in spite of knee pain. OK to continue with prn OTC medication for pain. Discussed bloodwork for baseline uric acid after flare resolves and consideration for prevention pending that result. Encouraged to schedule physical with regular provider to discuss treatment options. - Uric Acid - CBC with Differential/Platelet - Comprehensive metabolic panel - predniSONE (DELTASONE) 20 MG tablet; Take 3 tabs daily x 3 days, 2 tabs daily x 3 days, 1 tab daily x 2 days, 1/2  tab daily x 2 days  Dispense: 18 tablet; Refill: 0  2. Mixed hyperlipidemia Recheck bloodwork with plans to review at physical. - Comprehensive metabolic panel - Lipid Panel  3. Regular alcohol consumption Discussed association of alcohol and seafood with gout flares. Will check liver enzymes as well.  - Comprehensive metabolic panel   Return for physical exam.   Micheline Rough, MD

## 2017-12-19 NOTE — Telephone Encounter (Signed)
Tried to reach the pt.  Left a message on identified voicemail informing the pt to call back and schedule an appointment as Tommi Rumps is out of the office until next Tuesday.  No further action required.

## 2017-12-19 NOTE — Patient Instructions (Signed)
Take prednisone as directed (with food on stomach). Let us know if any worsening of symptoms or if not improved after prednisone.   Complete bloodwork in a couple of weeks and follow up for physical with your regular provider.

## 2017-12-19 NOTE — Telephone Encounter (Signed)
Copied from [place] [id number]. Topic: Quick Communication - See Telephone Encounter >> Dec 19, 2017  8:10 AM Mylinda Latina, NT wrote: CRM for notification. See Telephone encounter for: 12/19/17. Patient states he is having a Gout flare up and he would like to know if Tommi Rumps can send him in something for the gout   [place], [place] [address] [place] [phone] (Phone) [phone] (Fax)

## 2017-12-20 ENCOUNTER — Encounter: Payer: Self-pay | Admitting: Family Medicine

## 2017-12-22 ENCOUNTER — Other Ambulatory Visit: Payer: Self-pay | Admitting: Family Medicine

## 2017-12-22 ENCOUNTER — Other Ambulatory Visit: Payer: 59

## 2017-12-22 DIAGNOSIS — R972 Elevated prostate specific antigen [PSA]: Secondary | ICD-10-CM

## 2017-12-22 NOTE — Progress Notes (Signed)
Noted PSA elevated at last check (07/2015 from previous 3 years ago); will add to other bloodwork to ensure stability.

## 2018-03-07 ENCOUNTER — Telehealth: Payer: Self-pay | Admitting: Adult Health

## 2018-03-07 DIAGNOSIS — R519 Headache, unspecified: Secondary | ICD-10-CM

## 2018-03-07 DIAGNOSIS — R51 Headache: Principal | ICD-10-CM

## 2018-03-09 NOTE — Telephone Encounter (Signed)
No CPX since 2017.  Please advise.

## 2018-03-10 NOTE — Telephone Encounter (Signed)
Needs CPE 

## 2018-03-12 MED ORDER — SUMATRIPTAN SUCCINATE 50 MG PO TABS: ORAL_TABLET | ORAL | 0 refills | Status: DC

## 2018-03-12 NOTE — Telephone Encounter (Signed)
Sent to the pharmacy by e-scribe. 

## 2018-03-12 NOTE — Telephone Encounter (Signed)
Ok to send in refill

## 2018-03-12 NOTE — Telephone Encounter (Signed)
Pt scheduled CPE for 03/18/18 @ 2pm. Pt would like to see if a partial refill can be sent in to last until that appt? Please advise.

## 2018-03-12 NOTE — Addendum Note (Signed)
Addended by: Miles Costain T on: 03/12/2018 09:28 AM   Modules accepted: Orders

## 2018-03-18 ENCOUNTER — Encounter: Payer: Self-pay | Admitting: Adult Health

## 2018-03-18 ENCOUNTER — Ambulatory Visit (INDEPENDENT_AMBULATORY_CARE_PROVIDER_SITE_OTHER): Payer: 59 | Admitting: Adult Health

## 2018-03-18 ENCOUNTER — Encounter: Payer: 59 | Admitting: Adult Health

## 2018-03-18 VITALS — BP 126/90 | Temp 98.2°F | Ht 73.75 in | Wt 250.0 lb

## 2018-03-18 DIAGNOSIS — Z0000 Encounter for general adult medical examination without abnormal findings: Secondary | ICD-10-CM

## 2018-03-18 DIAGNOSIS — Z125 Encounter for screening for malignant neoplasm of prostate: Secondary | ICD-10-CM

## 2018-03-18 DIAGNOSIS — Z23 Encounter for immunization: Secondary | ICD-10-CM

## 2018-03-18 DIAGNOSIS — G43009 Migraine without aura, not intractable, without status migrainosus: Secondary | ICD-10-CM | POA: Diagnosis not present

## 2018-03-18 DIAGNOSIS — N529 Male erectile dysfunction, unspecified: Secondary | ICD-10-CM

## 2018-03-18 LAB — CBC WITH DIFFERENTIAL/PLATELET
BASOS PCT: 0.6 % (ref 0.0–3.0)
Basophils Absolute: 0 10*3/uL (ref 0.0–0.1)
EOS PCT: 4.8 % (ref 0.0–5.0)
Eosinophils Absolute: 0.3 10*3/uL (ref 0.0–0.7)
HCT: 44.9 % (ref 39.0–52.0)
Hemoglobin: 15.3 g/dL (ref 13.0–17.0)
LYMPHS ABS: 2 10*3/uL (ref 0.7–4.0)
Lymphocytes Relative: 33.5 % (ref 12.0–46.0)
MCHC: 34 g/dL (ref 30.0–36.0)
MCV: 86.3 fl (ref 78.0–100.0)
MONO ABS: 0.6 10*3/uL (ref 0.1–1.0)
Monocytes Relative: 10 % (ref 3.0–12.0)
NEUTROS PCT: 51.1 % (ref 43.0–77.0)
Neutro Abs: 3 10*3/uL (ref 1.4–7.7)
Platelets: 235 10*3/uL (ref 150.0–400.0)
RBC: 5.2 Mil/uL (ref 4.22–5.81)
RDW: 14.1 % (ref 11.5–15.5)
WBC: 5.9 10*3/uL (ref 4.0–10.5)

## 2018-03-18 LAB — BASIC METABOLIC PANEL
BUN: 13 mg/dL (ref 6–23)
CO2: 28 mEq/L (ref 19–32)
Calcium: 9.1 mg/dL (ref 8.4–10.5)
Chloride: 105 mEq/L (ref 96–112)
Creatinine, Ser: 1.25 mg/dL (ref 0.40–1.50)
GFR: 76.48 mL/min (ref >60.00)
GLUCOSE: 91 mg/dL (ref 70–99)
POTASSIUM: 3.8 meq/L (ref 3.5–5.1)
Sodium: 140 mEq/L (ref 135–145)

## 2018-03-18 LAB — HEPATIC FUNCTION PANEL
ALBUMIN: 4.1 g/dL (ref 3.5–5.2)
ALK PHOS: 61 U/L (ref 39–117)
ALT: 20 U/L (ref 0–53)
AST: 18 U/L (ref 0–37)
BILIRUBIN DIRECT: 0.1 mg/dL (ref 0.0–0.3)
BILIRUBIN TOTAL: 0.9 mg/dL (ref 0.2–1.2)
Total Protein: 6.7 g/dL (ref 6.0–8.3)

## 2018-03-18 LAB — LIPID PANEL
Cholesterol: 218 mg/dL — ABNORMAL HIGH (ref 0–200)
HDL: 40.9 mg/dL (ref >39.00)
NONHDL: 176.65
TRIGLYCERIDES: 209 mg/dL — AB (ref 0.0–149.0)
Total CHOL/HDL Ratio: 5
VLDL: 41.8 mg/dL — AB (ref 0.0–40.0)

## 2018-03-18 LAB — LDL CHOLESTEROL, DIRECT: LDL DIRECT: 166 mg/dL

## 2018-03-18 LAB — PSA: PSA: 11.2 ng/mL — ABNORMAL HIGH (ref 0.10–4.00)

## 2018-03-18 LAB — HEMOGLOBIN A1C: Hgb A1c MFr Bld: 5.6 % (ref 4.6–6.5)

## 2018-03-18 LAB — TSH: TSH: 0.6 u[IU]/mL (ref 0.35–4.50)

## 2018-03-18 MED ORDER — AMITRIPTYLINE HCL 25 MG PO TABS: 25.0000 mg | ORAL_TABLET | Freq: Every day | ORAL | 3 refills | Status: DC

## 2018-03-18 MED ORDER — SILDENAFIL CITRATE 50 MG PO TABS: 50.0000 mg | ORAL_TABLET | Freq: Every day | ORAL | 6 refills | Status: DC | PRN

## 2018-03-18 NOTE — Progress Notes (Signed)
Subjective:    Patient ID: Gerald Johnson, male    DOB: Sep 26, 1960, 57 y.o.   MRN: [id number]  HPI  Patient presents for yearly preventative medicine examination. He is a pleasant 57 year old male who  has a past medical history of Acute prostatitis (12/02/2007), ALLERGIC RHINITIS (02/26/2007), Asthma, ERECTILE DYSFUNCTION, NON-ORGANIC, MILD (11/08/2008), Headache behind the eyes (07/03/2010), Hemorrhoids, HYPERLIPIDEMIA (02/26/2007), Migraines, Sciatica (05/04/2008), and SINUSITIS, RECURRENT (02/28/2010).  Migraines - Has been prescribed Elavil 25 mg QHS, but never picked this up. He is willing to try it. He is having 1-3 migraines a week.   Takes Imitrex as needed.  ED - needs refill of Viagra. Has trouble maintaining an erection. This is a chronic issue   All immunizations and health maintenance protocols were reviewed with the patient and needed orders were placed. Due for Tdap and Flu   Appropriate screening laboratory values were ordered for the patient including screening of hyperlipidemia, renal function and hepatic function. If indicated by BPH, a PSA was ordered.  Medication reconciliation,  past medical history, social history, problem list and allergies were reviewed in detail with the patient  Goals were established with regard to weight loss, exercise, and  diet in compliance with medications. He has not been exercising and has not been eating healthy.    He is UTD on his colonoscopy. He has not been seen by the dentist or eye doctor this year  His mom is in the hospital s/p fall and GI bleed. He has been spending a lot of time at [facility]  Constitutional: Negative.   HENT: Negative.   Eyes: Negative.   Respiratory: Negative.   Cardiovascular: Negative.   Gastrointestinal: Negative.   Endocrine: Negative.   Genitourinary: Negative.   Musculoskeletal: Negative.   Skin: Negative.   Allergic/Immunologic: Negative.   Neurological: Positive for  headaches.  Hematological: Negative.   Psychiatric/Behavioral: Negative.   All other systems reviewed and are negative.  Past Medical History:  Diagnosis Date  . Acute prostatitis 12/02/2007  . ALLERGIC RHINITIS 02/26/2007  . Asthma   . 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 Headache behind the eyes 07/03/2010   New problem noted 27 2012   . Hemorrhoids   . HYPERLIPIDEMIA 02/26/2007  . Migraines   . Sciatica 05/04/2008  . SINUSITIS, RECURRENT 02/28/2010    Social History   Socioeconomic History  . Marital status: Single    Spouse name: Not on file  . Number of children: Not on file  . Years of education: Not on file  . Highest education level: Not on file  Occupational History  . Not on file  Social Needs  . Financial resource strain: Not on file  . Food insecurity:    Worry: Not on file    Inability: Not on file  . Transportation needs:    Medical: Not on file    Non-medical: Not on file  Tobacco Use  . Smoking status: Never Smoker  . Smokeless tobacco: Never Used  Substance and Sexual Activity  . Alcohol use: Yes    Alcohol/week: 7.0 standard drinks    Types: 7 Cans of beer per week    Comment: daily  . Drug use: No  . Sexual activity: Yes    Birth control/protection: Condom  Lifestyle  . Physical activity:    Days per week: Not on file    Minutes per session: Not on file  . Stress: Not on file  Relationships  . Social connections:    Talks on phone: Not on file    Gets together: Not on file    Attends religious service: Not on file    Active member of club or organization: Not on file    Attends meetings of clubs or organizations: Not on file    Relationship status: Not on file  . Intimate partner violence:    Fear of current or ex partner: Not on file    Emotionally abused: Not on file    Physically abused: Not on file    Forced sexual activity: Not on file  Other Topics Concern  . Not on file  Social History Narrative   Works as a  Nature conservation officer.    Not married    No kids    He likes to go to the gym. Likes to go to go to restaurants.     Past Surgical History:  Procedure Laterality Date  . WISDOM TOOTH EXTRACTION      Family History  Problem Relation Age of Onset  . Diabetes Brother   . Colon cancer Neg Hx   . Esophageal cancer Neg Hx   . Rectal cancer Neg Hx   . Stomach cancer Neg Hx   . Gout Neg Hx     No Known Allergies  Current Outpatient Medications on File Prior to Visit  Medication Sig Dispense Refill  . Aspirin-Acetaminophen-Caffeine (EXCEDRIN PO) Take by mouth as needed.    . fluticasone (FLONASE) 50 MCG/ACT nasal spray Place 1 spray into both nostrils daily. 16 g 6  . SUMAtriptan (IMITREX) 50 MG tablet TAKE 1 TABLET BY MOUTH EVERY 2 HOURS AS NEEDED FOR MIGRAINE.MAY REPEAT IN 2 HOURS IF HEADACHE PERSISTS OR RECURS. 10 tablet 0   No current facility-administered medications on file prior to visit.     BP 126/90   Temp 98.2 F (36.8 C) (Oral)   Ht 6' 1.75" (1.873 m)   Wt 250 lb (113.4 kg)   BMI 32.32 kg/m       Objective:   Physical Exam  Constitutional: He is oriented to person, place, and time. He appears well-developed and well-nourished. No distress.  Overweight    HENT:  Head: Normocephalic and atraumatic.  Right Ear: External ear normal.  Left Ear: External ear normal.  Nose: Nose normal.  Mouth/Throat: Oropharynx is clear and moist. No oropharyngeal exudate.  Eyes: Pupils are equal, round, and reactive to light. Conjunctivae and EOM are normal. Right eye exhibits no discharge. Left eye exhibits no discharge. No scleral icterus.  Neck: Normal range of motion. Neck supple. No JVD present. No tracheal deviation present. No thyromegaly present.  Cardiovascular: Normal rate, regular rhythm, normal heart sounds and intact distal pulses. Exam reveals no gallop and no friction rub.  No murmur heard. Pulmonary/Chest: Effort normal and breath sounds normal. No stridor. No  respiratory distress. He has no wheezes. He has no rales. He exhibits no tenderness.  Abdominal: Soft. Bowel sounds are normal. He exhibits no distension and no mass. There is no tenderness. There is no rebound and no guarding. No hernia.  Genitourinary:  Genitourinary Comments: Will do PSA  Musculoskeletal: Normal range of motion. He exhibits no edema, tenderness or deformity.  Lymphadenopathy:    He has no cervical adenopathy.  Neurological: He is alert and oriented to person, place, and time. He displays normal reflexes. No cranial nerve deficit or sensory deficit. He exhibits normal muscle tone. Coordination normal.  Skin: Skin is warm  and dry. Capillary refill takes less than 2 seconds. No rash noted. He is not diaphoretic. No erythema. No pallor.  Psychiatric: He has a normal mood and affect. His behavior is normal. Judgment and thought content normal.  Nursing note and vitals reviewed.     Assessment & Plan:  1. Routine general medical examination at a health care facility - Encouraged diet and exercise  - Encouraged to see eye doctor and dentist  - Follow up in one year or sooner if needed  - Flu and Tetanus booster - Basic metabolic panel - CBC with Differential/Platelet - Hepatic function panel - Lipid panel - TSH - Hemoglobin A1c  2. Migraine without aura and without status migrainosus, not intractable  - amitriptyline (ELAVIL) 25 MG tablet; Take 1 tablet (25 mg total) by mouth at bedtime.  Dispense: 90 tablet; Refill: 3 - Follow up if Elavil not working to control migraines   3. Erectile dysfunction, unspecified erectile dysfunction type - Reviewed side effects of medication with the patient  - sildenafil (VIAGRA) 50 MG tablet; Take 1 tablet (50 mg total) by mouth daily as needed for erectile dysfunction (take 1/2 to 1 pill as needed).  Dispense: 10 tablet; Refill: 6 - Basic metabolic panel - CBC with Differential/Platelet - Hepatic function panel - Lipid panel -  TSH  4. Prostate cancer screening  - PSA  [name], NP

## 2018-03-19 ENCOUNTER — Other Ambulatory Visit: Payer: Self-pay | Admitting: Adult Health

## 2018-03-19 DIAGNOSIS — R972 Elevated prostate specific antigen [PSA]: Secondary | ICD-10-CM

## 2018-04-06 ENCOUNTER — Other Ambulatory Visit: Payer: 59

## 2018-07-16 ENCOUNTER — Encounter: Payer: Self-pay | Admitting: Adult Health

## 2018-07-16 ENCOUNTER — Ambulatory Visit: Payer: 59 | Admitting: Adult Health

## 2018-07-16 VITALS — BP 120/84 | Temp 98.8°F | Wt 253.0 lb

## 2018-07-16 DIAGNOSIS — G43001 Migraine without aura, not intractable, with status migrainosus: Secondary | ICD-10-CM | POA: Diagnosis not present

## 2018-07-16 MED ORDER — METHYLPREDNISOLONE ACETATE 80 MG/ML IJ SUSP
80.0000 mg | Freq: Once | INTRAMUSCULAR | Status: AC
  Administered 2018-07-16: 80 mg via INTRAMUSCULAR

## 2018-07-16 MED ORDER — SUMATRIPTAN SUCCINATE 50 MG PO TABS: ORAL_TABLET | ORAL | 2 refills | Status: DC

## 2018-07-16 MED ORDER — KETOROLAC TROMETHAMINE 60 MG/2ML IM SOLN
60.0000 mg | Freq: Once | INTRAMUSCULAR | Status: AC
  Administered 2018-07-16: 60 mg via INTRAMUSCULAR

## 2018-07-16 NOTE — Progress Notes (Signed)
Subjective:    Patient ID: ROSIE GOLSON, male    DOB: May 19, 1961, 58 y.o.   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  He reports to the office today for two days of headache. He feels as though this feels like his typical migraine. He denies fevers, blurred vision, photophobia or phonophobia. Pain is located behind both eyes.    He denies fevers, blurred vision, photophobia, phonophobia.    Review of Systems  Constitutional: Positive for activity change.  Respiratory: Negative.   Genitourinary: Negative.   Neurological: Positive for headaches.  Hematological: Negative.   All other systems reviewed and are negative.  Past Medical History:  Diagnosis Date  . Acute prostatitis 12/02/2007  . ALLERGIC RHINITIS 02/26/2007  . Asthma   . 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 Headache behind the eyes 07/03/2010   New problem noted 27 2012   . Hemorrhoids   . HYPERLIPIDEMIA 02/26/2007  . Migraines   . Sciatica 05/04/2008  . SINUSITIS, RECURRENT 02/28/2010    Social History   Socioeconomic History  . Marital status: Single    Spouse name: Not on file  . Number of children: Not on file  . Years of education: Not on file  . Highest education level: Not on file  Occupational History  . Not on file  Social Needs  . Financial resource strain: Not on file  . Food insecurity:    Worry: Not on file    Inability: Not on file  . Transportation needs:    Medical: Not on file    Non-medical: Not on file  Tobacco Use  . Smoking status: Never Smoker  . Smokeless tobacco: Never Used  Substance and Sexual Activity  . Alcohol use: Yes    Alcohol/week: 7.0 standard drinks   Types: 7 Cans of beer per week    Comment: daily  . Drug use: No  . Sexual activity: Yes    Birth control/protection: Condom  Lifestyle  . Physical activity:    Days per week: Not on file    Minutes per session: Not on file  . Stress: Not on file  Relationships  . Social connections:    Talks on phone: Not on file    Gets together: Not on file    Attends religious service: Not on file    Active member of club or organization: Not on file    Attends meetings of clubs or organizations: Not on file    Relationship status: Not on file  . Intimate partner violence:    Fear of current or ex partner: Not on file    Emotionally abused: Not on file    Physically abused: Not on file    Forced sexual activity: Not on file  Other Topics Concern  . Not on file  Social History Narrative   Works as a Nature conservation officer.    Not married    No kids    He likes to go to the gym. Likes to go to go to restaurants.     Past Surgical History:  Procedure Laterality Date  . WISDOM TOOTH EXTRACTION      Family History  Problem Relation Age of Onset  . Diabetes Brother   . Colon cancer Neg Hx   .  Esophageal cancer Neg Hx   . Rectal cancer Neg Hx   . Stomach cancer Neg Hx   . Gout Neg Hx     No Known Allergies  Current Outpatient Medications on File Prior to Visit  Medication Sig Dispense Refill  . amitriptyline (ELAVIL) 25 MG tablet Take 1 tablet (25 mg total) by mouth at bedtime. 90 tablet 3  . Aspirin-Acetaminophen-Caffeine (EXCEDRIN PO) Take by mouth as needed.    . fluticasone (FLONASE) 50 MCG/ACT nasal spray Place 1 spray into both nostrils daily. 16 g 6  . sildenafil (VIAGRA) 50 MG tablet Take 1 tablet (50 mg total) by mouth daily as needed for erectile dysfunction (take 1/2 to 1 pill as needed). 10 tablet 6  . SUMAtriptan (IMITREX) 50 MG tablet TAKE 1 TABLET BY MOUTH EVERY 2 HOURS AS NEEDED FOR MIGRAINE.MAY REPEAT IN 2 HOURS IF HEADACHE PERSISTS OR RECURS. 10 tablet 0   No current  facility-administered medications on file prior to visit.     BP 120/84   Temp 98.8 F (37.1 C)   Wt 253 lb (114.8 kg)   BMI 32.70 kg/m       Objective:   Physical Exam Vitals signs and nursing note reviewed.  Constitutional:      Appearance: Normal appearance.  HENT:     Right Ear: Tympanic membrane and ear canal normal.     Left Ear: Tympanic membrane and ear canal normal.     Nose: Nose normal. No congestion or rhinorrhea.     Mouth/Throat:     Mouth: Mucous membranes are moist.     Pharynx: Oropharynx is clear.  Eyes:     Extraocular Movements: Extraocular movements intact.     Pupils: Pupils are equal, round, and reactive to light.  Cardiovascular:     Rate and Rhythm: Normal rate and regular rhythm.     Pulses: Normal pulses.     Heart sounds: Normal heart sounds.  Pulmonary:     Effort: Pulmonary effort is normal.     Breath sounds: Normal breath sounds.  Skin:    General: Skin is warm and dry.     Capillary Refill: Capillary refill takes less than 2 seconds.  Neurological:     General: No focal deficit present.     Mental Status: He is alert and oriented to person, place, and time.  Psychiatric:        Mood and Affect: Mood normal.        Behavior: Behavior normal.        Thought Content: Thought content normal.        Judgment: Judgment normal.       Assessment & Plan:  1. Migraine without aura and with status migrainosus, not intractable  - ketorolac (TORADOL) injection 60 mg - methylPREDNISolone acetate (DEPO-MEDROL) injection 80 mg - SUMAtriptan (IMITREX) 50 MG tablet; TAKE 1 TABLET BY MOUTH EVERY 2 HOURS AS NEEDED FOR MIGRAINE.MAY REPEAT IN 2 HOURS IF HEADACHE PERSISTS OR RECURS.  Dispense: 10 tablet; Refill: 2  [name], NP

## 2018-07-16 NOTE — Patient Instructions (Signed)
It was great seeing you today   We have given you an injection of Toradol and Prednisone. This should have you feeling better in 30-60 minutes.   I have also sent in a refill of Imitrex

## 2018-10-23 ENCOUNTER — Telehealth: Payer: Self-pay | Admitting: Adult Health

## 2018-10-23 ENCOUNTER — Other Ambulatory Visit: Payer: Self-pay | Admitting: Adult Health

## 2018-10-23 MED ORDER — PREDNISONE 10 MG PO TABS: ORAL_TABLET | ORAL | 0 refills | Status: DC

## 2018-10-23 NOTE — Telephone Encounter (Signed)
Prednisone sent to pharmacy

## 2018-10-23 NOTE — Telephone Encounter (Signed)
Copied from [place] [id number]. Topic: Quick Communication - See Telephone Encounter >> Oct 23, 2018  3:22 PM Vernona Rieger wrote: CRM for notification. See Telephone encounter for: 10/23/18. Patient said he is having a gout flare up on his left knee and said Tommi Rumps has wrote him a RX before for this. He said he would like that called in again. Advised him he may need an appt. Please advise

## 2018-10-23 NOTE — Telephone Encounter (Signed)
Left a message on identified voicemail informing Merry Proud to pick up rx at the pharmacy.

## 2018-12-10 ENCOUNTER — Encounter: Payer: Self-pay | Admitting: Adult Health

## 2018-12-10 ENCOUNTER — Ambulatory Visit: Payer: 59 | Admitting: Adult Health

## 2018-12-10 ENCOUNTER — Other Ambulatory Visit: Payer: Self-pay | Admitting: Adult Health

## 2018-12-10 ENCOUNTER — Ambulatory Visit (INDEPENDENT_AMBULATORY_CARE_PROVIDER_SITE_OTHER): Payer: 59 | Admitting: Adult Health

## 2018-12-10 DIAGNOSIS — G43009 Migraine without aura, not intractable, without status migrainosus: Secondary | ICD-10-CM | POA: Diagnosis not present

## 2018-12-10 DIAGNOSIS — G43001 Migraine without aura, not intractable, with status migrainosus: Secondary | ICD-10-CM

## 2018-12-10 DIAGNOSIS — Z0289 Encounter for other administrative examinations: Secondary | ICD-10-CM

## 2018-12-10 MED ORDER — AMITRIPTYLINE HCL 50 MG PO TABS: 50.0000 mg | ORAL_TABLET | Freq: Every day | ORAL | 0 refills | Status: DC

## 2018-12-10 MED ORDER — PREDNISONE 20 MG PO TABS: 20.0000 mg | ORAL_TABLET | Freq: Every day | ORAL | 0 refills | Status: DC

## 2018-12-10 MED ORDER — SUMATRIPTAN SUCCINATE 50 MG PO TABS: ORAL_TABLET | ORAL | 2 refills | Status: DC

## 2018-12-10 NOTE — Telephone Encounter (Signed)
DENIED.  FILLED 12/10/2018

## 2018-12-10 NOTE — Progress Notes (Signed)
Virtual Visit via Telephone Note  I connected with Gerald Johnson on 12/10/18 at  3:30 PM EDT by telephone and verified that I am speaking with the correct person using two identifiers.   I discussed the limitations, risks, security and privacy concerns of performing an evaluation and management service by telephone and the availability of in person appointments. I also discussed with the patient that there may be a patient responsible charge related to this service. The patient expressed understanding and agreed to proceed.  Location patient: home Location provider: work or home office Participants present for the call: patient, provider Patient did not have a visit in the prior 7 days to address this/these issue(s).   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  He is being evaluated for migraine headache. This is a chronic issue. He has been taking Elavil 25 mg QHS but has not noticed any significant improvement in the amount of migraines he gets monthly. Usually Imitrex will abort his migraine but over the last two days he has not been able to get rid of his migraine headache. He feels as though this is a typical migraine for him. It is located on the left side, behind his eye.   He denies sensitivity to light or sound. He has not had any blurred vision    Observations/Objective: Patient sounds cheerful and well on the phone. I do not appreciate any SOB. Speech and thought processing are grossly intact. Patient reported vitals:  Assessment and Plan: 1. Migraine without aura and without status migrainosus, not intractable - Will increase Elavil to 50 mg QHS. Order MRI of the brain.  Will send in a short course of prednisone to help with his current migraine  - Follow up if no improvement in the next 2-3 days  - Consider referral to headache clinic  - predniSONE (DELTASONE) 20 MG tablet; Take 1 tablet (20 mg total) by mouth daily with breakfast.  Dispense: 5 tablet; Refill: 0 - MR Brain Wo Contrast; Future - amitriptyline (ELAVIL) 50 MG tablet; Take 1 tablet (50 mg total) by mouth at bedtime.  Dispense: 90 tablet; Refill: 0 - SUMAtriptan (IMITREX) 50 MG tablet; TAKE 1 TABLET BY MOUTH EVERY 2 HOURS AS NEEDED FOR MIGRAINE.MAY REPEAT IN 2 HOURS IF HEADACHE PERSISTS OR RECURS.  Dispense: 10 tablet; Refill: 2   Follow Up Instructions:   I did not refer this patient for an OV in the next 24 hours for this/these issue(s).  I discussed the assessment and treatment plan with the patient. The patient was provided an opportunity to ask questions and all were answered. The patient agreed with the plan and demonstrated an understanding of the instructions.   The patient was advised to call back or seek an in-person evaluation if the symptoms worsen or if the condition fails to improve as anticipated.  I provided 15 minutes of non-face-to-face time during this encounter.   [name], NP

## 2018-12-24 ENCOUNTER — Telehealth: Payer: Self-pay | Admitting: Adult Health

## 2018-12-24 MED ORDER — PREDNISONE 20 MG PO TABS: 20.0000 mg | ORAL_TABLET | Freq: Every day | ORAL | 0 refills | Status: DC

## 2018-12-24 NOTE — Telephone Encounter (Signed)
Pt called in and is requesting the steroid that was prescribed to him for his headaches. Please advise.   [facility], [address]  [place] [address]  Phone: [phone] Fax: [phone]  Not a 24 hour pharmacy; exact hours not known.

## 2018-12-24 NOTE — Telephone Encounter (Signed)
Patient is calling back with the name of the medication that he would like refilled at the [facility] [place]   predniSONE (DELTASONE) 20 MG tablet [[id number]]

## 2019-01-19 ENCOUNTER — Other Ambulatory Visit: Payer: 59

## 2019-03-04 ENCOUNTER — Telehealth (INDEPENDENT_AMBULATORY_CARE_PROVIDER_SITE_OTHER): Payer: 59 | Admitting: Adult Health

## 2019-03-04 ENCOUNTER — Other Ambulatory Visit: Payer: Self-pay

## 2019-03-04 DIAGNOSIS — J301 Allergic rhinitis due to pollen: Secondary | ICD-10-CM

## 2019-03-04 DIAGNOSIS — R519 Headache, unspecified: Secondary | ICD-10-CM | POA: Diagnosis not present

## 2019-03-04 MED ORDER — MOMETASONE FUROATE 50 MCG/ACT NA SUSP: 2.0000 | Freq: Every day | NASAL | 12 refills | Status: DC

## 2019-03-04 NOTE — Progress Notes (Signed)
Virtual Visit via Telephone Note  I connected with Gerald Johnson on 03/04/19 at  1:00 PM EDT by telephone and verified that I am speaking with the correct person using two identifiers.   I discussed the limitations, risks, security and privacy concerns of performing an evaluation and management service by telephone and the availability of in person appointments. I also discussed with the patient that there may be a patient responsible charge related to this service. The patient expressed understanding and agreed to proceed.  Location patient: home Location provider: work or home office Participants present for the call: patient, provider Patient did not have a visit in the prior 7 days to address this/these issue(s).   History of Present Illness: He is being evaluated today for an acute issue.  Symptoms started 3 days ago.  His symptoms include clear rhinorrhea, fatigue, sinus pressure, itchy watery eyes, and mild headache ( does not feel like typical migraine).  Denies fevers, chills, SOB, cough nausea, vomiting, or diarrhea.  He has been using over-the-counter DayQuil and TheraFlu without relief    Observations/Objective: Patient sounds cheerful and well on the phone. I do not appreciate any SOB. Speech and thought processing are grossly intact. Patient reported vitals:  Assessment and Plan:  1. Acute nonintractable headache, unspecified headache type -Likely due to seasonal allergies.  Will prescribe Nasonex.  He can take Tylenol or Motrin for symptom relief.  He was advised to follow-up if no improvement over the next week or if fever develops - mometasone (NASONEX) 50 MCG/ACT nasal spray; Place 2 sprays into the nose daily.  Dispense: 17 g; Refill: 12  2. Seasonal allergic rhinitis due to pollen - mometasone (NASONEX) 50 MCG/ACT nasal spray; Place 2 sprays into the nose daily.  Dispense: 17 g; Refill: 12   Follow Up Instructions:   I did not refer this patient for an OV in  the next 24 hours for this/these issue(s).  I discussed the assessment and treatment plan with the patient. The patient was provided an opportunity to ask questions and all were answered. The patient agreed with the plan and demonstrated an understanding of the instructions.   The patient was advised to call back or seek an in-person evaluation if the symptoms worsen or if the condition fails to improve as anticipated.  I provided 15 minutes of non-face-to-face time during this encounter.   [name], NP

## 2019-03-04 NOTE — Progress Notes (Signed)
   Subjective:    Patient ID: Gerald Johnson, male    DOB: 12/13/1960, 58 y.o.   MRN: [id number]  HPI    Review of Systems     Objective:   Physical Exam        Assessment & Plan:

## 2019-03-23 ENCOUNTER — Other Ambulatory Visit: Payer: Self-pay

## 2019-03-23 ENCOUNTER — Telehealth (INDEPENDENT_AMBULATORY_CARE_PROVIDER_SITE_OTHER): Payer: 59 | Admitting: Adult Health

## 2019-03-23 DIAGNOSIS — J0100 Acute maxillary sinusitis, unspecified: Secondary | ICD-10-CM | POA: Diagnosis not present

## 2019-03-23 MED ORDER — AMOXICILLIN-POT CLAVULANATE 875-125 MG PO TABS: 1.0000 | ORAL_TABLET | Freq: Two times a day (BID) | ORAL | 0 refills | Status: DC

## 2019-03-23 NOTE — Progress Notes (Signed)
Virtual Visit via Video Note  I connected with Gerald Johnson  on 03/23/19 at  1:00 PM EDT by a video enabled telemedicine application and verified that I am speaking with the correct person using two identifiers.  Location patient: home Location provider:work or home office Persons participating in the virtual visit: patient, provider  I discussed the limitations of evaluation and management by telemedicine and the availability of in person appointments. The patient expressed understanding and agreed to proceed.   HPI: He is a 58 year old male who is being evaluated today for follow-up regarding sinus pain and pressure. He was seen about 2 weeks ago at which time he reported that his symptoms included clear rhinorrhea, fatigue, sinus pain and pressure, itchy watery eyes, and mild headache. He denied fevers, chills shortness of breath, cough, nausea, vomiting, or diarrhea. This time it was thought that his symptoms were due to seasonal allergies and he was prescribed Nasonex.  Today he reports that he continues to have sinus pain and pressure especially behind his eyes and forehead.  He has been using the nasal spray without any relief in symptoms.  He is not having rhinorrhea but does feel congested in the nasal passageways.  Denies headaches, chills, nausea, vomiting, or diarrhea.  He has also tried various over-the-counter sinus medications as well as Nettie pot and this did not seem to help much.   ROS: See pertinent positives and negatives per HPI.  Past Medical History:  Diagnosis Date  . Acute prostatitis 12/02/2007  . ALLERGIC RHINITIS 02/26/2007  . Asthma   . 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 Headache behind the eyes 07/03/2010   New problem noted 27 2012   . Hemorrhoids   . HYPERLIPIDEMIA 02/26/2007  . Migraines   . Sciatica 05/04/2008  . SINUSITIS, RECURRENT 02/28/2010    Past Surgical History:  Procedure Laterality Date  . WISDOM TOOTH EXTRACTION       Family History  Problem Relation Age of Onset  . Diabetes Brother   . Colon cancer Neg Hx   . Esophageal cancer Neg Hx   . Rectal cancer Neg Hx   . Stomach cancer Neg Hx   . Gout Neg Hx      Current Outpatient Medications:  .  amitriptyline (ELAVIL) 50 MG tablet, Take 1 tablet (50 mg total) by mouth at bedtime., Disp: 90 tablet, Rfl: 0 .  Aspirin-Acetaminophen-Caffeine (EXCEDRIN PO), Take by mouth as needed., Disp: , Rfl:  .  mometasone (NASONEX) 50 MCG/ACT nasal spray, Place 2 sprays into the nose daily., Disp: 17 g, Rfl: 12 .  sildenafil (VIAGRA) 50 MG tablet, Take 1 tablet (50 mg total) by mouth daily as needed for erectile dysfunction (take 1/2 to 1 pill as needed)., Disp: 10 tablet, Rfl: 6 .  SUMAtriptan (IMITREX) 50 MG tablet, TAKE 1 TABLET BY MOUTH EVERY 2 HOURS AS NEEDED FOR MIGRAINE.MAY REPEAT IN 2 HOURS IF HEADACHE PERSISTS OR RECURS., Disp: 10 tablet, Rfl: 2  EXAM:  VITALS per patient if applicable:  GENERAL: alert, oriented, appears well and in no acute distress  HEENT: atraumatic, conjunttiva clear, no obvious abnormalities on inspection of external nose and ears  NECK: normal movements of the head and neck  LUNGS: on inspection no signs of respiratory distress, breathing rate appears normal, no obvious gross SOB, gasping or wheezing  CV: no obvious cyanosis  MS: moves all visible extremities without noticeable abnormality  PSYCH/NEURO: pleasant and cooperative, no obvious depression or anxiety, speech and thought processing grossly  intact  ASSESSMENT AND PLAN:  Discussed the following assessment and plan:  1. Acute non-recurrent maxillary sinusitis - Will treat as sinusitis due to symptoms and time frame. Was advised to continue to continue to use nasonex but stop other OTC medications.  - amoxicillin-clavulanate (AUGMENTIN) 875-125 MG tablet; Take 1 tablet by mouth 2 (two) times daily.  Dispense: 20 tablet; Refill: 0 - Follow up if no improvement in the  next 2-3 days     I discussed the assessment and treatment plan with the patient. The patient was provided an opportunity to ask questions and all were answered. The patient agreed with the plan and demonstrated an understanding of the instructions.   The patient was advised to call back or seek an in-person evaluation if the symptoms worsen or if the condition fails to improve as anticipated.   [name], NP

## 2019-04-16 ENCOUNTER — Telehealth (INDEPENDENT_AMBULATORY_CARE_PROVIDER_SITE_OTHER): Payer: 59 | Admitting: Adult Health

## 2019-04-16 ENCOUNTER — Other Ambulatory Visit: Payer: Self-pay

## 2019-04-16 DIAGNOSIS — J321 Chronic frontal sinusitis: Secondary | ICD-10-CM | POA: Diagnosis not present

## 2019-04-16 NOTE — Progress Notes (Signed)
Virtual Visit via Telephone Note  I connected with Gerald Johnson on 04/16/19 at  3:00 PM EST by telephone and verified that I am speaking with the correct person using two identifiers.   I discussed the limitations, risks, security and privacy concerns of performing an evaluation and management service by telephone and the availability of in person appointments. I also discussed with the patient that there may be a patient responsible charge related to this service. The patient expressed understanding and agreed to proceed.  Location patient: home Location provider: work or home office Participants present for the call: patient, provider Patient did not have a visit in the prior 7 days to address this/these issue(s).   History of Present Illness: 58 year old male who is being evaluated today for an acute on chronic issue of sinus pain and pressure, mild headache, and discolored rhinorrhea.  He denies fevers, chills, shortness of breath, cough, nausea, vomiting, or diarrhea.  The end of October he was treated for sinusitis with a course of Augmentin and reports that this eventually got rid of his sinus action.  Unfortunately his symptoms came back 2 days ago.  He has been using Sudafed and Nasonex with some improvement that is short-lived   Observations/Objective: Patient sounds cheerful and well on the phone. I do not appreciate any SOB. Speech and thought processing are grossly intact. Patient reported vitals:  Assessment and Plan: 1. Chronic frontal sinusitis -Due to repetitive nature of sinusitis-like symptoms.  I am going to refer him to ear nose and throat for further evaluation.  He was advised that it was too early to start antibiotic therapy.  He can continue to use Nasonex but use Sudafed sparingly. - Ambulatory referral to ENT   Follow Up Instructions:  I did not refer this patient for an OV in the next 24 hours for this/these issue(s).  I discussed the assessment and  treatment plan with the patient. The patient was provided an opportunity to ask questions and all were answered. The patient agreed with the plan and demonstrated an understanding of the instructions.   The patient was advised to call back or seek an in-person evaluation if the symptoms worsen or if the condition fails to improve as anticipated.  I provided 15 minutes of non-face-to-face time during this encounter.   [name], NP

## 2019-04-19 ENCOUNTER — Telehealth: Payer: Self-pay | Admitting: Adult Health

## 2019-04-19 NOTE — Telephone Encounter (Signed)
Pt would like something called in for his sinus infection due to the pain/ please advise

## 2019-04-20 ENCOUNTER — Other Ambulatory Visit: Payer: Self-pay | Admitting: Adult Health

## 2019-04-20 ENCOUNTER — Telehealth: Payer: Self-pay | Admitting: Family Medicine

## 2019-04-20 MED ORDER — AMOXICILLIN-POT CLAVULANATE 875-125 MG PO TABS: 1.0000 | ORAL_TABLET | Freq: Two times a day (BID) | ORAL | 0 refills | Status: DC

## 2019-04-20 NOTE — Telephone Encounter (Signed)
Spoke to the pt.  He sounds very congested and stuffy.  Complains of sinus pain and sinus headache.  OTC medication not working.  Requesting antibiotic.  Denied fever.

## 2019-04-20 NOTE — Telephone Encounter (Signed)
Augmentin sent to pharmacy.

## 2019-04-20 NOTE — Telephone Encounter (Signed)
Pt notified to pick up rx from the pharmacy.  Nothing further needed.

## 2019-06-04 ENCOUNTER — Other Ambulatory Visit: Payer: Self-pay

## 2019-06-07 ENCOUNTER — Other Ambulatory Visit: Payer: Self-pay

## 2019-06-07 ENCOUNTER — Ambulatory Visit: Payer: 59 | Admitting: Family Medicine

## 2019-06-07 ENCOUNTER — Encounter: Payer: Self-pay | Admitting: Family Medicine

## 2019-06-07 VITALS — BP 118/84 | HR 76 | Temp 97.2°F | Wt 255.0 lb

## 2019-06-07 DIAGNOSIS — J0110 Acute frontal sinusitis, unspecified: Secondary | ICD-10-CM

## 2019-06-07 MED ORDER — AMOXICILLIN-POT CLAVULANATE 875-125 MG PO TABS: 1.0000 | ORAL_TABLET | Freq: Two times a day (BID) | ORAL | 0 refills | Status: DC

## 2019-06-07 NOTE — Progress Notes (Signed)
  Subjective:     Patient ID: Gerald Johnson, male   DOB: July 01, 1960, 59 y.o.   MRN: [id number]  HPI Gerald Johnson is seen with recurrent sinus symptoms.  He has had multiple episodes of presumed acute sinusitis in the past.  He states he had over 1 week history of bifrontal pressure and pain behind both eyes.  This is been typical of previous sinusitis episodes.  He is having some thick postnasal drainage.  No cough.  No fever.  He was referred to ENT back in November and referral was made but patient does not recall receiving any kind of phone call regarding that appointment.  He has increased fatigue symptoms.  No hoarseness.  Does have intermittent headaches.  No relief with over-the-counter medications.  He has responded promptly in the past the medication such as Augmentin.  He feels that his symptoms did fully resolve when he was treated in November.  He has tried saline irrigation with Nettie pot in the past without much success.  Past Medical History:  Diagnosis Date  . Acute prostatitis 12/02/2007  . ALLERGIC RHINITIS 02/26/2007  . Asthma   . 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 Headache behind the eyes 07/03/2010   New problem noted 27 2012   . Hemorrhoids   . HYPERLIPIDEMIA 02/26/2007  . Migraines   . Sciatica 05/04/2008  . SINUSITIS, RECURRENT 02/28/2010   Past Surgical History:  Procedure Laterality Date  . WISDOM TOOTH EXTRACTION      reports that he has never smoked. He has never used smokeless tobacco. He reports current alcohol use of about 7.0 standard drinks of alcohol per week. He reports that he does not use drugs. family history includes Diabetes in his brother. No Known Allergies   Review of Systems  Constitutional: Positive for fatigue. Negative for chills and fever.  HENT: Positive for congestion, sinus pressure and sinus pain. Negative for facial swelling and sore throat.   Respiratory: Negative for cough and shortness of breath.   Cardiovascular: Negative  for chest pain.  Neurological: Positive for headaches.       Objective:   Physical Exam Vitals reviewed.  Constitutional:      Appearance: Normal appearance.  Cardiovascular:     Rate and Rhythm: Normal rate and regular rhythm.  Pulmonary:     Effort: Pulmonary effort is normal.     Breath sounds: Normal breath sounds.  Musculoskeletal:     Cervical back: Neck supple.  Neurological:     Mental Status: He is alert.        Assessment:     Probable recurrent frontal sinusitis    Plan:     -Start back Augmentin 875 mg twice daily with food -Patient will call ENT office to see about referral status -We discussed saline irrigation with distilled saline solution  Eulas Post MD [facility]

## 2019-06-07 NOTE — Patient Instructions (Signed)
Sinusitis, Adult Sinusitis is inflammation of your sinuses. Sinuses are hollow spaces in the bones around your face. Your sinuses are located:  Around your eyes.  In the middle of your forehead.  Behind your nose.  In your cheekbones. Mucus normally drains out of your sinuses. When your nasal tissues become inflamed or swollen, mucus can become trapped or blocked. This allows bacteria, viruses, and fungi to grow, which leads to infection. Most infections of the sinuses are caused by a virus. Sinusitis can develop quickly. It can last for up to 4 weeks (acute) or for more than 12 weeks (chronic). Sinusitis often develops after a cold. What are the causes? This condition is caused by anything that creates swelling in the sinuses or stops mucus from draining. This includes:  Allergies.  Asthma.  Infection from bacteria or viruses.  Deformities or blockages in your nose or sinuses.  Abnormal growths in the nose (nasal polyps).  Pollutants, such as chemicals or irritants in the air.  Infection from fungi (rare). What increases the risk? You are more likely to develop this condition if you:  Have a weak body defense system (immune system).  Do a lot of swimming or diving.  Overuse nasal sprays.  Smoke. What are the signs or symptoms? The main symptoms of this condition are pain and a feeling of pressure around the affected sinuses. Other symptoms include:  Stuffy nose or congestion.  Thick drainage from your nose.  Swelling and warmth over the affected sinuses.  Headache.  Upper toothache.  A cough that may get worse at night.  Extra mucus that collects in the throat or the back of the nose (postnasal drip).  Decreased sense of smell and taste.  Fatigue.  A fever.  Sore throat.  Bad breath. How is this diagnosed? This condition is diagnosed based on:  Your symptoms.  Your medical history.  A physical exam.  Tests to find out if your condition is  acute or chronic. This may include: ? Checking your nose for nasal polyps. ? Viewing your sinuses using a device that has a light (endoscope). ? Testing for allergies or bacteria. ? Imaging tests, such as an MRI or CT scan. In rare cases, a bone biopsy may be done to rule out more serious types of fungal sinus disease. How is this treated? Treatment for sinusitis depends on the cause and whether your condition is chronic or acute.  If caused by a virus, your symptoms should go away on their own within 10 days. You may be given medicines to relieve symptoms. They include: ? Medicines that shrink swollen nasal passages (topical intranasal decongestants). ? Medicines that treat allergies (antihistamines). ? A spray that eases inflammation of the nostrils (topical intranasal corticosteroids). ? Rinses that help get rid of thick mucus in your nose (nasal saline washes).  If caused by bacteria, your health care provider may recommend waiting to see if your symptoms improve. Most bacterial infections will get better without antibiotic medicine. You may be given antibiotics if you have: ? A severe infection. ? A weak immune system.  If caused by narrow nasal passages or nasal polyps, you may need to have surgery. Follow these instructions at home: Medicines  Take, use, or apply over-the-counter and prescription medicines only as told by your health care provider. These may include nasal sprays.  If you were prescribed an antibiotic medicine, take it as told by your health care provider. Do not stop taking the antibiotic even if you start   to feel better. Hydrate and humidify   Drink enough fluid to keep your urine pale yellow. Staying hydrated will help to thin your mucus.  Use a cool mist humidifier to keep the humidity level in your home above 50%.  Inhale steam for 10-15 minutes, 3-4 times a day, or as told by your health care provider. You can do this in the bathroom while a hot shower is  running.  Limit your exposure to cool or dry air. Rest  Rest as much as possible.  Sleep with your head raised (elevated).  Make sure you get enough sleep each night. General instructions   Apply a warm, moist washcloth to your face 3-4 times a day or as told by your health care provider. This will help with discomfort.  Wash your hands often with soap and water to reduce your exposure to germs. If soap and water are not available, use hand sanitizer.  Do not smoke. Avoid being around people who are smoking (secondhand smoke).  Keep all follow-up visits as told by your health care provider. This is important. Contact a health care provider if:  You have a fever.  Your symptoms get worse.  Your symptoms do not improve within 10 days. Get help right away if:  You have a severe headache.  You have persistent vomiting.  You have severe pain or swelling around your face or eyes.  You have vision problems.  You develop confusion.  Your neck is stiff.  You have trouble breathing. Summary  Sinusitis is soreness and inflammation of your sinuses. Sinuses are hollow spaces in the bones around your face.  This condition is caused by nasal tissues that become inflamed or swollen. The swelling traps or blocks the flow of mucus. This allows bacteria, viruses, and fungi to grow, which leads to infection.  If you were prescribed an antibiotic medicine, take it as told by your health care provider. Do not stop taking the antibiotic even if you start to feel better.  Keep all follow-up visits as told by your health care provider. This is important. This information is not intended to replace advice given to you by your health care provider. Make sure you discuss any questions you have with your health care provider. Document Revised: 10/13/2017 Document Reviewed: 10/13/2017 Elsevier Patient Education  2020 Elsevier Inc.  

## 2019-06-24 ENCOUNTER — Ambulatory Visit (INDEPENDENT_AMBULATORY_CARE_PROVIDER_SITE_OTHER): Payer: 59 | Admitting: Otolaryngology

## 2019-06-24 ENCOUNTER — Other Ambulatory Visit: Payer: Self-pay

## 2019-06-24 ENCOUNTER — Encounter (INDEPENDENT_AMBULATORY_CARE_PROVIDER_SITE_OTHER): Payer: Self-pay | Admitting: Otolaryngology

## 2019-06-24 VITALS — Temp 97.5°F

## 2019-06-24 DIAGNOSIS — J310 Chronic rhinitis: Secondary | ICD-10-CM

## 2019-06-24 DIAGNOSIS — G44229 Chronic tension-type headache, not intractable: Secondary | ICD-10-CM

## 2019-06-24 NOTE — Progress Notes (Addendum)
HPI: Gerald Johnson is a 59 y.o. male who presents for evaluation of chronic headaches that he relates to his sinuses.  He describes headaches behind the eyes and over the head that he describes as "sinus headaches".  Denies any yellow-green discharge.  Does not notice a lot of trouble breathing through his nose.  He has used the Nasonex in the past but only uses this intermittently.  He does snore at night but denies any trouble breathing through his nose.  Mucus discharge from his nose has been clear. Of note he had a CT scan of the sinuses performed in 2011 that showed clear paranasal sinuses he had a moderate septal deviation with very minimal mucosal thickening within the sinuses.  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 When he has a headaches he takes Excedrin and that seems to help. He has just recently been treated with a course of Augmentin.  Past Medical History:  Diagnosis Date  . Acute prostatitis 12/02/2007  . ALLERGIC RHINITIS 02/26/2007  . Asthma   . 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 Headache behind the eyes 07/03/2010   New problem noted 27 2012   . Hemorrhoids   . HYPERLIPIDEMIA 02/26/2007  . Migraines   . Sciatica 05/04/2008  . SINUSITIS, RECURRENT 02/28/2010   Past Surgical History:  Procedure Laterality Date  . WISDOM TOOTH EXTRACTION     Social History   Socioeconomic History  . Marital status: Single    Spouse name: Not on file  . Number of children: Not on file  . Years of education: Not on file  . Highest education level: Not on file  Occupational History  . Not on file  Tobacco Use  . Smoking status: Never Smoker  . Smokeless tobacco: Never Used  Substance and Sexual Activity  . Alcohol use: Yes    Alcohol/week: 7.0 standard drinks    Types: 7 Cans of beer per week    Comment: daily  . Drug use: No  . Sexual activity: Yes    Birth control/protection: Condom  Other Topics Concern  . Not on file  Social History Narrative    Works as a Nature conservation officer.    Not married    No kids    He likes to go to the gym. Likes to go to go to restaurants.    Social Determinants of Health   Financial Resource Strain:   . Difficulty of Paying Living Expenses: Not on file  Food Insecurity:   . Worried About Charity fundraiser in the Last Year: Not on file  . Ran Out of Food in the Last Year: Not on file  Transportation Needs:   . Lack of Transportation (Medical): Not on file  . Lack of Transportation (Non-Medical): Not on file  Physical Activity:   . Days of Exercise per Week: Not on file  . Minutes of Exercise per Session: Not on file  Stress:   . Feeling of Stress : Not on file  Social Connections:   . Frequency of Communication with Friends and Family: Not on file  . Frequency of Social Gatherings with Friends and Family: Not on file  . Attends Religious Services: Not on file  . Active Member of Clubs or Organizations: Not on file  . Attends Archivist Meetings: Not on file  . Marital Status: Not on file   Family History  Problem Relation Age of Onset  . Diabetes Brother   . Colon  cancer Neg Hx   . Esophageal cancer Neg Hx   . Rectal cancer Neg Hx   . Stomach cancer Neg Hx   . Gout Neg Hx    No Known Allergies Prior to Admission medications   Medication Sig Start Date End Date Taking? Authorizing Provider  amoxicillin-clavulanate (AUGMENTIN) 875-125 MG tablet Take 1 tablet by mouth 2 (two) times daily. 04/20/19  Yes Nafziger, Tommi Rumps, NP  amoxicillin-clavulanate (AUGMENTIN) 875-125 MG tablet Take 1 tablet by mouth 2 (two) times daily. 06/07/19  Yes Burchette, Alinda Sierras, MD  Aspirin-Acetaminophen-Caffeine (EXCEDRIN PO) Take by mouth as needed.   Yes [provider]  mometasone (NASONEX) 50 MCG/ACT nasal spray Place 2 sprays into the nose daily. 03/04/19  Yes Nafziger, Tommi Rumps, NP  sildenafil (VIAGRA) 50 MG tablet Take 1 tablet (50 mg total) by mouth daily as needed for erectile dysfunction (take  1/2 to 1 pill as needed). 03/18/18  Yes Nafziger, Tommi Rumps, NP  SUMAtriptan (IMITREX) 50 MG tablet TAKE 1 TABLET BY MOUTH EVERY 2 HOURS AS NEEDED FOR MIGRAINE.MAY REPEAT IN 2 HOURS IF HEADACHE PERSISTS OR RECURS. 12/10/18  Yes Nafziger, Tommi Rumps, NP  amitriptyline (ELAVIL) 50 MG tablet Take 1 tablet (50 mg total) by mouth at bedtime. 12/10/18 03/10/19  Nafziger, Tommi Rumps, NP     Positive ROS: Otherwise negative  All other systems have been reviewed and were otherwise negative with the exception of those mentioned in the HPI and as above.  Physical Exam: Constitutional: Alert, well-appearing, no acute distress Ears: External ears without lesions or tenderness. Ear canals are clear bilaterally with intact, clear TMs.  Nasal: External nose without lesions. Septum mild to moderate deviation..  Moderate rhinitis with mucosal swelling but only clear mucus discharge noted.  Nasal endoscopy was performed bilaterally and on nasal endoscopy both middle meatus regions were clear with no obvious mucopurulent discharge noted.  He did have moderate edema especially posteriorly but again no gross mucopurulent discharge noted no polyps noted. Oral: Lips and gums without lesions. Tongue and palate mucosa without lesions. Posterior oropharynx clear. Neck: No palpable adenopathy or masses Respiratory: Breathing comfortably  Skin: No facial/neck lesions or rash noted.  Nasal/sinus endoscopy  Date/Time: 06/24/2019 3:02 PM Performed by: Rozetta Nunnery, MD Authorized by: Rozetta Nunnery, MD   Consent:    Consent obtained:  Verbal   Consent given by:  Patient   Risks discussed:  Pain Procedure details:    Indications: sino-nasal symptoms     Instrument: flexible fiberoptic nasal endoscope     Scope location: bilateral nare   Septum:    Deviation: deviated to the right     Severity of deviation: intermediate   Sinus:    Right middle meatus: normal     Left middle meatus: normal     Right nasopharynx:  normal     Left nasopharynx: normal   Comments:     On nasal endoscopy he had moderate mucosal edema but only clear mucus discharge noted from the middle meatus.  He had moderate more edema in the posterior nasal cavity but again did not identify any obvious mucopurulent discharge.    Assessment: Chronic rhinitis.  Presently no evidence of active sinus infection on clinical exam. Headaches  Plan: Recommended regular use of nasal steroid spray either Nasacort or Nasonex 2 sprays each nostril at night for the next 4 weeks.  If headache problems persist following 4 weeks of nasal steroid spray he will call us back to schedule CT scan of the sinuses.  Radene Journey, MD

## 2019-08-17 ENCOUNTER — Ambulatory Visit: Payer: Self-pay | Admitting: Family Medicine

## 2019-08-17 DIAGNOSIS — Z0289 Encounter for other administrative examinations: Secondary | ICD-10-CM

## 2019-09-27 ENCOUNTER — Telehealth (INDEPENDENT_AMBULATORY_CARE_PROVIDER_SITE_OTHER): Payer: 59 | Admitting: Family Medicine

## 2019-09-27 ENCOUNTER — Other Ambulatory Visit: Payer: Self-pay

## 2019-09-27 ENCOUNTER — Encounter: Payer: Self-pay | Admitting: Family Medicine

## 2019-09-27 DIAGNOSIS — J310 Chronic rhinitis: Secondary | ICD-10-CM

## 2019-09-27 DIAGNOSIS — R0602 Shortness of breath: Secondary | ICD-10-CM

## 2019-09-27 MED ORDER — CETIRIZINE HCL 10 MG PO TABS: 10.0000 mg | ORAL_TABLET | Freq: Every day | ORAL | 1 refills | Status: DC

## 2019-09-27 MED ORDER — PREDNISONE 20 MG PO TABS: 40.0000 mg | ORAL_TABLET | Freq: Every day | ORAL | 0 refills | Status: AC

## 2019-09-27 NOTE — Progress Notes (Signed)
Virtual Visit via Telephone Note  I connected with Gerald Johnson on 09/27/19 at  3:30 PM EDT by telephone and verified that I am speaking with the correct person using two identifiers.   I discussed the limitations, risks, security and privacy concerns of performing an evaluation and management service by telephone and the availability of in person appointments. I also discussed with the patient that there may be a patient responsible charge related to this service. The patient expressed understanding and agreed to proceed.  Location patient: home Location provider: work office Participants present for the call: patient, provider Patient did not have a visit in the prior 7 days to address this/these issue(s).  Chief Complaint  Patient presents with  . Pain behind eyes    started 2 days ago  . Drainage in throat   History of Present Illness: Gerald Johnson is a 59 years old male with history of headaches and allergies he is complaining about retro-ocular pressure-like headache, postnasal drainage, and rhinorrhea.  He thinks he may have a sinus infection. He has had similar symptoms intermittently for a while now. He has not identified exacerbating or alleviating factors.  He denies fever, chills, visual changes, sore throat, wheezing, nausea, vomiting, focal deficit, or a skin rash. He mentions some shortness of breath 4 days ago, "not bad", he felt like his breathing was " not normal."  Nonproductive cough, "not a lot." Negative for CP or diaphoresis.  Negative for sick contact or recent travel. No anosmia or ageusia.  He has taking Flonase nasal spray and Sudafed.  Reviewing his records I noticed that he has been treated for a few times for bacteria sinusitis, last in 05/2019.He reports some improvement but symptoms do not resolve.  He was evaluated by ENT on 06/24/19, determined that symptoms were caused by chronic rhinitis and tension-like headache. Nasonex was recommended.  He was  supposed to call ENTs office to arrange a sinus CT, he has not done so.   Observations/Objective: Patient sounds cheerful and well on the phone. I do not appreciate any SOB.No cough. Mild nasal voice,no stridor. Speech and thought processing are grossly intact. Patient reported vitals:N/A  Assessment and Plan: 1. Chronic rhinitis He agrees with trying short course of prednisone. Prednisone 40 mg daily with breakfast x3 days. We discussed some side effects, instructed to take it with breakfast. Nasal saline irrigations as needed. Continue Flonase nasal spray. Zyrtec 10 mg daily in the morning.  Instructed to call and arrange sinus CT as recommended.  - predniSONE (DELTASONE) 20 MG tablet; Take 2 tablets (40 mg total) by mouth daily with breakfast for 3 days.  Dispense: 6 tablet; Refill: 0  2. Shortness of breath ?Related to nasal congestion. During phone visit there are no signs of respiratory distress. So I do not think he needs to go to the ER at this time but he was instructed about warning signs. Follow-up with PCP in 2 weeks, before if needed.  Follow Up Instructions:  Return in about 2 weeks (around 10/11/2019) for Rhinitis with PCP.  I did not refer this patient for an OV in the next 24 hours for this/these issue(s).  I discussed the assessment and treatment plan with the patient. Gerald Johnson was provided an opportunity to ask questions and all were answered. The patient agreed with the plan and demonstrated an understanding of the instructions.   I provided 12-13 minutes of non-face-to-face time during this encounter.   Gerald Biel [place], MD

## 2019-12-08 ENCOUNTER — Telehealth: Payer: Self-pay | Admitting: Adult Health

## 2019-12-08 ENCOUNTER — Other Ambulatory Visit: Payer: Self-pay | Admitting: Adult Health

## 2019-12-08 MED ORDER — PREDNISONE 10 MG PO TABS: 10.0000 mg | ORAL_TABLET | Freq: Every day | ORAL | 0 refills | Status: DC

## 2019-12-08 NOTE — Telephone Encounter (Signed)
Pt stated he has gout again and wonders if a px can be sent in? He stated his PCP knows his history with gout  Pharmacy:  [id number] [address], [place] [facility] [place] Phone:  [phone]  Fax:  [phone]

## 2019-12-24 ENCOUNTER — Telehealth: Payer: Self-pay | Admitting: Adult Health

## 2019-12-28 ENCOUNTER — Telehealth (INDEPENDENT_AMBULATORY_CARE_PROVIDER_SITE_OTHER): Payer: 59 | Admitting: Adult Health

## 2019-12-28 ENCOUNTER — Other Ambulatory Visit: Payer: Self-pay

## 2019-12-28 ENCOUNTER — Encounter: Payer: Self-pay | Admitting: Adult Health

## 2019-12-28 VITALS — Wt 248.0 lb

## 2019-12-28 DIAGNOSIS — N529 Male erectile dysfunction, unspecified: Secondary | ICD-10-CM

## 2019-12-28 DIAGNOSIS — M79671 Pain in right foot: Secondary | ICD-10-CM

## 2019-12-28 MED ORDER — SILDENAFIL CITRATE 50 MG PO TABS: 50.0000 mg | ORAL_TABLET | Freq: Every day | ORAL | 6 refills | Status: DC | PRN

## 2019-12-28 NOTE — Progress Notes (Signed)
Virtual Visit via Telephone Note  I connected with Gerald Johnson on 12/28/19 at 10:30 AM EDT by telephone and verified that I am speaking with the correct person using two identifiers.   I discussed the limitations, risks, security and privacy concerns of performing an evaluation and management service by telephone and the availability of in person appointments. I also discussed with the patient that there may be a patient responsible charge related to this service. The patient expressed understanding and agreed to proceed.  Location patient: home Location provider: work or home office Participants present for the call: patient, provider Patient did not have a visit in the prior 7 days to address this/these issue(s).   History of Present Illness: 59 year old male who is being evaluated today for an acute issue of right foot pain.  Reports he has had a pretty consistent pain on the bottom of his right foot for the last 2 weeks.  Pain is worse with walking if he is on his feet a lot at work then he will have some discomfort while at rest.  Reports that he has been working longer hours and is continuously on his feet for about 12 hours a day.  Reports that he has been using Aleve over-the-counter and this helps relieve some of his pain.  He denies redness, or warmth but does have some mild swelling noted to his right foot  He also needs a refill of Viagra    Observations/Objective: Patient sounds cheerful and well on the phone. I do not appreciate any SOB. Speech and thought processing are grossly intact. Patient reported vitals:  Assessment and Plan: 1. Right foot pain -Likely overuse injury.  Advised to try getting a pair of good insoles.  Ice the bottom of his foot and can tinea with anti-inflammatories.  If no improvement over the next week then follow-up in the office  2. Erectile dysfunction, unspecified erectile dysfunction type  - sildenafil (VIAGRA) 50 MG tablet; Take 1 tablet  (50 mg total) by mouth daily as needed for erectile dysfunction (take 1/2 to 1 pill as needed).  Dispense: 10 tablet; Refill: 6   Follow Up Instructions:  I did not refer this patient for an OV in the next 24 hours for this/these issue(s).  I discussed the assessment and treatment plan with the patient. The patient was provided an opportunity to ask questions and all were answered. The patient agreed with the plan and demonstrated an understanding of the instructions.   The patient was advised to call back or seek an in-person evaluation if the symptoms worsen or if the condition fails to improve as anticipated.  I provided 12 minutes of non-face-to-face time during this encounter.   [name], NP

## 2019-12-31 NOTE — Telephone Encounter (Signed)
error 

## 2020-02-25 ENCOUNTER — Encounter: Payer: Self-pay | Admitting: Internal Medicine

## 2020-02-25 ENCOUNTER — Telehealth (INDEPENDENT_AMBULATORY_CARE_PROVIDER_SITE_OTHER): Payer: 59 | Admitting: Internal Medicine

## 2020-02-25 ENCOUNTER — Other Ambulatory Visit: Payer: Self-pay

## 2020-02-25 ENCOUNTER — Telehealth: Payer: Self-pay | Admitting: Adult Health

## 2020-02-25 VITALS — Wt 250.0 lb

## 2020-02-25 DIAGNOSIS — J069 Acute upper respiratory infection, unspecified: Secondary | ICD-10-CM

## 2020-02-25 NOTE — Progress Notes (Signed)
Virtual Visit via Telephone Note  I connected with Gerald Johnson on 02/25/20 at  3:45 PM EDT by telephone and verified that I am speaking with the correct person using two identifiers.   I discussed the limitations, risks, security and privacy concerns of performing an evaluation and management service by telephone and the availability of in person appointments. I also discussed with the patient that there may be a patient responsible charge related to this service. The patient expressed understanding and agreed to proceed.  Location patient: home Location provider: work office Participants present for the call: patient, provider Patient did not have a visit in the prior 7 days to address this/these issue(s).   History of Present Illness:  Gerald Johnson started having pain behind his left eye 2 days ago.  He has been having some nasal congestion but no runny nose, no headache, no fever, no vision changes, no other URI symptoms.  He has used his Nettie pot several times and has gotten some clear secretions.  He is fully vaccinated against Covid, completed vaccine series in March.  As far as he is aware of, no sick contacts, no recent travel.  He has been taking Excedrin for 2 days without significant relief.  He states this pain is similar as to what he has had before with a sinus infection.   Observations/Objective: Patient sounds cheerful and well on the phone. I do not appreciate any increased work of breathing. Speech and thought processing are grossly intact. Patient reported vitals: None reported   Current Outpatient Medications:  .  amitriptyline (ELAVIL) 50 MG tablet, Take 1 tablet (50 mg total) by mouth at bedtime., Disp: 90 tablet, Rfl: 0 .  Aspirin-Acetaminophen-Caffeine (EXCEDRIN PO), Take by mouth as needed., Disp: , Rfl:  .  RESTASIS 0.05 % ophthalmic emulsion, 1 drop 2 (two) times daily., Disp: , Rfl:  .  sildenafil (VIAGRA) 50 MG tablet, Take 1 tablet (50 mg total) by  mouth daily as needed for erectile dysfunction (take 1/2 to 1 pill as needed)., Disp: 10 tablet, Rfl: 6 .  SUMAtriptan (IMITREX) 50 MG tablet, TAKE 1 TABLET BY MOUTH EVERY 2 HOURS AS NEEDED FOR MIGRAINE.MAY REPEAT IN 2 HOURS IF HEADACHE PERSISTS OR RECURS., Disp: 10 tablet, Rfl: 2 .  cetirizine (ZYRTEC) 10 MG tablet, Take 1 tablet (10 mg total) by mouth daily. (Patient not taking: Reported on 02/25/2020), Disp: 90 tablet, Rfl: 1  Review of Systems:  Constitutional: Denies fever, chills, diaphoresis, appetite change and fatigue.  HEENT: Denies photophobia, redness, hearing loss, ear pain, congestion, sore throat, rhinorrhea, sneezing, mouth sores, trouble swallowing, neck pain, neck stiffness and tinnitus.   Respiratory: Denies SOB, DOE, cough, chest tightness,  and wheezing.   Cardiovascular: Denies chest pain, palpitations and leg swelling.  Gastrointestinal: Denies nausea, vomiting, abdominal pain, diarrhea, constipation, blood in stool and abdominal distention.  Genitourinary: Denies dysuria, urgency, frequency, hematuria, flank pain and difficulty urinating.  Endocrine: Denies: hot or cold intolerance, sweats, changes in hair or nails, polyuria, polydipsia. Musculoskeletal: Denies myalgias, back pain, joint swelling, arthralgias and gait problem.  Skin: Denies pallor, rash and wound.  Neurological: Denies dizziness, seizures, syncope, weakness, light-headedness, numbness and headaches.  Hematological: Denies adenopathy. Easy bruising, personal or family bleeding history  Psychiatric/Behavioral: Denies suicidal ideation, mood changes, confusion, nervousness, sleep disturbance and agitation   Assessment and Plan:  Upper respiratory tract infection, unspecified type --Given presentation, PNA, pharyngitis, ear infection are not likely, hence abx have not been prescribed. -Have  advised rest, fluids, OTC antihistamines, cough suppressants, pain relievers and mucinex. -RTC if no improvement in  10-14 days.    I discussed the assessment and treatment plan with the patient. The patient was provided an opportunity to ask questions and all were answered. The patient agreed with the plan and demonstrated an understanding of the instructions.   The patient was advised to call back or seek an in-person evaluation if the symptoms worsen or if the condition fails to improve as anticipated.  I provided 13 minutes of non-face-to-face time during this encounter.   Lelon Frohlich, MD [facility]

## 2020-02-25 NOTE — Telephone Encounter (Signed)
Patient would like something called in for a sinus infection.   [id number] - [place], [place] - [address] AT [place] Phone:  [phone]  Fax:  [phone]

## 2020-02-25 NOTE — Telephone Encounter (Signed)
I am having issues with scheduling.   Can you let patient know that they will have to have at the very least a virtual visit? If they refuse, they will need to be seen at an urgent care.

## 2020-02-25 NOTE — Telephone Encounter (Signed)
I have the patient scheduled for a telephone visit this afternoon at 3:45 with Dr. Jerilee Hoh

## 2020-02-29 ENCOUNTER — Encounter: Payer: Self-pay | Admitting: Adult Health

## 2020-02-29 ENCOUNTER — Telehealth (INDEPENDENT_AMBULATORY_CARE_PROVIDER_SITE_OTHER): Payer: 59 | Admitting: Adult Health

## 2020-02-29 ENCOUNTER — Other Ambulatory Visit: Payer: Self-pay

## 2020-02-29 VITALS — Wt 250.0 lb

## 2020-02-29 DIAGNOSIS — R519 Headache, unspecified: Secondary | ICD-10-CM

## 2020-02-29 DIAGNOSIS — F430 Acute stress reaction: Secondary | ICD-10-CM

## 2020-02-29 MED ORDER — SUMATRIPTAN SUCCINATE 50 MG PO TABS: ORAL_TABLET | ORAL | 6 refills | Status: DC

## 2020-02-29 NOTE — Progress Notes (Signed)
Virtual Visit via Telephone Note  I connected with Gerald Johnson on 02/29/20 at 11:00 AM EDT by telephone and verified that I am speaking with the correct person using two identifiers.   I discussed the limitations, risks, security and privacy concerns of performing an evaluation and management service by telephone and the availability of in person appointments. I also discussed with the patient that there may be a patient responsible charge related to this service. The patient expressed understanding and agreed to proceed.  Location patient: home Location provider: work or home office Participants present for the call: patient, provider Patient did not have a visit in the prior 7 days to address this/these issue(s).   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  Is being evaluated today for 2 separate issues.  He was seen 4 days ago by another provider for pain behind his left eye that started 2 days prior.  He has been having some nasal congestion but no runny nose, no fever no vision changes or other URI-like symptoms.  He has been using Flonase, various over-the-counter cold and flu medications, and Excedrin without any relief.  He denies photo or phonophobia but does have some left eye watery drainage.  No conjunctivitis.  Additionally, reports feeling very stressed out due to work duties and also having to take care of his mother.  Asked about anxiety and/or depression he will feel anxious at times does not feel depressed   Observations/Objective: Patient sounds cheerful and well on the phone. I do not appreciate any SOB. Speech and thought processing are grossly intact. Patient  reported vitals:  Assessment and Plan: 1. Acute intractable headache, unspecified headache type -Advised that it could be sinus related but it is too early to tell between bacterial and viral, also has some symptoms of migraine headaches.  We will send in refill of his Imitrex.  He was advised to follow-up in 3 to 4 days if no improvement and at that time we could rule out viral sinusitis and treat with ABX - SUMAtriptan (IMITREX) 50 MG tablet; TAKE 1 TABLET BY MOUTH EVERY 2 HOURS AS NEEDED FOR MIGRAINE.MAY REPEAT IN 2 HOURS IF HEADACHE PERSISTS OR RECURS.  Dispense: 10 tablet; Refill: 6  2. Stress reaction -We discussed various options to help with stress reduction including exercise, medication, and therapy.  He is not keen on starting medication at this time nor seeing a therapist.  He will follow-up if his symptoms get worse   Follow Up Instructions:  I did not refer this patient for an OV in the next 24 hours for this/these issue(s).  I discussed the assessment and treatment plan with the patient. The patient was provided an opportunity to ask questions and all were answered. The patient agreed with the plan and demonstrated an understanding of the instructions.   The patient was advised to call back or seek an in-person evaluation if the symptoms worsen or if the condition fails to improve as anticipated.  I provided 11 minutes of non-face-to-face time during this encounter.   [name], NP

## 2020-04-24 ENCOUNTER — Ambulatory Visit: Payer: 59 | Admitting: Family Medicine

## 2020-04-24 ENCOUNTER — Encounter: Payer: Self-pay | Admitting: Adult Health

## 2020-04-24 ENCOUNTER — Encounter: Payer: Self-pay | Admitting: Family Medicine

## 2020-04-24 ENCOUNTER — Other Ambulatory Visit: Payer: Self-pay

## 2020-04-24 VITALS — BP 120/90 | HR 98 | Resp 16 | Ht 73.75 in | Wt 252.0 lb

## 2020-04-24 DIAGNOSIS — M545 Low back pain, unspecified: Secondary | ICD-10-CM

## 2020-04-24 DIAGNOSIS — M25541 Pain in joints of right hand: Secondary | ICD-10-CM | POA: Diagnosis not present

## 2020-04-24 DIAGNOSIS — R519 Headache, unspecified: Secondary | ICD-10-CM | POA: Diagnosis not present

## 2020-04-24 DIAGNOSIS — J302 Other seasonal allergic rhinitis: Secondary | ICD-10-CM | POA: Diagnosis not present

## 2020-04-24 MED ORDER — TIZANIDINE HCL 4 MG PO TABS: 2.0000 mg | ORAL_TABLET | Freq: Three times a day (TID) | ORAL | 0 refills | Status: AC | PRN

## 2020-04-24 MED ORDER — PREDNISONE 20 MG PO TABS: 40.0000 mg | ORAL_TABLET | Freq: Every day | ORAL | 0 refills | Status: AC

## 2020-04-24 NOTE — Progress Notes (Signed)
ACUTE VISIT Chief Complaint  Patient presents with  . Gout   HPI: [name] is a 59 y.o. male with hx of  Migraine,allergies,and HLD here today with above complaint.  Started with right 4th finger pain on 04/18/20, sudden onset. Achy pain, 8/10, PIP joint. No hx of trauma. + Edema, no erythema.  He has hx of gout and usually in toes and fingers. He has taken Advil. Right handed.  He is also c/o a week of lower back pain, middle. No radiated. Negative for numbness,tingling,burning. No saddle anesthesia or bowel/bladder dysfunction,  He thinks he may have back pain before.  He does a lot of lifting at work,he thinks this has been a contributing factor.  Nasal congestion since last night. Rhinorrhea and retro-ocular headache. Headache now "is not bad."  He has hx of migraines. Headache is similar to his typical migraine , except for associated respiratory systems.  Eye pruritus and epiphora.  Took Excedrin a week ago. No associated nausea, vomiting,photophobia,or phonophobia.  Negative for fever,chills,changes in appetite,anosmia, and ageusia.  Allergic rhinitis: Today he started Flonase nasal spray. Tried neti pot. Negative for fever,chills,changes in appetite,and sore throat.  Review of Systems  Constitutional: Negative for activity change.  HENT: Negative for mouth sores, nosebleeds and sinus pain.   Eyes: Negative for redness and visual disturbance.  Respiratory: Negative for cough, shortness of breath and wheezing.   Cardiovascular: Negative for chest pain, palpitations and leg swelling.  Gastrointestinal: Negative for abdominal pain.  Genitourinary: Negative for decreased urine volume and hematuria.  Allergic/Immunologic: Positive for environmental allergies.  Neurological: Negative for syncope and weakness.  Rest see pertinent positives and negatives per HPI.  Current Outpatient Medications on File Prior to Visit  Medication Sig Dispense  Refill  . Aspirin-Acetaminophen-Caffeine (EXCEDRIN PO) Take by mouth as needed.    . RESTASIS 0.05 % ophthalmic emulsion 1 drop 2 (two) times daily.    . sildenafil (VIAGRA) 50 MG tablet Take 1 tablet (50 mg total) by mouth daily as needed for erectile dysfunction (take 1/2 to 1 pill as needed). 10 tablet 6  . SUMAtriptan (IMITREX) 50 MG tablet TAKE 1 TABLET BY MOUTH EVERY 2 HOURS AS NEEDED FOR MIGRAINE.MAY REPEAT IN 2 HOURS IF HEADACHE PERSISTS OR RECURS. 10 tablet 6  . amitriptyline (ELAVIL) 50 MG tablet Take 1 tablet (50 mg total) by mouth at bedtime. 90 tablet 0   No current facility-administered medications on file prior to visit.   Past Medical History:  Diagnosis Date  . Acute prostatitis 12/02/2007  . ALLERGIC RHINITIS 02/26/2007  . Asthma   . 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 Headache behind the eyes 07/03/2010   New problem noted 27 2012   . Hemorrhoids   . HYPERLIPIDEMIA 02/26/2007  . Migraines   . Sciatica 05/04/2008  . SINUSITIS, RECURRENT 02/28/2010   No Known Allergies  Social History   Socioeconomic History  . Marital status: Single    Spouse name: Not on file  . Number of children: Not on file  . Years of education: Not on file  . Highest education level: Not on file  Occupational History  . Not on file  Tobacco Use  . Smoking status: Never Smoker  . Smokeless tobacco: Never Used  Substance and Sexual Activity  . Alcohol use: Yes    Alcohol/week: 7.0 standard drinks    Types: 7 Cans of beer per week    Comment: daily  . Drug use: No  .  Sexual activity: Yes    Birth control/protection: Condom  Other Topics Concern  . Not on file  Social History Narrative   Works as a Nature conservation officer.    Not married    No kids    He likes to go to the gym. Likes to go to go to restaurants.    Social Determinants of Health   Financial Resource Strain:   . Difficulty of Paying Living Expenses: Not on file  Food Insecurity:   . Worried About Paediatric nurse in the Last Year: Not on file  . Ran Out of Food in the Last Year: Not on file  Transportation Needs:   . Lack of Transportation (Medical): Not on file  . Lack of Transportation (Non-Medical): Not on file  Physical Activity:   . Days of Exercise per Week: Not on file  . Minutes of Exercise per Session: Not on file  Stress:   . Feeling of Stress : Not on file  Social Connections:   . Frequency of Communication with Friends and Family: Not on file  . Frequency of Social Gatherings with Friends and Family: Not on file  . Attends Religious Services: Not on file  . Active Member of Clubs or Organizations: Not on file  . Attends Archivist Meetings: Not on file  . Marital Status: Not on file    Vitals:   04/24/20 1436  BP: 120/90  Pulse: 98  Resp: 16  SpO2: 96%   Body mass index is 32.57 kg/m.  Physical Exam Vitals and nursing note reviewed.  Constitutional:      General: He is not in acute distress.    Appearance: He is well-developed. He is not ill-appearing.  HENT:     Head: Normocephalic and atraumatic.     Right Ear: Tympanic membrane, ear canal and external ear normal.     Left Ear: Tympanic membrane, ear canal and external ear normal.     Nose: Congestion and rhinorrhea present.     Right Turbinates: Enlarged.     Left Turbinates: Enlarged.     Right Sinus: No maxillary sinus tenderness or frontal sinus tenderness.     Left Sinus: No maxillary sinus tenderness or frontal sinus tenderness.     Comments: Nasal voice.    Mouth/Throat:     Mouth: Mucous membranes are moist.     Pharynx: Oropharynx is clear. No posterior oropharyngeal erythema.  Eyes:     Conjunctiva/sclera:     Right eye: Right conjunctiva is injected. No exudate.    Left eye: Left conjunctiva is injected. No exudate.    Comments: Bilateral epiphora.  Cardiovascular:     Rate and Rhythm: Normal rate and regular rhythm.     Heart sounds: No murmur heard.   Pulmonary:      Effort: Pulmonary effort is normal. No respiratory distress.     Breath sounds: Normal breath sounds. No stridor.  Musculoskeletal:     Lumbar back: No tenderness or bony tenderness. Negative right straight leg raise test and negative left straight leg raise test.     Comments: Right 4th finger: Edema of PIP joint, no erythema or local heat. Mildly limited flexion due to edema. No tophi appreciated.  Lymphadenopathy:     Head:     Right side of head: No submandibular adenopathy.     Left side of head: No submandibular adenopathy.     Cervical: No cervical adenopathy.  Skin:    General: Skin is  warm.     Findings: No erythema or rash.  Neurological:     Mental Status: He is alert and oriented to person, place, and time.     Cranial Nerves: No cranial nerve deficit.     Gait: Gait normal.     Comments: Patellar DTR's 1+-2+ symmetric,bilateral.    ASSESSMENT AND PLAN:  Ms. Christiano was seen today for gout.  Diagnoses and all orders for this visit:  Arthralgia of hand, right I am not certain this is gout related. Prednisone 40 mg daily with breakfast.  -     predniSONE (DELTASONE) 20 MG tablet; Take 2 tablets (40 mg total) by mouth daily with breakfast for 5 days.  Seasonal allergic rhinitis, unspecified trigger Acute exacerbation. Side effects of prednisone discussed. Nasal saline irrigations. Continue Flonase nasal spray daily as needed.  -     predniSONE (DELTASONE) 20 MG tablet; Take 2 tablets (40 mg total) by mouth daily with breakfast for 5 days.  Midline low back pain without sciatica, unspecified chronicity Chronic. Icy hot patch may help. Aleve 220 mg bid prn and Tylenol 500 mg 3-4 times daily prn. I do not think imaging is needed today.  -     tiZANidine (ZANAFLEX) 4 MG tablet; Take 0.5-1 tablets (2-4 mg total) by mouth every 8 (eight) hours as needed for up to 10 days for muscle spasms.  Headache, unspecified headache type Hx and examination today do not  suggest a serious problem. ? Allergies,tendion like headache. I do not think imaging is needed at this time.   Return in about 10 days (around 05/04/2020) for PCP.   Alexxus Sobh G. Martinique, MD  [facility]. [place] office.   A few things to remember from today's visit:   Seasonal allergic rhinitis, unspecified trigger  Midline low back pain without sciatica, unspecified chronicity  Arthralgia of hand, right  Take Prednisone with breakfast and do not take with Advil or aleve at the same time. Muscle relaxants cause drawsiness, so take it at bedtime if you need to engage in activities that require attention and increase risk of injury. Nasal saline irrigations. Monitor for fever.  I am not sure if finger pain is gout but Prednisone will help if it is. Monitor blood pressure if possible, bottom number mildly elevated.  Follow with PCP in 7-10 days if still not better.  Please be sure medication list is accurate. If a new problem present, please set up appointment sooner than planned today.

## 2020-04-24 NOTE — Patient Instructions (Addendum)
A few things to remember from today's visit:   Seasonal allergic rhinitis, unspecified trigger  Midline low back pain without sciatica, unspecified chronicity  Arthralgia of hand, right  Take Prednisone with breakfast and do not take with Advil or aleve at the same time. Muscle relaxants cause drawsiness, so take it at bedtime if you need to engage in activities that require attention and increase risk of injury. Nasal saline irrigations. Monitor for fever.  I am not sure if finger pain is gout but Prednisone will help if it is. Monitor blood pressure if possible, bottom number mildly elevated.  Follow with PCP in 7-10 days if still not better.  Please be sure medication list is accurate. If a new problem present, please set up appointment sooner than planned today.

## 2020-04-28 ENCOUNTER — Telehealth: Payer: Self-pay | Admitting: Adult Health

## 2020-04-28 NOTE — Telephone Encounter (Signed)
Patient is still having bad back pain and is wanting to know if Tommi Rumps can refer him to a back specialist

## 2020-04-28 NOTE — Telephone Encounter (Signed)
Spoke with the pt to let him know Tommi Rumps is out of the office today and will address his message on Tuesday.  Patient stated he has never seen [facility] for his back and was seen by Dr Martinique last week.  I advised the pt he may need an appt first prior to the referral.  Appt scheduled for 12/13 per pts preference.

## 2020-05-01 ENCOUNTER — Other Ambulatory Visit: Payer: Self-pay | Admitting: Family Medicine

## 2020-05-01 DIAGNOSIS — M545 Low back pain, unspecified: Secondary | ICD-10-CM

## 2020-05-02 NOTE — Telephone Encounter (Signed)
Perfectly fine, I want to see him first   Thank  you

## 2020-05-02 NOTE — Telephone Encounter (Signed)
Noted  

## 2020-05-08 ENCOUNTER — Ambulatory Visit: Payer: 59 | Admitting: Adult Health

## 2020-05-08 ENCOUNTER — Encounter: Payer: Self-pay | Admitting: Adult Health

## 2020-05-08 ENCOUNTER — Ambulatory Visit (INDEPENDENT_AMBULATORY_CARE_PROVIDER_SITE_OTHER): Payer: 59

## 2020-05-08 ENCOUNTER — Other Ambulatory Visit: Payer: Self-pay

## 2020-05-08 VITALS — BP 122/70 | HR 77 | Temp 97.9°F | Ht 74.0 in | Wt 253.0 lb

## 2020-05-08 DIAGNOSIS — M545 Low back pain, unspecified: Secondary | ICD-10-CM | POA: Diagnosis not present

## 2020-05-08 NOTE — Progress Notes (Signed)
Subjective:    Patient ID: Gerald Johnson, male    DOB: Apr 18, 1961, 59 y.o.   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  He presents to the office today for follow up regarding midline low back pain without sciatica. Pain is more present at work, he stands a lot at work and does a lot of heavy lifting. He is working 12 hour shifts. He was seen by another provider about two weeks ago and was prescribed prednisone and muscle relaxer. He reports that he did not take the muscle relaxer because it made him feel sleepy. When he is at home he does not have much pain.   BP Readings from Last 3 Encounters:  05/08/20 122/70  04/24/20 120/90  06/07/19 118/84    Review of Systems See HPI   Past Medical History:  Diagnosis Date  . Acute prostatitis 12/02/2007  . ALLERGIC RHINITIS 02/26/2007  . Asthma   . 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 Headache behind the eyes 07/03/2010   New problem noted 27 2012   . Hemorrhoids   . HYPERLIPIDEMIA 02/26/2007  . Migraines   . Sciatica 05/04/2008  . SINUSITIS, RECURRENT 02/28/2010    Social History   Socioeconomic History  . Marital status: Single    Spouse name: Not on file  . Number of children: Not on file  . Years of education: Not on file  . Highest education level: Not on file  Occupational History  . Not on file  Tobacco Use  . Smoking status: Never Smoker  . Smokeless tobacco: Never Used  Substance and Sexual Activity  . Alcohol use: Yes    Alcohol/week: 7.0 standard drinks    Types: 7 Cans of beer per week    Comment: daily  . Drug use: No  . Sexual activity: Yes    Birth control/protection: Condom  Other Topics Concern   . Not on file  Social History Narrative   Works as a Nature conservation officer.    Not married    No kids    He likes to go to the gym. Likes to go to go to restaurants.    Social Determinants of Health   Financial Resource Strain: Not on file  Food Insecurity: Not on file  Transportation Needs: Not on file  Physical Activity: Not on file  Stress: Not on file  Social Connections: Not on file  Intimate Partner Violence: Not on file    Past Surgical History:  Procedure Laterality Date  . WISDOM TOOTH EXTRACTION      Family History  Problem Relation Age of Onset  . Diabetes Brother   . Colon cancer Neg Hx   . Esophageal cancer Neg Hx   . Rectal cancer Neg Hx   . Stomach cancer Neg Hx   . Gout Neg Hx     No Known Allergies  Current Outpatient Medications on File Prior to Visit  Medication Sig Dispense Refill  . Aspirin-Acetaminophen-Caffeine (EXCEDRIN PO) Take by mouth as needed.    . RESTASIS 0.05 % ophthalmic emulsion 1 drop 2 (two) times daily.    . sildenafil (VIAGRA) 50 MG tablet Take 1 tablet (50 mg total) by mouth daily as needed for erectile dysfunction (take 1/2 to 1 pill as needed). 10 tablet  6  . SUMAtriptan (IMITREX) 50 MG tablet TAKE 1 TABLET BY MOUTH EVERY 2 HOURS AS NEEDED FOR MIGRAINE.MAY REPEAT IN 2 HOURS IF HEADACHE PERSISTS OR RECURS. 10 tablet 6  . amitriptyline (ELAVIL) 50 MG tablet Take 1 tablet (50 mg total) by mouth at bedtime. 90 tablet 0   No current facility-administered medications on file prior to visit.    BP 122/70   Pulse 77   Temp 97.9 F (36.6 C) (Oral)   Ht 6\' 2"  (1.88 m)   Wt 253 lb (114.8 kg)   SpO2 98%   BMI 32.48 kg/m       Objective:   Physical Exam Vitals and nursing note reviewed.  Constitutional:      Appearance: Normal appearance.  Musculoskeletal:        General: Tenderness (mild spinal tenderness at L4-L5. No deformity felt ) present. No swelling or deformity. Normal range of motion.  Skin:    General: Skin is warm  and dry.     Capillary Refill: Capillary refill takes less than 2 seconds.  Neurological:     General: No focal deficit present.     Mental Status: He is alert and oriented to person, place, and time.       Assessment & Plan:  1. Acute midline low back pain without sciatica - He bought a back brace but has not started using it yet. Advised to start using at work. Proper lifting technique shown.  - DG Lumbar Spine Complete; Future  [name], NP

## 2020-06-23 ENCOUNTER — Other Ambulatory Visit: Payer: Self-pay

## 2020-06-23 ENCOUNTER — Encounter: Payer: Self-pay | Admitting: Adult Health

## 2020-06-23 ENCOUNTER — Ambulatory Visit: Payer: 59 | Admitting: Adult Health

## 2020-06-23 VITALS — BP 124/84 | Temp 98.1°F | Wt 250.0 lb

## 2020-06-23 DIAGNOSIS — M7022 Olecranon bursitis, left elbow: Secondary | ICD-10-CM | POA: Diagnosis not present

## 2020-06-23 DIAGNOSIS — Z23 Encounter for immunization: Secondary | ICD-10-CM

## 2020-06-23 NOTE — Progress Notes (Signed)
Subjective:    Patient ID: Gerald Johnson, male    DOB: [birth year], 60 y.o.   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  He presents to the office today for an acute issue of left sided elbow pain and swelling. Reports that his elbow has been swollen for the last four days. He did slip on ice and hit his elbow. No loss of ROM. Has not noticed any redness or warmth   Review of Systems See HPI   Past Medical History:  Diagnosis Date  . Acute prostatitis 12/02/2007  . ALLERGIC RHINITIS 02/26/2007  . Asthma   . 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 Headache behind the eyes 07/03/2010   New problem noted 27 2012   . Hemorrhoids   . HYPERLIPIDEMIA 02/26/2007  . Migraines   . Sciatica 05/04/2008  . SINUSITIS, RECURRENT 02/28/2010    Social History   Socioeconomic History  . Marital status: Single    Spouse name: Not on file  . Number of children: Not on file  . Years of education: Not on file  . Highest education level: Not on file  Occupational History  . Not on file  Tobacco Use  . Smoking status: Never Smoker  . Smokeless tobacco: Never Used  Substance and Sexual Activity  . Alcohol use: Yes    Alcohol/week: 7.0 standard drinks    Types: 7 Cans of beer per week    Comment: daily  . Drug use: No  . Sexual activity: Yes    Birth control/protection: Condom  Other Topics Concern  . Not on file  Social History Narrative   Works as a Nature conservation officer.    Not married    No kids    He likes to go to the gym. Likes to go to go to restaurants.    Social Determinants of Health   Financial Resource Strain: Not on file  Food Insecurity: Not on file  Transportation  Needs: Not on file  Physical Activity: Not on file  Stress: Not on file  Social Connections: Not on file  Intimate Partner Violence: Not on file    Past Surgical History:  Procedure Laterality Date  . WISDOM TOOTH EXTRACTION      Family History  Problem Relation Age of Onset  . Diabetes Brother   . Colon cancer Neg Hx   . Esophageal cancer Neg Hx   . Rectal cancer Neg Hx   . Stomach cancer Neg Hx   . Gout Neg Hx     No Known Allergies  Current Outpatient Medications on File Prior to Visit  Medication Sig Dispense Refill  . Aspirin-Acetaminophen-Caffeine (EXCEDRIN PO) Take by mouth as needed.    . RESTASIS 0.05 % ophthalmic emulsion 1 drop 2 (two) times daily.    . sildenafil (VIAGRA) 50 MG tablet Take 1 tablet (50 mg total) by mouth daily as needed for erectile dysfunction (take 1/2 to 1 pill as needed). 10 tablet 6  . SUMAtriptan (IMITREX) 50 MG tablet TAKE 1 TABLET BY MOUTH EVERY 2 HOURS AS NEEDED FOR MIGRAINE.MAY REPEAT IN 2 HOURS IF HEADACHE PERSISTS OR RECURS. 10 tablet 6  . amitriptyline (ELAVIL) 50 MG tablet Take 1 tablet (50 mg total) by mouth at bedtime. 90 tablet 0  No current facility-administered medications on file prior to visit.    BP 124/84   Temp 98.1 F (36.7 C)   Wt 250 lb (113.4 kg)   BMI 32.10 kg/m       Objective:   Physical Exam Vitals and nursing note reviewed.  Constitutional:      Appearance: Normal appearance.  Musculoskeletal:        General: Swelling and tenderness present. Normal range of motion.     Comments: Swelling of left olecranon noted. No redness or warmth  Skin:    General: Skin is warm and dry.     Capillary Refill: Capillary refill takes less than 2 seconds.  Neurological:     General: No focal deficit present.     Mental Status: He is alert and oriented to person, place, and time.       Assessment & Plan:  1. Olecranon bursitis of left elbow - Verbal consent obtained for drainage of olecranon bursitis. Area  cleaned with betadine and alcohol. Uses cold spray topical anesthesia was achieved. Using a 22 g needle, 5 ml of yellow clear fluid was removed. Patient tolerated procedure well. Ace bandage applied and advised to wear compression for the next 36 hours.  - Follow up instructions reviewed  [name], NP

## 2020-07-12 ENCOUNTER — Other Ambulatory Visit: Payer: Self-pay

## 2020-07-13 ENCOUNTER — Encounter: Payer: Self-pay | Admitting: Adult Health

## 2020-07-13 ENCOUNTER — Ambulatory Visit: Payer: 59 | Admitting: Adult Health

## 2020-07-13 VITALS — BP 140/100 | Temp 97.6°F | Wt 249.0 lb

## 2020-07-13 DIAGNOSIS — M7022 Olecranon bursitis, left elbow: Secondary | ICD-10-CM | POA: Diagnosis not present

## 2020-07-13 DIAGNOSIS — M545 Low back pain, unspecified: Secondary | ICD-10-CM

## 2020-07-13 MED ORDER — METHYLPREDNISOLONE ACETATE 40 MG/ML IJ SUSP
40.0000 mg | Freq: Once | INTRAMUSCULAR | Status: AC
  Administered 2020-07-13: 40 mg via INTRA_ARTICULAR

## 2020-07-13 NOTE — Progress Notes (Signed)
Subjective:    Patient ID: Gerald Johnson, male    DOB: January 22, 1961, 60 y.o.   MRN: [id number]  HPI  60-year-old male who is being evaluated today for follow-up regarding olecranon bursitis of left elbow.  He was only seen on 06/23/2020 which point we drained approximately 5 mL of yellow clear fluid.  Reports that after about 4 days it "filled back up".  Continues to deny pain, redness, or warmth.  Nationally, he suffers from chronic back pain and we have talked about physical therapy in the past which she has refused.  Currently ready to go to physical therapy would like me to refer him  Review of Systems See HPI   Past Medical History:  Diagnosis Date  . Acute prostatitis 12/02/2007  . ALLERGIC RHINITIS 02/26/2007  . Asthma   . 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 Headache behind the eyes 07/03/2010   New problem noted 60 2012   . Hemorrhoids   . HYPERLIPIDEMIA 02/26/2007  . Migraines   . Sciatica 05/04/2008  . SINUSITIS, RECURRENT 02/28/2010    Social History   Socioeconomic History  . Marital status: Single    Spouse name: Not on file  . Number of children: Not on file  . Years of education: Not on file  . Highest education level: Not on file  Occupational History  . Not on file  Tobacco Use  . Smoking status: Never Smoker  . Smokeless tobacco: Never Used  Substance and Sexual Activity  . Alcohol use: Yes    Alcohol/week: 7.0 standard drinks    Types: 7 Cans of beer per week    Comment: daily  . Drug use: No  . Sexual activity: Yes    Birth control/protection: Condom  Other Topics Concern  . Not on file  Social History Narrative   Works as a Nature conservation officer.    Not married    No kids    He likes to go to the gym. Likes to go to go to restaurants.    Social Determinants of Health   Financial Resource Strain: Not on file  Food Insecurity: Not on file  Transportation Needs: Not on file  Physical Activity: Not on file  Stress: Not on file   Social Connections: Not on file  Intimate Partner Violence: Not on file    Past Surgical History:  Procedure Laterality Date  . WISDOM TOOTH EXTRACTION      Family History  Problem Relation Age of Onset  . Diabetes Brother   . Colon cancer Neg Hx   . Esophageal cancer Neg Hx   . Rectal cancer Neg Hx   . Stomach cancer Neg Hx   . Gout Neg Hx     No Known Allergies  Current Outpatient Medications on File Prior to Visit  Medication Sig Dispense Refill  . Aspirin-Acetaminophen-Caffeine (EXCEDRIN PO) Take by mouth as needed.    . RESTASIS 0.05 % ophthalmic emulsion 1 drop 2 (two) times daily.    . sildenafil (VIAGRA) 50 MG tablet Take 1 tablet (50 mg total) by mouth daily as needed for erectile dysfunction (take 1/2 to 1 pill as needed). 10 tablet 6  . SUMAtriptan (IMITREX) 50 MG tablet TAKE 1 TABLET BY MOUTH EVERY 2 HOURS AS NEEDED FOR MIGRAINE.MAY REPEAT IN 2 HOURS IF HEADACHE PERSISTS OR RECURS. 10 tablet 6  . amitriptyline (ELAVIL) 50 MG tablet Take 1 tablet (50 mg total) by mouth at bedtime. 90 tablet 0   No current  facility-administered medications on file prior to visit.    BP (!) 140/100   Temp 97.6 F (36.4 C)   Wt 249 lb (112.9 kg)   BMI 31.97 kg/m       Objective:   Physical Exam Vitals and nursing note reviewed.  Constitutional:      Appearance: Normal appearance.  Musculoskeletal:        General: Normal range of motion.     Right elbow: Normal.     Left elbow: Swelling present. Normal range of motion. No tenderness.  Skin:    General: Skin is warm and dry.     Capillary Refill: Capillary refill takes less than 2 seconds.  Neurological:     General: No focal deficit present.     Mental Status: He is oriented to person, place, and time.  Psychiatric:        Mood and Affect: Mood normal.        Behavior: Behavior normal.        Thought Content: Thought content normal.        Judgment: Judgment normal.       Assessment & Plan:  1. Olecranon  bursitis of left elbow -Verbal consent obtained for drainage of all chronic bursitis.  Area cleaned with Betadine and alcohol.  Using cold spray topical anesthesia was achieved.  Using a 22-gauge needle, approximately 8 mL of clear yellow fluid was removed.  Her bursa fluid was removed 40 mg of Depo-Medrol was injected into the olecranon bursa.  Patient tolerated procedure well.  Advised to continue to wear Ace bandage. - methylPREDNISolone acetate (DEPO-MEDROL) injection 40 mg  2. Acute midline low back pain without sciatica  - Ambulatory referral to Physical Therapy   [name], NP

## 2020-08-03 ENCOUNTER — Telehealth: Payer: 59 | Admitting: Adult Health

## 2020-08-03 ENCOUNTER — Telehealth (INDEPENDENT_AMBULATORY_CARE_PROVIDER_SITE_OTHER): Payer: 59 | Admitting: Adult Health

## 2020-08-03 DIAGNOSIS — J069 Acute upper respiratory infection, unspecified: Secondary | ICD-10-CM | POA: Diagnosis not present

## 2020-08-03 MED ORDER — METHYLPREDNISOLONE 4 MG PO TBPK: ORAL_TABLET | ORAL | 0 refills | Status: DC

## 2020-08-03 NOTE — Progress Notes (Signed)
Virtual Visit via Telephone Note  I connected with Gerald Johnson on 08/03/20 at 11:00 AM EST by telephone and verified that I am speaking with the correct person using two identifiers.   I discussed the limitations, risks, security and privacy concerns of performing an evaluation and management service by telephone and the availability of in person appointments. I also discussed with the patient that there may be a patient responsible charge related to this service. The patient expressed understanding and agreed to proceed.  Location patient: home Location provider: work or home office Participants present for the call: patient, provider Patient did not have a visit in the prior 7 days to address this/these issue(s).   History of Present Illness: 60 year old male who is being evaluated today for upper respiratory complaints.  He reports that his symptoms started about 5 days ago.  Symptoms include semiproductive cough, nasal congestion, wheezing developed a headache earlier today.  He used some leftover prednisone that he had at home over the last 3 days and reports improvement but ran out.  Has also been using NyQuil, DayQuil and TheraFlu without relief.  He denies fevers or chills.   Observations/Objective: Patient sounds cheerful and well on the phone. I do not appreciate any SOB. Speech and thought processing are grossly intact. Patient reported vitals:  Assessment and Plan: 1. Upper respiratory tract infection, unspecified type - Seasonal allergies vs URI. Will continue him on prednisone  - Start OTC allergy medication such as zyrtec or claritin  - Follow up If symptoms do not resolve in the next week or sooner if symptoms worsen  - methylPREDNISolone (MEDROL DOSEPAK) 4 MG TBPK tablet; Take as directed  Dispense: 21 tablet; Refill: 0   Follow Up Instructions:   I did not refer this patient for an OV in the next 24 hours for this/these issue(s).  I discussed the assessment and  treatment plan with the patient. The patient was provided an opportunity to ask questions and all were answered. The patient agreed with the plan and demonstrated an understanding of the instructions.   The patient was advised to call back or seek an in-person evaluation if the symptoms worsen or if the condition fails to improve as anticipated.  I provided 15 minutes of non-face-to-face time during this encounter.   [name], NP

## 2020-08-07 ENCOUNTER — Ambulatory Visit: Payer: 59 | Admitting: Physical Therapy

## 2020-08-15 ENCOUNTER — Ambulatory Visit: Payer: 59 | Attending: Adult Health | Admitting: Physical Therapy

## 2020-08-15 ENCOUNTER — Encounter: Payer: Self-pay | Admitting: Physical Therapy

## 2020-08-15 ENCOUNTER — Other Ambulatory Visit: Payer: Self-pay

## 2020-08-15 DIAGNOSIS — R262 Difficulty in walking, not elsewhere classified: Secondary | ICD-10-CM | POA: Diagnosis present

## 2020-08-15 DIAGNOSIS — M545 Low back pain, unspecified: Secondary | ICD-10-CM | POA: Insufficient documentation

## 2020-08-15 DIAGNOSIS — G8929 Other chronic pain: Secondary | ICD-10-CM

## 2020-08-15 DIAGNOSIS — M6281 Muscle weakness (generalized): Secondary | ICD-10-CM | POA: Diagnosis present

## 2020-08-15 NOTE — Therapy (Addendum)
[facility] [address]  [place], [place], [address] Phone: [phone]   Fax:  [phone]  Physical Therapy Evaluation  Patient Details  Name: Gerald Johnson MRN: [id number] Date of Birth: 07-27-1960 Referring Provider (PT): [name], NP   Encounter Date: 08/15/2020   PT End of Session - 08/15/20 1742    Visit Number 1    Number of Visits 7    Date for PT Re-Evaluation 09/26/20    Authorization Type [employer]    PT Start Time 1701    PT Stop Time 1735    PT Time Calculation (min) 34 min    Activity Tolerance Patient tolerated treatment well    Behavior During Therapy [facility] for tasks assessed/performed           Past Medical History:  Diagnosis Date  . Acute prostatitis 12/02/2007  . ALLERGIC RHINITIS 02/26/2007  . Asthma   . 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 Headache behind the eyes 07/03/2010   New problem noted 27 2012   . Hemorrhoids   . HYPERLIPIDEMIA 02/26/2007  . Migraines   . Sciatica 05/04/2008  . SINUSITIS, RECURRENT 02/28/2010    Past Surgical History:  Procedure Laterality Date  . WISDOM TOOTH EXTRACTION      There were no vitals filed for this visit.    Subjective Assessment - 08/15/20 1703    Subjective Patient reports LBP for the last year without specific injury. Reports that his job requires him to lift up to 50lbs. Tried to use a back brace but that didn't help. Pain occurs along the midline of the LB without radiation. Notes infrequent N/T or "vibration" in the L lateral thigh above the knee. Pain worse with bending forward, prolonged sitting, standing, walking, after work. Better with rest. Denies changes in B&B control.    Pertinent History sciatica, migraines, asthma    Limitations Sitting;Lifting;Standing;Walking;House hold activities    How long can you sit comfortably? 30 min    How long can you stand comfortably? 30 min    How long can you walk comfortably? 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 No fracture or spondylolisthesis.    Patient Stated Goals decrease pain    Currently in Pain? Yes    Pain Score 5     Pain Location Back    Pain Orientation Lower   midline   Pain Descriptors / Indicators Dull              [facility] PT Assessment - 08/15/20 1708      Assessment   Medical Diagnosis Acute midline LBP without sciatica    Referring Provider (PT) [name], NP    Onset Date/Surgical Date 08/16/19    Next MD Visit not scheduled    Prior Therapy no      Balance Screen   Has the patient fallen in the past 6 months No    Has the patient had a decrease in activity level because of a fear of falling?  No    Is the patient reluctant to leave their home because of a fear of falling?  No      Home Social worker Private residence    Living Arrangements Alone    Available Help at Discharge Friend(s)    Type of [place] Access Level entry    [place] Two level  Alternate Level Stairs-Number of Steps 12    Alternate Level Stairs-Rails Right;Left    Home Equipment None      Prior Function   Level of Independence Independent    Vocation Full time employment    Vocation Requirements lifting up to 50lbs; standing, walking    Leisure workout      Cognition   Overall Cognitive Status Within Functional Limits for tasks assessed      Sensation   Light Touch Appears Intact      Coordination   Gross Motor Movements are Fluid and Coordinated Yes      Posture/Postural Control   Posture/Postural Control Postural limitations    Postural Limitations Rounded Shoulders;Posterior pelvic tilt   in sitting     ROM / Strength   AROM / PROM / Strength AROM;Strength      AROM   AROM Assessment Site Lumbar    Lumbar Flexion distal shin    Lumbar Extension mod-severely limited    Lumbar - Right Side Bend mid thigh    Lumbar - Left Side Bend distal  thigh    Lumbar - Right Rotation mildly limited    Lumbar - Left Rotation mildly limited      Strength   Strength Assessment Site Hip;Knee;Ankle    Right/Left Hip Right;Left    Right Hip Flexion 4/5    Right Hip ABduction 4+/5    Right Hip ADduction 4/5    Left Hip Flexion 4/5    Left Hip ABduction 4+/5    Left Hip ADduction 4/5    Right/Left Knee Right;Left    Right Knee Flexion 4+/5    Right Knee Extension 4+/5    Left Knee Flexion 4+/5    Left Knee Extension 4+/5   L knee crepitus   Right/Left Ankle Right;Left    Right Ankle Dorsiflexion 4+/5    Right Ankle Plantar Flexion 3+/5   <5 reps with poor form   Left Ankle Dorsiflexion 4+/5    Left Ankle Plantar Flexion 3+/5   <5 reps with poor form     Flexibility   Soft Tissue Assessment /Muscle Length yes    Hamstrings B WFL    Quadriceps B moderately tight in mod thomas    Piriformis B mild-moderately tight in KTOS and fig 4      Palpation   Palpation comment no TTP along midline of thoracic and lumbar spine; TTP in B QL and L piriformis with soft tissue restriction; TTP over R greater trochanter      Ambulation/Gait   Assistive device None    Gait Pattern Step-through pattern;Decreased step length - right;Decreased step length - left;Decreased dorsiflexion - right;Decreased dorsiflexion - left;Poor foot clearance - left;Poor foot clearance - right   R knee instability   Ambulation Surface Level;Indoor    Gait velocity decreased                      Objective measurements completed on examination: See above findings.               PT Education - 08/15/20 1742    Education Details prognosis, POC,HEP    Person(s) Educated Patient    Methods Explanation;Demonstration;Tactile cues;Verbal cues;Handout    Comprehension Verbalized understanding;Returned demonstration            PT Short Term Goals - 08/15/20 1748      PT SHORT TERM GOAL #1   Title Patient to be independent with initial HEP.  Period Weeks    Status New    Target Date 09/05/20             PT Long Term Goals - 08/15/20 1748      PT LONG TERM GOAL #1   Title Patient to be independent with advanced HEP.    Time 6    Period Weeks    Status New    Target Date 09/26/20      PT LONG TERM GOAL #2   Title Patient to demonstrate B LE strength >/=4+/5.    Time 6    Period Weeks    Status New    Target Date 09/26/20      PT LONG TERM GOAL #3   Title Patient to demonstrate lumbar AROM WFL and without pain limiting.    Time 6    Period Weeks    Status New    Target Date 09/26/20      PT LONG TERM GOAL #4   Title Patient to report tolerance for 2 hours on his feet without pain limiting.    Time 6    Period Weeks    Status New    Target Date 09/26/20      PT LONG TERM GOAL #5   Title Patient to return to gym with modifications as needed without pain limiting.    Time 6    Period Weeks    Status New    Target Date 09/26/20                  Plan - 08/15/20 1743    Clinical Impression Statement Patient is a 60 y/o M presenting to OPPT with c/o chronic midline LBP for the past year without inciting injury. Denies radiation, however does note infrequent sensation of "vibration" over the L lateral thigh above the knee. Denies changes in B&B control. Pain worse with bending forward, prolonged sitting, standing, walking, after work. Patient today presenting with rounded shoulders and posterior pelvic tilt in sitting, limited lumbar AROM, decreased hip and calf strength, decreased hip flexibility, TTP in B QL and L piriformis with soft tissue restriction and TTP over R greater trochanter, and gait deviations. Patient was educated on gentle lumbar mobility and stretching HEP- patient reported understanding. Would benefit from skilled PT services 1x/week for 6 weeks to address aforementioned impairments.    Personal Factors and Comorbidities Age;Comorbidity 3+;Fitness;Past/Current Experience;Profession;Time  since onset of injury/illness/exacerbation    Comorbidities sciatica, migraines, asthma    Examination-Activity Limitations Sit;Bend;Squat;Stairs;Carry;Stand;Transfers;Dressing;Hygiene/Grooming;Lift;Reach Overhead;Locomotion Level    Examination-Participation Restrictions Church;Cleaning;Shop;Community Activity;Driving;Yard Work;Laundry;Meal Prep;Occupation    Stability/Clinical Decision Making Stable/Uncomplicated    Clinical Decision Making Low    Rehab Potential Good    PT Frequency 1x / week    PT Duration 6 weeks    PT Treatment/Interventions ADLs/Self Care Home Management;Cryotherapy;Electrical Stimulation;Moist Heat;Traction;Therapeutic exercise;Therapeutic activities;Functional mobility training;Stair training;Gait training;Ultrasound;Neuromuscular re-education;Patient/family education;Manual techniques;Taping;Energy conservation;Dry needling;Passive range of motion    PT Next Visit Plan lumbar FOTO; reassess HEP; progress lumbar mobility, hip and calf strength    Consulted and Agree with Plan of Care Patient           Patient will benefit from skilled therapeutic intervention in order to improve the following deficits and impairments:  Abnormal gait,Hypomobility,Decreased activity tolerance,Decreased strength,Increased fascial restricitons,Pain,Difficulty walking,Increased muscle spasms,Decreased range of motion,Improper body mechanics,Postural dysfunction,Impaired flexibility  Visit Diagnosis: Chronic midline low back pain without sciatica  Muscle weakness (generalized)  Difficulty in walking, not elsewhere classified     Problem  List Patient Active Problem List   Diagnosis Date Noted  . Migraine 09/17/2017  . Gout 01/27/2015  . 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 Mixed hyperlipidemia 02/26/2007  . SINUSITIS, ACUTE NOS 02/26/2007  . Allergic rhinitis 02/26/2007     Janene Harvey, PT, DPT 08/15/20 5:52 PM   [facility] [address]  [place], [place], [address] Phone: [phone]   Fax:  [phone]  Name: Gerald Johnson MRN: [id number] Date of Birth: [birth year]   PHYSICAL THERAPY DISCHARGE SUMMARY  Visits from Start of Care: 1  Current functional level related to goals / functional outcomes: See above clinical impression; patient did not return    Remaining deficits: See above   Education / Equipment: HEP  Plan: Patient agrees to discharge.  Patient goals were not met. Patient is being discharged due to not returning since the last visit.  ?????     Janene Harvey, PT, DPT 10/12/20 3:13 PM

## 2020-08-23 ENCOUNTER — Ambulatory Visit: Payer: 59 | Admitting: Physical Therapy

## 2020-08-30 ENCOUNTER — Ambulatory Visit: Payer: 59 | Attending: Adult Health | Admitting: Physical Therapy

## 2020-09-06 ENCOUNTER — Encounter: Payer: 59 | Admitting: Physical Therapy

## 2020-09-13 ENCOUNTER — Ambulatory Visit (INDEPENDENT_AMBULATORY_CARE_PROVIDER_SITE_OTHER): Payer: 59

## 2020-09-13 ENCOUNTER — Other Ambulatory Visit: Payer: Self-pay

## 2020-09-13 ENCOUNTER — Ambulatory Visit: Payer: 59 | Admitting: Adult Health

## 2020-09-13 ENCOUNTER — Encounter: Payer: Self-pay | Admitting: Adult Health

## 2020-09-13 VITALS — BP 132/72 | HR 86 | Temp 98.2°F | Ht 74.0 in | Wt 252.4 lb

## 2020-09-13 DIAGNOSIS — M25562 Pain in left knee: Secondary | ICD-10-CM | POA: Diagnosis not present

## 2020-09-13 NOTE — Progress Notes (Signed)
Subjective:    Patient ID: Gerald Johnson, male    DOB: June 19, 1960, 60 y.o.   MRN: [id number]  HPI 60 year old male whyo  has a past medical history of Acute prostatitis (12/02/2007), ALLERGIC RHINITIS (02/26/2007), Asthma, ERECTILE DYSFUNCTION, NON-ORGANIC, MILD (11/08/2008), Headache behind the eyes (07/03/2010), Hemorrhoids, HYPERLIPIDEMIA (02/26/2007), Migraines, Sciatica (05/04/2008), and SINUSITIS, RECURRENT (02/28/2010).  He present to the office today  for an acute issue of left knee pain.  Patient report he has had intermittent left knee pain along the inside of his knee for the last month.  Notices it more when he is on his feet for extended periods of time.  He takes Excedrin which may or may not help.  Usually rest works the best.  He denies trauma or aggravating injury.  Has not noticed any redness, warmth, or swelling.  His discomfort which is described as a "dull pain" was worse yesterday after standing for roughly 12 hours, which prompted him to come in.  Has no pain currently  Review of Systems See HPI   Past Medical History:  Diagnosis Date  . Acute prostatitis 12/02/2007  . ALLERGIC RHINITIS 02/26/2007  . Asthma   . 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 Headache behind the eyes 07/03/2010   New problem noted 27 2012   . Hemorrhoids   . HYPERLIPIDEMIA 02/26/2007  . Migraines   . Sciatica 05/04/2008  . SINUSITIS, RECURRENT 02/28/2010    Social History   Socioeconomic History  . Marital status: Single    Spouse name: Not on file  . Number of children: Not on file  . Years of education: Not on file  . Highest education level: Not on file  Occupational History  . Not on file  Tobacco Use  . Smoking status: Never Smoker  . Smokeless tobacco: Never Used  Substance and Sexual Activity  . Alcohol use: Yes    Alcohol/week: 7.0 standard drinks    Types: 7 Cans of beer per week    Comment: daily  . Drug use: No  . Sexual activity: Yes    Birth  control/protection: Condom  Other Topics Concern  . Not on file  Social History Narrative   Works as a Nature conservation officer.    Not married    No kids    He likes to go to the gym. Likes to go to go to restaurants.    Social Determinants of Health   Financial Resource Strain: Not on file  Food Insecurity: Not on file  Transportation Needs: Not on file  Physical Activity: Not on file  Stress: Not on file  Social Connections: Not on file  Intimate Partner Violence: Not on file    Past Surgical History:  Procedure Laterality Date  . WISDOM TOOTH EXTRACTION      Family History  Problem Relation Age of Onset  . Diabetes Brother   . Colon cancer Neg Hx   . Esophageal cancer Neg Hx   . Rectal cancer Neg Hx   . Stomach cancer Neg Hx   . Gout Neg Hx     No Known Allergies  Current Outpatient Medications on File Prior to Visit  Medication Sig Dispense Refill  . amitriptyline (ELAVIL) 50 MG tablet Take 1 tablet (50 mg total) by mouth at bedtime. 90 tablet 0  . Aspirin-Acetaminophen-Caffeine (EXCEDRIN PO) Take by mouth as needed.    . RESTASIS 0.05 % ophthalmic emulsion 1 drop 2 (two) times daily.    . sildenafil (  VIAGRA) 50 MG tablet Take 1 tablet (50 mg total) by mouth daily as needed for erectile dysfunction (take 1/2 to 1 pill as needed). 10 tablet 6  . SUMAtriptan (IMITREX) 50 MG tablet TAKE 1 TABLET BY MOUTH EVERY 2 HOURS AS NEEDED FOR MIGRAINE.MAY REPEAT IN 2 HOURS IF HEADACHE PERSISTS OR RECURS. 10 tablet 6   No current facility-administered medications on file prior to visit.    BP 132/72 (BP Location: Left Arm, Patient Position: Sitting, Cuff Size: Normal)   Pulse 86   Temp 98.2 F (36.8 C) (Oral)   Ht 6\' 2"  (1.88 m)   Wt 252 lb 6 oz (114.5 kg)   SpO2 96%   BMI 32.40 kg/m       Objective:   Physical Exam Vitals and nursing note reviewed.  Constitutional:      Appearance: Normal appearance.  Musculoskeletal:        General: No swelling or deformity.      Right knee: Normal.     Left knee: No swelling, deformity, effusion, erythema, ecchymosis, bony tenderness or crepitus. Normal range of motion. Tenderness present over the LCL. No patellar tendon tenderness. No LCL laxity.Normal alignment and normal meniscus.     Instability Tests: Anterior drawer test negative. Posterior drawer test negative. Anterior Lachman test negative. Medial McMurray test negative and lateral McMurray test negative.     Comments: Discomfort with palpation along the LCL.  He also has discomfort along the LCL with knee-to-chest and external rotation of the left knee  Skin:    General: Skin is warm and dry.     Capillary Refill: Capillary refill takes less than 2 seconds.  Neurological:     General: No focal deficit present.     Mental Status: He is alert and oriented to person, place, and time.  Psychiatric:        Mood and Affect: Mood normal.        Behavior: Behavior normal.        Thought Content: Thought content normal.        Judgment: Judgment normal.       Assessment & Plan:  1. Acute pain of left knee -Seems to be more ligament strain.  No rupture noted.  He would like an x-ray of his left knee to make sure there is nothing else going on with it.  Advise anti-inflammatory medicationsas needed, rest, and  and ice is much as possible. - DG Knee 1-2 Views Left; Future - Follow up in 3-4 weeks if not resolved or sooner if symptoms become worse  [name], NP

## 2020-09-27 ENCOUNTER — Encounter: Payer: 59 | Admitting: Physical Therapy

## 2020-10-03 ENCOUNTER — Ambulatory Visit: Payer: 59 | Admitting: Adult Health

## 2020-10-03 ENCOUNTER — Other Ambulatory Visit: Payer: Self-pay

## 2020-10-03 ENCOUNTER — Ambulatory Visit (INDEPENDENT_AMBULATORY_CARE_PROVIDER_SITE_OTHER): Payer: 59

## 2020-10-03 VITALS — BP 144/86 | HR 99 | Temp 97.7°F | Ht 74.0 in | Wt 255.4 lb

## 2020-10-03 DIAGNOSIS — M25521 Pain in right elbow: Secondary | ICD-10-CM

## 2020-10-03 DIAGNOSIS — N401 Enlarged prostate with lower urinary tract symptoms: Secondary | ICD-10-CM

## 2020-10-03 LAB — URINALYSIS, ROUTINE W REFLEX MICROSCOPIC
Bilirubin Urine: NEGATIVE
Ketones, ur: NEGATIVE
Leukocytes,Ua: NEGATIVE
Nitrite: NEGATIVE
Specific Gravity, Urine: 1.02 (ref 1.000–1.030)
Total Protein, Urine: NEGATIVE
Urine Glucose: NEGATIVE
Urobilinogen, UA: 0.2 (ref 0.0–1.0)
pH: 6 (ref 5.0–8.0)

## 2020-10-03 LAB — PSA: PSA: 41.15 ng/mL — ABNORMAL HIGH (ref 0.10–4.00)

## 2020-10-03 MED ORDER — TAMSULOSIN HCL 0.4 MG PO CAPS: 0.4000 mg | ORAL_CAPSULE | Freq: Every day | ORAL | 0 refills | Status: DC

## 2020-10-03 NOTE — Progress Notes (Signed)
Subjective:    Patient ID: Gerald Johnson, male    DOB: 1961-01-28, 60 y.o.   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  He presents to the office today for two separate issues   1. Right elbow pain - reports pain in his right elbow over the last month. Pain is an " aching pain. Pain is constant. Pain is located throughout the elbow. He denies trauma or aggravating injury. Pain with heavy weights and palpation. He has not noticed and swelling, redness, or warmth. Advil seems to help   2. Nocturia - reports for an unknown amount of time he has been experiencing nocturia ( getting up 5 times a night to urinate), decreased stream, and feeling of incomplete bladder emptying. He denies dysuria, hematuria or other signs of UTI. He did have an elevated PSA back in 2019 but never followed up for recheck  Lab Results  Component Value Date   PSA 11.20 (H) 03/18/2018   PSA 3.55 08/09/2015   PSA 1.38 03/31/2012   Review of Systems See HPI   Past Medical History:  Diagnosis Date  . Acute prostatitis 12/02/2007  . ALLERGIC RHINITIS 02/26/2007  . Asthma   . 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 Headache behind the eyes 07/03/2010   New problem noted 27 2012   . Hemorrhoids   . HYPERLIPIDEMIA 02/26/2007  . Migraines   . Sciatica 05/04/2008  . SINUSITIS, RECURRENT 02/28/2010    Social History   Socioeconomic History  . Marital status: Single    Spouse name: Not on file  . Number of children: Not on file  . Years of education: Not on file  . Highest education level: Not on file  Occupational History  . Not on file  Tobacco Use  . Smoking status: Never Smoker  . Smokeless tobacco:  Never Used  Substance and Sexual Activity  . Alcohol use: Yes    Alcohol/week: 7.0 standard drinks    Types: 7 Cans of beer per week    Comment: daily  . Drug use: No  . Sexual activity: Yes    Birth control/protection: Condom  Other Topics Concern  . Not on file  Social History Narrative   Works as a Nature conservation officer.    Not married    No kids    He likes to go to the gym. Likes to go to go to restaurants.    Social Determinants of Health   Financial Resource Strain: Not on file  Food Insecurity: Not on file  Transportation Needs: Not on file  Physical Activity: Not on file  Stress: Not on file  Social Connections: Not on file  Intimate Partner Violence: Not on file    Past Surgical History:  Procedure Laterality Date  . WISDOM TOOTH EXTRACTION      Family History  Problem Relation Age of Onset  . Diabetes Brother   . Colon cancer Neg Hx   . Esophageal cancer Neg Hx   . Rectal cancer Neg Hx   . Stomach cancer Neg Hx   . Gout Neg Hx     No Known Allergies  Current Outpatient Medications on File Prior to Visit  Medication Sig Dispense Refill  . amitriptyline (ELAVIL) 50 MG tablet Take 1 tablet (50 mg total)  by mouth at bedtime. 90 tablet 0  . Aspirin-Acetaminophen-Caffeine (EXCEDRIN PO) Take by mouth as needed.    . RESTASIS 0.05 % ophthalmic emulsion 1 drop 2 (two) times daily.    . sildenafil (VIAGRA) 50 MG tablet Take 1 tablet (50 mg total) by mouth daily as needed for erectile dysfunction (take 1/2 to 1 pill as needed). 10 tablet 6  . SUMAtriptan (IMITREX) 50 MG tablet TAKE 1 TABLET BY MOUTH EVERY 2 HOURS AS NEEDED FOR MIGRAINE.MAY REPEAT IN 2 HOURS IF HEADACHE PERSISTS OR RECURS. 10 tablet 6   No current facility-administered medications on file prior to visit.    BP (!) 144/86 (BP Location: Left Arm, Patient Position: Sitting, Cuff Size: Large)   Pulse 99   Temp 97.7 F (36.5 C) (Oral)   Ht 6\' 2"  (1.88 m)   Wt 255 lb 6.4 oz (115.8 kg)   SpO2 97%    BMI 32.79 kg/m       Objective:   Physical Exam Vitals and nursing note reviewed.  Constitutional:      Appearance: Normal appearance.  Musculoskeletal:        General: Normal range of motion.     Right elbow: Tenderness present in olecranon process. No radial head, medial epicondyle or lateral epicondyle tenderness.     Left elbow: Normal.  Skin:    General: Skin is warm and dry.     Capillary Refill: Capillary refill takes less than 2 seconds.  Neurological:     General: No focal deficit present.     Mental Status: He is alert and oriented to person, place, and time.  Psychiatric:        Mood and Affect: Mood normal.        Behavior: Behavior normal.        Thought Content: Thought content normal.        Judgment: Judgment normal.       Assessment & Plan:  1. Benign prostatic hyperplasia with lower urinary tract symptoms, symptom details unspecified  - PSA; Future - Urinalysis; Future - tamsulosin (FLOMAX) 0.4 MG CAPS capsule; Take 1 capsule (0.4 mg total) by mouth daily after breakfast.  Dispense: 90 capsule; Refill: 0 - Urinalysis - PSA  2. Right elbow pain - no signs of trauma or bursitis.  - Likely arthritic in nature  - DG Elbow Complete Right; Future - Can continue with motrin PRN   [name], NP

## 2020-10-04 ENCOUNTER — Telehealth: Payer: Self-pay | Admitting: Adult Health

## 2020-10-04 DIAGNOSIS — R972 Elevated prostate specific antigen [PSA]: Secondary | ICD-10-CM

## 2020-10-04 NOTE — Telephone Encounter (Signed)
Updated patient on labs. PSA 41  Will send to urology for further evaluation

## 2020-10-10 ENCOUNTER — Telehealth (INDEPENDENT_AMBULATORY_CARE_PROVIDER_SITE_OTHER): Payer: 59 | Admitting: Adult Health

## 2020-10-10 ENCOUNTER — Encounter: Payer: Self-pay | Admitting: Adult Health

## 2020-10-10 DIAGNOSIS — B349 Viral infection, unspecified: Secondary | ICD-10-CM | POA: Diagnosis not present

## 2020-10-10 NOTE — Progress Notes (Signed)
Virtual Visit via Telephone Note  I connected with Gerald Johnson on 10/10/20 at  4:30 PM EDT by telephone and verified that I am speaking with the correct person using two identifiers.   I discussed the limitations, risks, security and privacy concerns of performing an evaluation and management service by telephone and the availability of in person appointments. I also discussed with the patient that there may be a patient responsible charge related to this service. The patient expressed understanding and agreed to proceed.  Location patient: home Location provider: work or home office Participants present for the call: patient, provider Patient did not have a visit in the prior 7 days to address this/these issue(s).   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  Is being evaluated today for an acute issue that started roughly 24 hours ago.  Symptoms include chills, fatigue, headache, nasal congestion, and sneezing.  Tested himself for COVID at home yesterday when his symptoms started and it was negative.  Has been using Excedrin and DayQuil with some mild relief in his symptoms.  He denies fevers, diarrhea, nausea, or vomiting. He may feel a little better today then he did yesterday    Observations/Objective: Patient sounds cheerful and well on the phone. I do not appreciate any SOB. Speech and thought processing are grossly intact. Patient reported vitals:  Assessment and Plan: 1. Viral infection -We will have him home test again tomorrow for COVID-19.  Likely some other viral symptoms.  Encourage conservative measures such as continued over-the-counter medication, rest, and hydration.   Follow-up if symptoms worsen or fever develops  Follow Up Instructions:  I did not refer this patient for an OV in the next 24 hours for this/these issue(s).  I discussed the assessment and treatment plan with the patient. The patient was provided an opportunity to ask questions and all were answered. The patient agreed with the plan and demonstrated an understanding of the instructions.   The patient was advised to call back or seek an in-person evaluation if the symptoms worsen or if the condition fails to improve as anticipated.  I provided  10 minutes of non-face-to-face time during this encounter.   [name], NP

## 2020-10-11 ENCOUNTER — Emergency Department (HOSPITAL_COMMUNITY)
Admission: EM | Admit: 2020-10-11 | Discharge: 2020-10-11 | Disposition: A | Discharge status: Home / Self Care | Payer: 59 | Attending: Emergency Medicine | Admitting: Emergency Medicine

## 2020-10-11 ENCOUNTER — Encounter (HOSPITAL_COMMUNITY): Payer: Self-pay

## 2020-10-11 ENCOUNTER — Other Ambulatory Visit: Payer: Self-pay

## 2020-10-11 ENCOUNTER — Emergency Department (HOSPITAL_COMMUNITY): Payer: 59

## 2020-10-11 DIAGNOSIS — I959 Hypotension, unspecified: Secondary | ICD-10-CM | POA: Diagnosis not present

## 2020-10-11 DIAGNOSIS — J45909 Unspecified asthma, uncomplicated: Secondary | ICD-10-CM | POA: Insufficient documentation

## 2020-10-11 DIAGNOSIS — Z7982 Long term (current) use of aspirin: Secondary | ICD-10-CM | POA: Diagnosis not present

## 2020-10-11 DIAGNOSIS — E860 Dehydration: Secondary | ICD-10-CM | POA: Insufficient documentation

## 2020-10-11 DIAGNOSIS — J0100 Acute maxillary sinusitis, unspecified: Secondary | ICD-10-CM | POA: Diagnosis not present

## 2020-10-11 DIAGNOSIS — R059 Cough, unspecified: Secondary | ICD-10-CM | POA: Diagnosis present

## 2020-10-11 LAB — COMPREHENSIVE METABOLIC PANEL
ALT: 23 U/L (ref 0–44)
AST: 25 U/L (ref 15–41)
Albumin: 3.8 g/dL (ref 3.5–5.0)
Alkaline Phosphatase: 65 U/L (ref 38–126)
Anion gap: 8 (ref 5–15)
BUN: 17 mg/dL (ref 6–20)
CO2: 26 mmol/L (ref 22–32)
Calcium: 8.8 mg/dL — ABNORMAL LOW (ref 8.9–10.3)
Chloride: 102 mmol/L (ref 98–111)
Creatinine, Ser: 1.64 mg/dL — ABNORMAL HIGH (ref 0.61–1.24)
GFR, Estimated: 48 mL/min — ABNORMAL LOW (ref >60)
Glucose, Bld: 115 mg/dL — ABNORMAL HIGH (ref 70–99)
Potassium: 3.9 mmol/L (ref 3.5–5.1)
Sodium: 136 mmol/L (ref 135–145)
Total Bilirubin: 0.6 mg/dL (ref 0.3–1.2)
Total Protein: 7.3 g/dL (ref 6.5–8.1)

## 2020-10-11 LAB — CBC WITH DIFFERENTIAL/PLATELET
Abs Immature Granulocytes: 0.02 10*3/uL (ref 0.00–0.07)
Basophils Absolute: 0 10*3/uL (ref 0.0–0.1)
Basophils Relative: 1 %
Eosinophils Absolute: 0.1 10*3/uL (ref 0.0–0.5)
Eosinophils Relative: 1 %
HCT: 47.1 % (ref 39.0–52.0)
Hemoglobin: 15.1 g/dL (ref 13.0–17.0)
Immature Granulocytes: 0 %
Lymphocytes Relative: 17 %
Lymphs Abs: 1 10*3/uL (ref 0.7–4.0)
MCH: 28.4 pg (ref 26.0–34.0)
MCHC: 32.1 g/dL (ref 30.0–36.0)
MCV: 88.7 fL (ref 80.0–100.0)
Monocytes Absolute: 1 10*3/uL (ref 0.1–1.0)
Monocytes Relative: 18 %
Neutro Abs: 3.7 10*3/uL (ref 1.7–7.7)
Neutrophils Relative %: 63 %
Platelets: 196 10*3/uL (ref 150–400)
RBC: 5.31 MIL/uL (ref 4.22–5.81)
RDW: 13.7 % (ref 11.5–15.5)
WBC: 5.8 10*3/uL (ref 4.0–10.5)
nRBC: 0 % (ref 0.0–0.2)

## 2020-10-11 LAB — URINALYSIS, ROUTINE W REFLEX MICROSCOPIC
Bacteria, UA: NONE SEEN
Bilirubin Urine: NEGATIVE
Glucose, UA: NEGATIVE mg/dL
Ketones, ur: NEGATIVE mg/dL
Leukocytes,Ua: NEGATIVE
Nitrite: NEGATIVE
Protein, ur: NEGATIVE mg/dL
Specific Gravity, Urine: 1.012 (ref 1.005–1.030)
pH: 6 (ref 5.0–8.0)

## 2020-10-11 LAB — TROPONIN I (HIGH SENSITIVITY): Troponin I (High Sensitivity): 3 ng/L (ref <18)

## 2020-10-11 MED ORDER — SODIUM CHLORIDE 0.9 % IV BOLUS
1000.0000 mL | Freq: Once | INTRAVENOUS | Status: AC
  Administered 2020-10-11: 1000 mL via INTRAVENOUS

## 2020-10-11 MED ORDER — DOXYCYCLINE HYCLATE 100 MG PO CAPS: 100.0000 mg | ORAL_CAPSULE | Freq: Two times a day (BID) | ORAL | 0 refills | Status: DC

## 2020-10-11 NOTE — ED Provider Notes (Signed)
[facility] DEPT Provider Note   CSN: [id number] Arrival date & time: 10/11/20  1934     History Chief Complaint  Patient presents with  . Hypotension    Gerald Johnson is a 60 y.o. male.  Patient states that he has had rhinitis along with a cough congestion and fever and felt weak and dizzy and had a low blood pressure at the urgent care today  The history is provided by the patient and medical records. No language interpreter was used.  Weakness Severity:  Mild Onset quality:  Gradual Timing:  Intermittent Progression:  Waxing and waning Chronicity:  New Context: not alcohol use   Relieved by:  Nothing Worsened by:  Nothing Associated symptoms: cough   Associated symptoms: no abdominal pain, no chest pain, no diarrhea, no frequency, no headaches and no seizures        Past Medical History:  Diagnosis Date  . Acute prostatitis 12/02/2007  . ALLERGIC RHINITIS 02/26/2007  . Asthma   . 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 Headache behind the eyes 07/03/2010   New problem noted 27 2012   . Hemorrhoids   . HYPERLIPIDEMIA 02/26/2007  . Migraines   . Sciatica 05/04/2008  . SINUSITIS, RECURRENT 02/28/2010    Patient Active Problem List   Diagnosis Date Noted  . Migraine 09/17/2017  . Gout 01/27/2015  . 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 Mixed hyperlipidemia 02/26/2007  . SINUSITIS, ACUTE NOS 02/26/2007  . Allergic rhinitis 02/26/2007    Past Surgical History:  Procedure Laterality Date  . WISDOM TOOTH EXTRACTION         Family History  Problem Relation Age of Onset  . Diabetes Brother   . Colon cancer Neg Hx   . Esophageal cancer Neg Hx   . Rectal cancer Neg Hx   . Stomach cancer Neg Hx   . Gout Neg Hx     Social History   Tobacco Use  . Smoking status: Never Smoker  . Smokeless tobacco: Never Used  Substance Use Topics  . Alcohol use: Yes    Alcohol/week: 7.0 standard drinks     Types: 7 Cans of beer per week    Comment: daily  . Drug use: No    Home Medications Prior to Admission medications   Medication Sig Start Date End Date Taking? Authorizing Provider  amitriptyline (ELAVIL) 50 MG tablet Take 50 mg by mouth at bedtime.   Yes [provider]  Aspirin-Acetaminophen-Caffeine (EXCEDRIN PO) Take 1 tablet by mouth 2 (two) times daily as needed (headache).   Yes [provider]  cetirizine (ZYRTEC) 10 MG tablet Take 10 mg by mouth daily.   Yes [provider]  diphenhydrAMINE-Phenylephrine (THERAFLU COLD/COUGH NIGHTTIME PO) Take 30 mLs by mouth at bedtime as needed (head cold).   Yes [provider]  doxycycline (VIBRAMYCIN) 100 MG capsule Take 1 capsule (100 mg total) by mouth 2 (two) times daily. One po bid x 7 days 10/11/20  Yes Milton Ferguson, MD  Polyethyl Glycol-Propyl Glycol (SYSTANE) 0.4-0.3 % SOLN Apply 1 drop to eye daily as needed (dry eyes).   Yes [provider]  SUMAtriptan (IMITREX) 50 MG tablet TAKE 1 TABLET BY MOUTH EVERY 2 HOURS AS NEEDED FOR MIGRAINE.MAY REPEAT IN 2 HOURS IF HEADACHE PERSISTS OR RECURS. 02/29/20  Yes Nafziger, Tommi Rumps, NP  tamsulosin (FLOMAX) 0.4 MG CAPS capsule Take 1 capsule (0.4 mg total) by mouth daily after breakfast. 10/03/20  Yes Nafziger, Tommi Rumps, NP  amitriptyline (ELAVIL) 50 MG tablet Take 1 tablet (50 mg total) by mouth at bedtime. 12/10/18 02/25/20  Nafziger, Tommi Rumps, NP  sildenafil (VIAGRA) 50 MG tablet Take 1 tablet (50 mg total) by mouth daily as needed for erectile dysfunction (take 1/2 to 1 pill as needed). Patient not taking: Reported on 10/11/2020 12/28/19   [name], NP    Allergies    Patient has no known allergies.  Review of Systems   Review of Systems  Constitutional: Negative for appetite change and fatigue.  HENT: Negative for congestion, ear discharge and sinus pressure.   Eyes: Negative for discharge.  Respiratory: Positive for cough.   Cardiovascular: Negative for  chest pain.  Gastrointestinal: Negative for abdominal pain and diarrhea.  Genitourinary: Negative for frequency and hematuria.  Musculoskeletal: Negative for back pain.  Skin: Negative for rash.  Neurological: Positive for weakness. Negative for seizures and headaches.  Psychiatric/Behavioral: Negative for hallucinations.    Physical Exam Updated Vital Signs BP 108/64   Pulse 84   Temp 98 F (36.7 C) (Oral)   Resp 16   Ht 6\' 2"  (1.88 m)   Wt 115.8 kg   SpO2 99%   BMI 32.79 kg/m   Physical Exam Vitals and nursing note reviewed.  Constitutional:      Appearance: He is well-developed.  HENT:     Head: Normocephalic.     Mouth/Throat:     Comments: Tender maxillary sinus Eyes:     General: No scleral icterus.    Conjunctiva/sclera: Conjunctivae normal.  Neck:     Thyroid: No thyromegaly.  Cardiovascular:     Rate and Rhythm: Normal rate and regular rhythm.     Heart sounds: No murmur heard. No friction rub. No gallop.   Pulmonary:     Breath sounds: No stridor. No wheezing or rales.  Chest:     Chest wall: No tenderness.  Abdominal:     General: There is no distension.     Tenderness: There is no abdominal tenderness. There is no rebound.  Musculoskeletal:        General: Normal range of motion.     Cervical back: Neck supple.  Lymphadenopathy:     Cervical: No cervical adenopathy.  Skin:    Findings: No erythema or rash.  Neurological:     Mental Status: He is alert and oriented to person, place, and time.     Motor: No abnormal muscle tone.     Coordination: Coordination normal.  Psychiatric:        Behavior: Behavior normal.     ED Results / Procedures / Treatments   Labs (all labs ordered are listed, but only abnormal results are displayed) Labs Reviewed  COMPREHENSIVE METABOLIC PANEL - Abnormal; Notable for the following components:      Result Value   Glucose, Bld 115 (*)    Creatinine, Ser 1.64 (*)    Calcium 8.8 (*)    GFR, Estimated 48 (*)     All other components within normal limits  URINALYSIS, ROUTINE W REFLEX MICROSCOPIC - Abnormal; Notable for the following components:   Hgb urine dipstick SMALL (*)    All other components within normal limits  CBC WITH DIFFERENTIAL/PLATELET  TROPONIN I (HIGH SENSITIVITY)    EKG None  Radiology DG Chest Port 1 View  Result Date: 10/11/2020 CLINICAL DATA:  Weakness EXAM: PORTABLE CHEST 1 VIEW COMPARISON:  11/03/2006 FINDINGS: The heart size and mediastinal contours are within normal limits. Both lungs are clear. The visualized  skeletal structures are unremarkable. Subsegmental atelectasis left base. IMPRESSION: No active disease. Electronically Signed   By: Donavan Foil M.D.   On: 10/11/2020 20:30    Procedures Procedures   Medications Ordered in ED Medications  sodium chloride 0.9 % bolus 1,000 mL (0 mLs Intravenous Stopped 10/11/20 2215)    ED Course  I have reviewed the triage vital signs and the nursing notes.  Pertinent labs & imaging results that were available during my care of the patient were reviewed by me and considered in my medical decision making (see chart for details).    MDM Rules/Calculators/A&P                          Patient with dehydration and sinus infection possible bronchitis.  Patient feels much better with fluids he will follow-up with his PCP and is started on some doxycycline Final Clinical Impression(s) / ED Diagnoses Final diagnoses:  Dehydration  Subacute maxillary sinusitis    Rx / DC Orders ED Discharge Orders         Ordered    doxycycline (VIBRAMYCIN) 100 MG capsule  2 times daily        10/11/20 2306           Milton Ferguson, MD 10/15/20 1119

## 2020-10-11 NOTE — ED Triage Notes (Signed)
Patient BIB [facility] from an urgent care. Patient went to urgent care feeling sweaty, runny nose, chills. He took covid test and it was negative at home. When he got to urgent care he got so hot and dizzy and his BP 86/50. HR 120.  EMS gave 545mL fluid 140/80 HR 90 Dizziness and sweatiness went away

## 2020-10-11 NOTE — Discharge Instructions (Addendum)
Drink plenty of fluids follow-up with your doctor next week for recheck °

## 2020-10-13 ENCOUNTER — Telehealth: Payer: Self-pay

## 2020-10-13 ENCOUNTER — Ambulatory Visit: Payer: 59 | Admitting: Family Medicine

## 2020-10-13 NOTE — Telephone Encounter (Signed)
called pt this mornning to review his chart for the visit with Dr Sarajane Jews this morning, pt stated that he  went to Urgent Care and was prescribed Antibiotics which he is taking, pt requested to cancel todays appointment//nk

## 2020-11-01 ENCOUNTER — Other Ambulatory Visit: Payer: Self-pay

## 2020-11-02 ENCOUNTER — Ambulatory Visit: Payer: 59 | Admitting: Adult Health

## 2020-11-02 ENCOUNTER — Encounter: Payer: Self-pay | Admitting: Adult Health

## 2020-11-02 VITALS — BP 132/90 | HR 96 | Temp 98.1°F | Ht 74.0 in | Wt 252.0 lb

## 2020-11-02 DIAGNOSIS — M7021 Olecranon bursitis, right elbow: Secondary | ICD-10-CM | POA: Diagnosis not present

## 2020-11-02 MED ORDER — METHYLPREDNISOLONE ACETATE 40 MG/ML IJ SUSP
40.0000 mg | Freq: Once | INTRAMUSCULAR | Status: AC
  Administered 2020-11-02: 40 mg via INTRA_ARTICULAR

## 2020-11-02 MED ORDER — METHYLPREDNISOLONE ACETATE 40 MG/ML IJ SUSP: 40.0000 mg | Freq: Once | INTRAMUSCULAR | Status: DC

## 2020-11-02 NOTE — Progress Notes (Signed)
Subjective:    Patient ID: Gerald Johnson, male    DOB: May 28, 1960, 60 y.o.   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  He presents to the office today for an acute issue of olecranon bursitis of right elbow. Reports swelling over the last few days. Has experienced this in the past with his left elbow. Denies redness, warmth, or tenderness. Denies injury   Review of Systems See HPI   Past Medical History:  Diagnosis Date   Acute prostatitis 12/02/2007   ALLERGIC RHINITIS 02/26/2007   Asthma    ERECTILE DYSFUNCTION, NON-ORGANIC, MILD 11/08/2008   Headache behind the eyes 07/03/2010   New problem noted 27 2012    Hemorrhoids    HYPERLIPIDEMIA 02/26/2007   Migraines    Sciatica 05/04/2008   SINUSITIS, RECURRENT 02/28/2010    Social History   Socioeconomic History   Marital status: Single    Spouse name: Not on file   Number of children: Not on file   Years of education: Not on file   Highest education level: Not on file  Occupational History   Not on file  Tobacco Use   Smoking status: Never   Smokeless tobacco: Never  Substance and Sexual Activity   Alcohol use: Yes    Alcohol/week: 7.0 standard drinks    Types: 7 Cans of beer per week    Comment: daily   Drug use: No   Sexual activity: Yes    Birth control/protection: Condom  Other Topics Concern   Not on file  Social History Narrative   Works as a Nature conservation officer.    Not married    No kids    He likes to go to the gym. Likes to go to go to restaurants.    Social Determinants of Health   Financial Resource Strain: Not on file  Food Insecurity: Not on file  Transportation Needs: Not on file  Physical Activity: Not on  file  Stress: Not on file  Social Connections: Not on file  Intimate Partner Violence: Not on file    Past Surgical History:  Procedure Laterality Date   WISDOM TOOTH EXTRACTION      Family History  Problem Relation Age of Onset   Diabetes Brother    Colon cancer Neg Hx    Esophageal cancer Neg Hx    Rectal cancer Neg Hx    Stomach cancer Neg Hx    Gout Neg Hx     No Known Allergies  Current Outpatient Medications on File Prior to Visit  Medication Sig Dispense Refill   amitriptyline (ELAVIL) 50 MG tablet Take 1 tablet (50 mg total) by mouth at bedtime. 90 tablet 0   amitriptyline (ELAVIL) 50 MG tablet Take 50 mg by mouth at bedtime.     Aspirin-Acetaminophen-Caffeine (EXCEDRIN PO) Take 1 tablet by mouth 2 (two) times daily as needed (headache).     cetirizine (ZYRTEC) 10 MG tablet Take 10 mg by mouth daily.     diphenhydrAMINE-Phenylephrine (THERAFLU COLD/COUGH NIGHTTIME PO) Take 30 mLs by mouth at bedtime as needed (head cold).     doxycycline (VIBRAMYCIN) 100 MG capsule Take 1 capsule (100 mg total) by mouth 2 (two) times daily. One po bid x 7 days 14 capsule 0   Polyethyl Glycol-Propyl Glycol (  SYSTANE) 0.4-0.3 % SOLN Apply 1 drop to eye daily as needed (dry eyes).     sildenafil (VIAGRA) 50 MG tablet Take 1 tablet (50 mg total) by mouth daily as needed for erectile dysfunction (take 1/2 to 1 pill as needed). (Patient not taking: Reported on 10/11/2020) 10 tablet 6   SUMAtriptan (IMITREX) 50 MG tablet TAKE 1 TABLET BY MOUTH EVERY 2 HOURS AS NEEDED FOR MIGRAINE.MAY REPEAT IN 2 HOURS IF HEADACHE PERSISTS OR RECURS. 10 tablet 6   tamsulosin (FLOMAX) 0.4 MG CAPS capsule Take 1 capsule (0.4 mg total) by mouth daily after breakfast. 90 capsule 0   No current facility-administered medications on file prior to visit.    BP 132/90   Pulse 96   Temp 98.1 F (36.7 C) (Oral)   Ht 6\' 2"  (1.88 m)   Wt 252 lb (114.3 kg)   SpO2 95%   BMI 32.35 kg/m       Objective:   Physical  Exam Vitals and nursing note reviewed.  Constitutional:      Appearance: Normal appearance.  Musculoskeletal:        General: Swelling (left olecronon bursa) present. No tenderness. Normal range of motion.  Skin:    General: Skin is warm and dry.  Neurological:     Mental Status: He is alert.      Assessment & Plan:   1. Olecranon bursitis of right elbow - verbal consent obtained for drainage. Area cleaned with betadine and alcohol. Using cold spray, anesthesia was obtained. Using a 22 gauge needle, approx 4.5 ml of red clear liquid was removed. His bursa was then injected with 40 mg of depo medrol. Patient tolerated procedure well.  - methylPREDNISolone acetate (DEPO-MEDROL) injection 40 mg - Follow up as needed  [name], NP

## 2020-12-22 ENCOUNTER — Telehealth: Payer: Self-pay | Admitting: Adult Health

## 2020-12-22 NOTE — Telephone Encounter (Signed)
Pt is calling in needing a refill on prednisone (DELTASONE) 20 MG for his flare up of gout.  Pharm:  [facility] on [address].

## 2020-12-26 ENCOUNTER — Other Ambulatory Visit: Payer: Self-pay

## 2020-12-26 ENCOUNTER — Ambulatory Visit: Payer: 59 | Admitting: Family Medicine

## 2020-12-26 ENCOUNTER — Other Ambulatory Visit: Payer: Self-pay | Admitting: Adult Health

## 2020-12-26 ENCOUNTER — Encounter: Payer: Self-pay | Admitting: Family Medicine

## 2020-12-26 VITALS — BP 160/90 | HR 85 | Temp 97.8°F | Wt 255.7 lb

## 2020-12-26 DIAGNOSIS — M7022 Olecranon bursitis, left elbow: Secondary | ICD-10-CM | POA: Diagnosis not present

## 2020-12-26 DIAGNOSIS — M109 Gout, unspecified: Secondary | ICD-10-CM

## 2020-12-26 MED ORDER — METHYLPREDNISOLONE ACETATE 40 MG/ML IJ SUSP
40.0000 mg | Freq: Once | INTRAMUSCULAR | Status: AC
  Administered 2020-12-26: 40 mg via INTRA_ARTICULAR

## 2020-12-26 MED ORDER — PREDNISONE 20 MG PO TABS: ORAL_TABLET | ORAL | 0 refills | Status: DC

## 2020-12-26 MED ORDER — METHYLPREDNISOLONE ACETATE 40 MG/ML IJ SUSP: 40.0000 mg | Freq: Once | INTRAMUSCULAR | Status: DC

## 2020-12-26 NOTE — Progress Notes (Signed)
Established Patient Office Visit  Subjective:  Patient ID: Gerald Johnson, male    DOB: [birth year]  Age: 60 y.o. MRN: [id number]  CC:  Chief Complaint  Patient presents with   Gout    Gout flare up in the knee    HPI JOH DROGE presents for left elbow pain and swelling and acute right knee pain.  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  He basically is requesting removal of fluid and steroid injection.  He had similar done couple months ago.  Denies any injury.  Not much pain associated with the left elbow.  No warmth or erythema.  No fever.  Acute right knee pain.  He has noted  a bit of warmth.  Minimal if any swelling.  No injury.  History of gout and he states this is typical of similar flareups in the past.  He does drink beer somewhat regularly and thinks that may be a precipitant.  No locking or giving way of the knee.  Past Medical History:  Diagnosis Date   Acute prostatitis 12/02/2007   ALLERGIC RHINITIS 02/26/2007   Asthma    ERECTILE DYSFUNCTION, NON-ORGANIC, MILD 11/08/2008   Headache behind the eyes 07/03/2010   New problem noted 27 2012    Hemorrhoids    HYPERLIPIDEMIA 02/26/2007   Migraines    Sciatica 05/04/2008   SINUSITIS, RECURRENT 02/28/2010    Past Surgical History:  Procedure Laterality Date   WISDOM TOOTH EXTRACTION      Family History  Problem Relation Age of Onset   Diabetes Brother    Colon cancer Neg Hx    Esophageal cancer Neg Hx    Rectal cancer Neg Hx    Stomach cancer Neg Hx    Gout Neg Hx     Social History   Socioeconomic History   Marital status: Single    Spouse name: Not on file   Number of children: Not on file   Years of education: Not on file   Highest education level: Not on file  Occupational History   Not on file  Tobacco Use   Smoking status: Never   Smokeless tobacco: Never  Substance and Sexual Activity   Alcohol use: Yes    Alcohol/week: 7.0 standard drinks    Types: 7 Cans of beer per week     Comment: daily   Drug use: No   Sexual activity: Yes    Birth control/protection: Condom  Other Topics Concern   Not on file  Social History Narrative   Works as a Nature conservation officer.    Not married    No kids    He likes to go to the gym. Likes to go to go to restaurants.    Social Determinants of Health   Financial Resource Strain: Not on file  Food Insecurity: Not on file  Transportation Needs: Not on file  Physical Activity: Not on file  Stress: Not on file  Social Connections: Not on file  Intimate Partner Violence: Not on file    Outpatient Medications Prior to Visit  Medication Sig Dispense Refill   amitriptyline (ELAVIL) 50 MG tablet Take 50 mg by mouth at bedtime.     Aspirin-Acetaminophen-Caffeine (EXCEDRIN PO) Take 1 tablet by mouth 2 (two) times daily as needed (headache).     cetirizine (ZYRTEC) 10 MG tablet Take 10 mg by mouth daily.     diphenhydrAMINE-Phenylephrine (THERAFLU COLD/COUGH NIGHTTIME PO) Take 30 mLs by mouth at bedtime as  needed (head cold).     doxycycline (VIBRAMYCIN) 100 MG capsule Take 1 capsule (100 mg total) by mouth 2 (two) times daily. One po bid x 7 days 14 capsule 0   Polyethyl Glycol-Propyl Glycol (SYSTANE) 0.4-0.3 % SOLN Apply 1 drop to eye daily as needed (dry eyes).     sildenafil (VIAGRA) 50 MG tablet Take 1 tablet (50 mg total) by mouth daily as needed for erectile dysfunction (take 1/2 to 1 pill as needed). 10 tablet 6   SUMAtriptan (IMITREX) 50 MG tablet TAKE 1 TABLET BY MOUTH EVERY 2 HOURS AS NEEDED FOR MIGRAINE.MAY REPEAT IN 2 HOURS IF HEADACHE PERSISTS OR RECURS. 10 tablet 6   tamsulosin (FLOMAX) 0.4 MG CAPS capsule Take 1 capsule (0.4 mg total) by mouth daily after breakfast. 90 capsule 0   amitriptyline (ELAVIL) 50 MG tablet Take 1 tablet (50 mg total) by mouth at bedtime. 90 tablet 0   No facility-administered medications prior to visit.    No Known Allergies  ROS Review of Systems  Constitutional:  Negative for chills  and fever.     Objective:    Physical Exam Vitals reviewed.  Cardiovascular:     Rate and Rhythm: Normal rate and regular rhythm.  Musculoskeletal:     Comments: Left elbow reveals full range of motion.  He has swelling over the olecranon bursa.  No erythema.  No significant warmth.  Nontender.  Right knee reveals full range of motion.  No effusion.  Slight warmth.  No erythema.    BP (!) 160/90 (BP Location: Left Arm, Patient Position: Sitting, Cuff Size: Normal)   Pulse 85   Temp 97.8 F (36.6 C) (Oral)   Wt 255 lb 11.2 oz (116 kg)   SpO2 96%   BMI 32.83 kg/m  Wt Readings from Last 3 Encounters:  12/26/20 255 lb 11.2 oz (116 kg)  11/02/20 252 lb (114.3 kg)  10/11/20 255 lb 6.4 oz (115.8 kg)     Health Maintenance Due  Topic Date Due   Hepatitis C Screening  Never done   Zoster Vaccines- Shingrix (1 of 2) Never done   COVID-19 Vaccine (3 - Booster) 01/24/2020   INFLUENZA VACCINE  12/25/2020    There are no preventive care reminders to display for this patient.  Lab Results  Component Value Date   TSH 0.60 03/18/2018   Lab Results  Component Value Date   WBC 5.8 10/11/2020   HGB 15.1 10/11/2020   HCT 47.1 10/11/2020   MCV 88.7 10/11/2020   PLT 196 10/11/2020   Lab Results  Component Value Date   NA 136 10/11/2020   K 3.9 10/11/2020   CO2 26 10/11/2020   GLUCOSE 115 (H) 10/11/2020   BUN 17 10/11/2020   CREATININE 1.64 (H) 10/11/2020   BILITOT 0.6 10/11/2020   ALKPHOS 65 10/11/2020   AST 25 10/11/2020   ALT 23 10/11/2020   PROT 7.3 10/11/2020   ALBUMIN 3.8 10/11/2020   CALCIUM 8.8 (L) 10/11/2020   ANIONGAP 8 10/11/2020   GFR 76.48 03/18/2018   Lab Results  Component Value Date   CHOL 218 (H) 03/18/2018   Lab Results  Component Value Date   HDL 40.90 03/18/2018   Lab Results  Component Value Date   LDLCALC 161 (H) 08/09/2015   Lab Results  Component Value Date   TRIG 209.0 (H) 03/18/2018   Lab Results  Component Value Date   CHOLHDL  5 03/18/2018   Lab Results  Component Value Date  HGBA1C 5.6 03/18/2018      Assessment & Plan:   #1 acute right knee pain.  Question acute gout.  Patient does have some mild warmth but no erythema.  -Discussed preventative issues with gout.  Discussed dietary triggers -Prednisone 20 mg 2 tablets once daily for 6 days  #2 acute left olecranon bursitis.  No reported injury.  We discussed that acute elbow bursitis can be associated with gout but no evidence for tophaceous changes.  No signs of infection. We discussed risk and benefits of olecranon bursa aspiration with steroid injection including low risk of infection and patient consented.  Using 23-gauge 1 inch needle we aspirated 7 cc of serosanguineous fluid.  Patient tolerated well.  We then injected 40 mg of Depo-Medrol into the left olecranon bursa sac.  Coban applied for pressure dressing. -Stressed importance of keeping pressure off the left elbow is much as possible -He is aware of high risk of recurrence of olecranon bursa swelling   Meds ordered this encounter  Medications   predniSONE (DELTASONE) 20 MG tablet    Sig: Take two tablets by mouth once daily for 6 days.    Dispense:  12 tablet    Refill:  0   methylPREDNISolone acetate (DEPO-MEDROL) injection 40 mg    Follow-up: No follow-ups on file.    Carolann Littler, MD

## 2020-12-26 NOTE — Telephone Encounter (Signed)
Ok for refill? Please advise 

## 2020-12-30 ENCOUNTER — Other Ambulatory Visit: Payer: Self-pay | Admitting: Adult Health

## 2020-12-30 DIAGNOSIS — N401 Enlarged prostate with lower urinary tract symptoms: Secondary | ICD-10-CM

## 2021-04-24 ENCOUNTER — Other Ambulatory Visit: Payer: Self-pay

## 2021-04-24 ENCOUNTER — Telehealth (INDEPENDENT_AMBULATORY_CARE_PROVIDER_SITE_OTHER): Payer: 59 | Admitting: Family Medicine

## 2021-04-24 ENCOUNTER — Encounter: Payer: Self-pay | Admitting: Family Medicine

## 2021-04-24 DIAGNOSIS — M25562 Pain in left knee: Secondary | ICD-10-CM

## 2021-04-24 NOTE — Progress Notes (Signed)
Virtual Visit via Telephone Note  I connected with Gerald Johnson on 04/24/21 at  6:00 PM EST by telephone and verified that I am speaking with the correct person using two identifiers.   I discussed the limitations, risks, security and privacy concerns of performing an evaluation and management service by telephone and the availability of in person appointments. I also discussed with the patient that there may be a patient responsible charge related to this service. The patient expressed understanding and agreed to proceed.  Location patient: home, [place] Location provider: work or home office Participants present for the call: patient, provider Patient did not have a visit with me in the prior 7 days to address this/these issue(s).   History of Present Illness:  Acute telemedicine visit for L knee pain: -reports occurs about 1-2 times per year in knees usually -Onset: right after thanksgiving about 2 days ago -Symptoms include: soreness, swelling in the L knee, feels warm around the knee -Denies: fevers, chills, malaise, swelling elsewhere, injury or wound on the knee -Has tried:some aleve - he thinks this is gout but has not had uric acid test or tap from what I can see -Pertinent past medical history: significant OA of the knee on xray earlier this year -Pertinent medication allergies:No Known Allergies   Past Medical History:  Diagnosis Date   Acute prostatitis 12/02/2007   ALLERGIC RHINITIS 02/26/2007   Asthma    ERECTILE DYSFUNCTION, NON-ORGANIC, MILD 11/08/2008   Headache behind the eyes 07/03/2010   New problem noted 27 2012    Hemorrhoids    HYPERLIPIDEMIA 02/26/2007   Migraines    Sciatica 05/04/2008   SINUSITIS, RECURRENT 02/28/2010     Observations/Objective: Patient sounds cheerful and well on the phone. I do not appreciate any SOB. Speech and thought processing are grossly intact. Patient reported vitals:  Assessment and Plan:  Acute pain of left knee  -we  discussed possible serious and likely etiologies, options for evaluation and workup, limitations of telemedicine visit vs in person visit, treatment, treatment risks and precautions. Pt prefers to treat via telemedicine empirically rather than in person at this moment. Query OA versus other. Given unsure dx, xray findings, degrees of symptoms advised inperson eval and discussed options. May need knee inj. He prefers to see specialist and has opted to go to [facility] tomorrow after discussion options.   Follow Up Instructions:  I did not refer this patient for an OV with me in the next 24 hours for this/these issue(s).  I discussed the assessment and treatment plan with the patient. The patient was provided an opportunity to ask questions and all were answered. The patient agreed with the plan and demonstrated an understanding of the instructions.   I spent 14 minutes on the date of this visit in the care of this patient. See summary of tasks completed to properly care for this patient in the detailed notes above which also included counseling of above, review of PMH, medications, allergies, evaluation of the patient and ordering and/or  instructing patient on testing and care options.     Lucretia Kern, DO

## 2021-04-24 NOTE — Patient Instructions (Signed)
Ice and rest this evening. Aleve 1-2 tablets every 12 hours as needed for pain.  Seek inperson care as we discussed in the morning at the Orthopedic clinic.   I hope you are feeling better soon!  Seek in person care promptly if your symptoms worsen or new concerns arise.  It was nice to meet you today. I help [place] out with telemedicine visits on Tuesdays and Thursdays and am available for visits on those days. If you have any concerns or questions following this visit please schedule a follow up visit with your Primary Care doctor or seek care at a local urgent care clinic to avoid delays in care.

## 2021-04-30 ENCOUNTER — Telehealth: Payer: Self-pay | Admitting: Adult Health

## 2021-04-30 NOTE — Telephone Encounter (Signed)
Patient calling in with respiratory symptoms: Shortness of breath, chest pain, palpitations or other red words send to Triage  Does the patient have a fever over 100, cough, congestion, sore throat, runny nose, lost of taste/smell within the last 5 days (please list symptoms that patient has)? Congestion and headaches  Have you tested for Covid in the last 5 days? Yes   If yes, was it positive OR negative [x] ? If positive in the last 5 days, please schedule virtual visit now. If negative, schedule for an in person OV with the next available provider if PCP has no openings. Please also let patient know they will be tested again (follow the script below)  "you will have to arrive 99mins prior to your appt time to be Covid tested. Please park in back of office at the cone & call [phone] to let the staff know you have arrived. A staff member will meet you at your car to do a rapid covid test. Once the test has resulted you will be notified by phone of your results to determine if appt will remain an in person visit or be converted to a virtual/phone visit. If you arrive less than 72mins before your appt time, your visit will be automatically converted to virtual & any recommended testing will happen AFTER the visit."   [place]  If no availability for virtual visit in office,  please schedule another [place] office  If no availability at another [place] office, please instruct patient that they can schedule an evisit or virtual visit through their mychart account. Visits up to 8pm  patients can be seen in office 5 days after positive COVID test

## 2021-05-01 ENCOUNTER — Encounter: Payer: Self-pay | Admitting: Family Medicine

## 2021-05-01 ENCOUNTER — Ambulatory Visit: Payer: 59 | Admitting: Family Medicine

## 2021-05-01 VITALS — BP 152/100 | HR 83 | Temp 97.7°F | Ht 74.0 in | Wt 262.9 lb

## 2021-05-01 DIAGNOSIS — R0989 Other specified symptoms and signs involving the circulatory and respiratory systems: Secondary | ICD-10-CM

## 2021-05-01 LAB — POC COVID19 BINAXNOW: SARS Coronavirus 2 Ag: NEGATIVE

## 2021-05-01 MED ORDER — PREDNISONE 20 MG PO TABS: ORAL_TABLET | ORAL | 0 refills | Status: DC

## 2021-05-01 MED ORDER — AZITHROMYCIN 250 MG PO TABS: ORAL_TABLET | ORAL | 0 refills | Status: AC

## 2021-05-01 MED ORDER — ALBUTEROL SULFATE HFA 108 (90 BASE) MCG/ACT IN AERS: 2.0000 | INHALATION_SPRAY | RESPIRATORY_TRACT | 0 refills | Status: AC | PRN

## 2021-05-01 NOTE — Progress Notes (Signed)
Established Patient Office Visit  Subjective:  Patient ID: Gerald Johnson, male    DOB: [birth year]  Age: 60 y.o. MRN: [id number]  CC:  Chief Complaint  Patient presents with   Nasal Congestion    Patient complains of nasal congestion,x3 days,    Headache    Patient complains of headaches,x3 days,     HPI Gerald Johnson presents for acute upper respiratory symptoms which started Saturday.  He has had some nasal congestion and also cough.  Cough mostly productive of clear sputum.  No fever.  He does feel like he has been wheezing some.  He states he has history of mild asthma in childhood.  Currently does not have inhalers at home.  No known sick contacts.  No sore throat.  Denies any nausea, vomiting, or diarrhea.  Past Medical History:  Diagnosis Date   Acute prostatitis 12/02/2007   ALLERGIC RHINITIS 02/26/2007   Asthma    ERECTILE DYSFUNCTION, NON-ORGANIC, MILD 11/08/2008   Headache behind the eyes 07/03/2010   New problem noted 27 2012    Hemorrhoids    HYPERLIPIDEMIA 02/26/2007   Migraines    Sciatica 05/04/2008   SINUSITIS, RECURRENT 02/28/2010    Past Surgical History:  Procedure Laterality Date   WISDOM TOOTH EXTRACTION      Family History  Problem Relation Age of Onset   Diabetes Brother    Colon cancer Neg Hx    Esophageal cancer Neg Hx    Rectal cancer Neg Hx    Stomach cancer Neg Hx    Gout Neg Hx     Social History   Socioeconomic History   Marital status: Single    Spouse name: Not on file   Number of children: Not on file   Years of education: Not on file   Highest education level: Not on file  Occupational History   Not on file  Tobacco Use   Smoking status: Never   Smokeless tobacco: Never  Substance and Sexual Activity   Alcohol use: Yes    Alcohol/week: 7.0 standard drinks    Types: 7 Cans of beer per week    Comment: daily   Drug use: No   Sexual activity: Yes    Birth control/protection: Condom  Other Topics Concern   Not on file   Social History Narrative   Works as a Nature conservation officer.    Not married    No kids    He likes to go to the gym. Likes to go to go to restaurants.    Social Determinants of Health   Financial Resource Strain: Not on file  Food Insecurity: Not on file  Transportation Needs: Not on file  Physical Activity: Not on file  Stress: Not on file  Social Connections: Not on file  Intimate Partner Violence: Not on file    Outpatient Medications Prior to Visit  Medication Sig Dispense Refill   amitriptyline (ELAVIL) 50 MG tablet Take 50 mg by mouth at bedtime.     Aspirin-Acetaminophen-Caffeine (EXCEDRIN PO) Take 1 tablet by mouth 2 (two) times daily as needed (headache).     cetirizine (ZYRTEC) 10 MG tablet Take 10 mg by mouth daily.     diphenhydrAMINE-Phenylephrine (THERAFLU COLD/COUGH NIGHTTIME PO) Take 30 mLs by mouth at bedtime as needed (head cold).     sildenafil (VIAGRA) 50 MG tablet Take 1 tablet (50 mg total) by mouth daily as needed for erectile dysfunction (take 1/2 to 1 pill as needed). 10 tablet 6  SUMAtriptan (IMITREX) 50 MG tablet TAKE 1 TABLET BY MOUTH EVERY 2 HOURS AS NEEDED FOR MIGRAINE.MAY REPEAT IN 2 HOURS IF HEADACHE PERSISTS OR RECURS. 10 tablet 6   tamsulosin (FLOMAX) 0.4 MG CAPS capsule TAKE 1 CAPSULE(0.4 MG) BY MOUTH DAILY AFTER BREAKFAST 90 capsule 0   amitriptyline (ELAVIL) 50 MG tablet Take 1 tablet (50 mg total) by mouth at bedtime. 90 tablet 0   No facility-administered medications prior to visit.    No Known Allergies  ROS Review of Systems  Constitutional:  Negative for chills and fever.  HENT:  Positive for congestion. Negative for sore throat.   Respiratory:  Positive for cough and wheezing.   Cardiovascular:  Negative for chest pain.     Objective:    Physical Exam Vitals reviewed.  Constitutional:      Appearance: He is well-developed.  HENT:     Head:     Comments: Eardrums are normal bilaterally    Mouth/Throat:     Mouth: Mucous  membranes are moist.     Pharynx: Oropharynx is clear.  Cardiovascular:     Rate and Rhythm: Normal rate and regular rhythm.  Pulmonary:     Comments: No rales.  Few faint wheezes.  No respiratory distress. Musculoskeletal:     Cervical back: Neck supple.  Neurological:     Mental Status: He is alert.    BP (!) 152/100 (BP Location: Left Arm, Patient Position: Sitting, Cuff Size: Large)   Pulse 83   Temp 97.7 F (36.5 C) (Oral)   Ht 6\' 2"  (1.88 m)   Wt 262 lb 14.4 oz (119.3 kg)   SpO2 95%   BMI 33.75 kg/m  Wt Readings from Last 3 Encounters:  05/01/21 262 lb 14.4 oz (119.3 kg)  12/26/20 255 lb 11.2 oz (116 kg)  11/02/20 252 lb (114.3 kg)     Health Maintenance Due  Topic Date Due   Hepatitis C Screening  Never done   Zoster Vaccines- Shingrix (1 of 2) Never done    There are no preventive care reminders to display for this patient.  Lab Results  Component Value Date   TSH 0.60 03/18/2018   Lab Results  Component Value Date   WBC 5.8 10/11/2020   HGB 15.1 10/11/2020   HCT 47.1 10/11/2020   MCV 88.7 10/11/2020   PLT 196 10/11/2020   Lab Results  Component Value Date   NA 136 10/11/2020   K 3.9 10/11/2020   CO2 26 10/11/2020   GLUCOSE 115 (H) 10/11/2020   BUN 17 10/11/2020   CREATININE 1.64 (H) 10/11/2020   BILITOT 0.6 10/11/2020   ALKPHOS 65 10/11/2020   AST 25 10/11/2020   ALT 23 10/11/2020   PROT 7.3 10/11/2020   ALBUMIN 3.8 10/11/2020   CALCIUM 8.8 (L) 10/11/2020   ANIONGAP 8 10/11/2020   GFR 76.48 03/18/2018   Lab Results  Component Value Date   CHOL 218 (H) 03/18/2018   Lab Results  Component Value Date   HDL 40.90 03/18/2018   Lab Results  Component Value Date   LDLCALC 161 (H) 08/09/2015   Lab Results  Component Value Date   TRIG 209.0 (H) 03/18/2018   Lab Results  Component Value Date   CHOLHDL 5 03/18/2018   Lab Results  Component Value Date   HGBA1C 5.6 03/18/2018      Assessment & Plan:   URI with mild reactive  airway features.  COVID screen negative.  -Albuterol MDI 2 puffs every 4-6  hours as needed for wheeze -Prednisone 20 mg take 2 tablets once daily for 5 days -We decided to cover with Zithromax for 5 days.  Follow-up promptly for any fever or increased dyspnea   Meds ordered this encounter  Medications   predniSONE (DELTASONE) 20 MG tablet    Sig: Take two tablets by mouth once daily for 5 days    Dispense:  10 tablet    Refill:  0   azithromycin (ZITHROMAX) 250 MG tablet    Sig: Take 2 tablets on day 1, then 1 tablet daily on days 2 through 5    Dispense:  6 tablet    Refill:  0   albuterol (VENTOLIN HFA) 108 (90 Base) MCG/ACT inhaler    Sig: Inhale 2 puffs into the lungs every 4 (four) hours as needed for wheezing or shortness of breath.    Dispense:  8 g    Refill:  0    Follow-up: No follow-ups on file.    Carolann Littler, MD

## 2021-05-14 ENCOUNTER — Other Ambulatory Visit: Payer: Self-pay | Admitting: Family Medicine

## 2021-05-16 ENCOUNTER — Telehealth: Payer: Self-pay | Admitting: Adult Health

## 2021-05-16 NOTE — Telephone Encounter (Signed)
Patient calling in with respiratory symptoms: Shortness of breath, chest pain, palpitations or other red words send to Triage  Does the patient have a fever over 100, cough, congestion, sore throat, runny nose, lost of taste/smell (please list symptoms that patient has)? Cough,sneezing  What date did symptoms start?05-15-2021 (If over 5 days ago, pt may be scheduled for in person visit)  Have you tested for Covid in the last 5 days? No   If yes, was it positive []  OR negative [] ? If positive in the last 5 days, please schedule virtual visit now. If negative, schedule for an in person OV with the next available provider if PCP has no openings. Please also let patient know they will be tested again (follow the script below)  "you will have to arrive 76mins prior to your appt time to be Covid tested. Please park in back of office at the [facility] & call [phone] to let the staff know you have arrived. A staff member will meet you at your car to do a rapid covid test. Once the test has resulted you will be notified by phone of your results to determine if appt will remain an in person visit or be converted to a virtual/phone visit. If you arrive less than 37mins before your appt time, your visit will be automatically converted to virtual & any recommended testing will happen AFTER the visit." Pt has virtual with cory on 05-17-2021 at 1130 am  [place]  If no availability for virtual visit in office,  please schedule another [place] office  If no availability at another [place] office, please instruct patient that they can schedule an evisit or virtual visit through their mychart account. Visits up to 8pm  patients can be seen in office 5 days after positive COVID test

## 2021-05-17 ENCOUNTER — Telehealth (INDEPENDENT_AMBULATORY_CARE_PROVIDER_SITE_OTHER): Payer: 59 | Admitting: Adult Health

## 2021-05-17 DIAGNOSIS — J069 Acute upper respiratory infection, unspecified: Secondary | ICD-10-CM | POA: Diagnosis not present

## 2021-05-17 MED ORDER — AZITHROMYCIN 250 MG PO TABS: ORAL_TABLET | ORAL | 0 refills | Status: AC

## 2021-05-17 MED ORDER — PREDNISONE 10 MG PO TABS: 10.0000 mg | ORAL_TABLET | Freq: Every day | ORAL | 0 refills | Status: DC

## 2021-05-17 NOTE — Progress Notes (Signed)
Virtual Visit via Telephone Note  I connected with Gerald Johnson on 05/17/21 at 11:30 AM EST by telephone and verified that I am speaking with the correct person using two identifiers.   I discussed the limitations, risks, security and privacy concerns of performing an evaluation and management service by telephone and the availability of in person appointments. I also discussed with the patient that there may be a patient responsible charge related to this service. The patient expressed understanding and agreed to proceed.  Location patient: home Location provider: work or home office Participants present for the call: patient, provider Patient did not have a visit in the prior 7 days to address this/these issue(s).   History of Present Illness: 60 year old male who is being evaluated today for recurrent respiratory infection.  He was seen roughly 18 days ago by another provider with similar symptoms and was treated with prednisone and azithromycin, over the last week or so his symptoms have returned.  Symptoms include dry hacking cough, runny nose with discolored mucus, chest congestion.  Denies fevers, chills, shortness of breath or wheezing.  He does report that the albuterol inhaler he has provided some temporary relief.   Observations/Objective: Patient sounds cheerful and well on the phone. I do not appreciate any SOB. Speech and thought processing are grossly intact. Patient reported vitals:  Assessment and Plan: 1. Upper respiratory tract infection, unspecified type - Will retreat due to symptoms.  - Follow up as needed - azithromycin (ZITHROMAX) 250 MG tablet; Take 2 tablets on day 1, then 1 tablet daily on days 2 through 5  Dispense: 6 tablet; Refill: 0 - predniSONE (DELTASONE) 10 MG tablet; Take 1 tablet (10 mg total) by mouth daily with breakfast.  Dispense: 7 tablet; Refill: 0   Follow Up Instructions:   I did not refer this patient for an OV in the next 24 hours for  this/these issue(s).  I discussed the assessment and treatment plan with the patient. The patient was provided an opportunity to ask questions and all were answered. The patient agreed with the plan and demonstrated an understanding of the instructions.   The patient was advised to call back or seek an in-person evaluation if the symptoms worsen or if the condition fails to improve as anticipated.  I provided 15 minutes of non-face-to-face time during this encounter.   [name], NP

## 2021-06-08 ENCOUNTER — Telehealth: Payer: Self-pay | Admitting: Adult Health

## 2021-06-08 ENCOUNTER — Encounter: Payer: Self-pay | Admitting: Adult Health

## 2021-06-08 ENCOUNTER — Ambulatory Visit (INDEPENDENT_AMBULATORY_CARE_PROVIDER_SITE_OTHER): Payer: 59 | Admitting: Adult Health

## 2021-06-08 ENCOUNTER — Other Ambulatory Visit: Payer: Self-pay | Admitting: Adult Health

## 2021-06-08 VITALS — BP 140/100 | HR 62 | Temp 98.1°F | Ht 74.0 in | Wt 256.0 lb

## 2021-06-08 DIAGNOSIS — N401 Enlarged prostate with lower urinary tract symptoms: Secondary | ICD-10-CM | POA: Diagnosis not present

## 2021-06-08 DIAGNOSIS — Z0000 Encounter for general adult medical examination without abnormal findings: Secondary | ICD-10-CM | POA: Diagnosis not present

## 2021-06-08 DIAGNOSIS — G43009 Migraine without aura, not intractable, without status migrainosus: Secondary | ICD-10-CM | POA: Diagnosis not present

## 2021-06-08 DIAGNOSIS — N529 Male erectile dysfunction, unspecified: Secondary | ICD-10-CM | POA: Diagnosis not present

## 2021-06-08 DIAGNOSIS — Z23 Encounter for immunization: Secondary | ICD-10-CM | POA: Diagnosis not present

## 2021-06-08 DIAGNOSIS — E782 Mixed hyperlipidemia: Secondary | ICD-10-CM | POA: Diagnosis not present

## 2021-06-08 DIAGNOSIS — Z1159 Encounter for screening for other viral diseases: Secondary | ICD-10-CM

## 2021-06-08 DIAGNOSIS — I10 Essential (primary) hypertension: Secondary | ICD-10-CM

## 2021-06-08 DIAGNOSIS — R972 Elevated prostate specific antigen [PSA]: Secondary | ICD-10-CM

## 2021-06-08 LAB — CBC WITH DIFFERENTIAL/PLATELET
Basophils Absolute: 0 10*3/uL (ref 0.0–0.1)
Basophils Relative: 0.6 % (ref 0.0–3.0)
Eosinophils Absolute: 0.2 10*3/uL (ref 0.0–0.7)
Eosinophils Relative: 4.7 % (ref 0.0–5.0)
HCT: 44.7 % (ref 39.0–52.0)
Hemoglobin: 14.8 g/dL (ref 13.0–17.0)
Lymphocytes Relative: 27.7 % (ref 12.0–46.0)
Lymphs Abs: 1.4 10*3/uL (ref 0.7–4.0)
MCHC: 33.1 g/dL (ref 30.0–36.0)
MCV: 86.1 fl (ref 78.0–100.0)
Monocytes Absolute: 0.4 10*3/uL (ref 0.1–1.0)
Monocytes Relative: 8.6 % (ref 3.0–12.0)
Neutro Abs: 3 10*3/uL (ref 1.4–7.7)
Neutrophils Relative %: 58.4 % (ref 43.0–77.0)
Platelets: 298 10*3/uL (ref 150.0–400.0)
RBC: 5.2 Mil/uL (ref 4.22–5.81)
RDW: 14.5 % (ref 11.5–15.5)
WBC: 5.2 10*3/uL (ref 4.0–10.5)

## 2021-06-08 LAB — COMPREHENSIVE METABOLIC PANEL
ALT: 18 U/L (ref 0–53)
AST: 20 U/L (ref 0–37)
Albumin: 4 g/dL (ref 3.5–5.2)
Alkaline Phosphatase: 325 U/L — ABNORMAL HIGH (ref 39–117)
BUN: 14 mg/dL (ref 6–23)
CO2: 26 mEq/L (ref 19–32)
Calcium: 8.9 mg/dL (ref 8.4–10.5)
Chloride: 104 mEq/L (ref 96–112)
Creatinine, Ser: 1.27 mg/dL (ref 0.40–1.50)
GFR: 61.39 mL/min (ref >60.00)
Glucose, Bld: 91 mg/dL (ref 70–99)
Potassium: 4.3 mEq/L (ref 3.5–5.1)
Sodium: 141 mEq/L (ref 135–145)
Total Bilirubin: 1 mg/dL (ref 0.2–1.2)
Total Protein: 6.8 g/dL (ref 6.0–8.3)

## 2021-06-08 LAB — LIPID PANEL
Cholesterol: 237 mg/dL — ABNORMAL HIGH (ref 0–200)
HDL: 52.3 mg/dL (ref >39.00)
LDL Cholesterol: 171 mg/dL — ABNORMAL HIGH (ref 0–99)
NonHDL: 184.63
Total CHOL/HDL Ratio: 5
Triglycerides: 68 mg/dL (ref 0.0–149.0)
VLDL: 13.6 mg/dL (ref 0.0–40.0)

## 2021-06-08 LAB — PSA: PSA: 75.66 ng/mL — ABNORMAL HIGH (ref 0.10–4.00)

## 2021-06-08 LAB — HEMOGLOBIN A1C: Hgb A1c MFr Bld: 5.9 % (ref 4.6–6.5)

## 2021-06-08 LAB — TSH: TSH: 0.77 u[IU]/mL (ref 0.35–5.50)

## 2021-06-08 MED ORDER — SILDENAFIL CITRATE 50 MG PO TABS: 50.0000 mg | ORAL_TABLET | Freq: Every day | ORAL | 6 refills | Status: DC | PRN

## 2021-06-08 MED ORDER — TAMSULOSIN HCL 0.4 MG PO CAPS: 0.8000 mg | ORAL_CAPSULE | Freq: Every day | ORAL | 3 refills | Status: AC

## 2021-06-08 MED ORDER — AMLODIPINE BESYLATE 5 MG PO TABS: 5.0000 mg | ORAL_TABLET | Freq: Every day | ORAL | 0 refills | Status: DC

## 2021-06-08 MED ORDER — AMITRIPTYLINE HCL 50 MG PO TABS: 50.0000 mg | ORAL_TABLET | Freq: Every day | ORAL | 3 refills | Status: DC

## 2021-06-08 MED ORDER — ROSUVASTATIN CALCIUM 10 MG PO TABS: 10.0000 mg | ORAL_TABLET | Freq: Every day | ORAL | 3 refills | Status: DC

## 2021-06-08 MED ORDER — SUMATRIPTAN SUCCINATE 50 MG PO TABS: ORAL_TABLET | ORAL | 6 refills | Status: DC

## 2021-06-08 NOTE — Progress Notes (Signed)
Subjective:    Patient ID: Gerald Johnson, male    DOB: 14-Feb-1961, 61 y.o.   MRN: [id number]  HPI Patient presents for yearly preventative medicine examination. 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  Migraine headaches-prescribed Elavil 50 mg nightly and Imitrex 50 mg as needed for breakthrough migraine headaches.  Feels as though this controls his migraine headaches well.  Erectile dysfunction-takes Viagra as needed.  BPH - uses Flomax 0.4 mg daily. He reports that he continues to get up about 4 times a night to urinate.   Hyperlipidemia -was last checked in 2019.  Slightly elevated total cholesterol, triglycerides, and LDL.  Not currently on medication Lab Results  Component Value Date   CHOL 218 (H) 03/18/2018   HDL 40.90 03/18/2018   LDLCALC 161 (H) 08/09/2015   LDLDIRECT 166.0 03/18/2018   TRIG 209.0 (H) 03/18/2018   CHOLHDL 5 03/18/2018   Hypertension - BP has been elevated over the last few visits. He does not check at home. Denies symptoms.   All immunizations and health maintenance protocols were reviewed with the patient and needed orders were placed.  Appropriate screening laboratory values were ordered for the patient including screening of hyperlipidemia, renal function and hepatic function.  Medication reconciliation,  past medical history, social history, problem list and allergies were reviewed in detail with the patient  Goals were established with regard to weight loss, exercise, and  diet in compliance with medications. He is weight lifting and has started running. His diet suffers and snacks often  Wt Readings from Last 3 Encounters:  06/08/21 256 lb (116.1 kg)   05/01/21 262 lb 14.4 oz (119.3 kg)  12/26/20 255 lb 11.2 oz (116 kg)   He is up to date on routine colon cancer screening   Review of Systems  Constitutional: Negative.   HENT: Negative.    Eyes: Negative.   Respiratory: Negative.    Cardiovascular: Negative.   Gastrointestinal: Negative.   Endocrine: Negative.   Genitourinary: Negative.   Musculoskeletal: Negative.   Skin: Negative.   Allergic/Immunologic: Negative.   Neurological: Negative.   Hematological: Negative.   Psychiatric/Behavioral: Negative.    All other systems reviewed and are negative.  Past Medical History:  Diagnosis Date   Acute prostatitis 12/02/2007   ALLERGIC RHINITIS 02/26/2007   Asthma    ERECTILE DYSFUNCTION, NON-ORGANIC, MILD 11/08/2008   Headache behind the eyes 07/03/2010   New problem noted 27 2012    Hemorrhoids    HYPERLIPIDEMIA 02/26/2007   Migraines    Sciatica 05/04/2008   SINUSITIS, RECURRENT 02/28/2010    Social History   Socioeconomic History   Marital status: Single    Spouse name: Not on file   Number of children: Not on file   Years of education: Not on file   Highest education level: Not on file  Occupational History   Not on file  Tobacco Use   Smoking status: Never   Smokeless tobacco: Never  Substance and Sexual Activity   Alcohol use: Yes    Alcohol/week: 7.0 standard drinks    Types: 7 Cans of beer per week    Comment: daily   Drug use: No   Sexual activity: Yes    Birth control/protection: Condom  Other Topics Concern   Not on  file  Social History Narrative   Works as a Nature conservation officer.    Not married    No kids    He likes to go to the gym. Likes to go to go to restaurants.    Social Determinants of Health   Financial Resource Strain: Not on file  Food Insecurity: Not on file  Transportation Needs: Not on file  Physical Activity: Not on file  Stress: Not on file  Social Connections: Not on file  Intimate Partner Violence: Not on file    Past  Surgical History:  Procedure Laterality Date   WISDOM TOOTH EXTRACTION      Family History  Problem Relation Age of Onset   Diabetes Brother    Colon cancer Neg Hx    Esophageal cancer Neg Hx    Rectal cancer Neg Hx    Stomach cancer Neg Hx    Gout Neg Hx     No Known Allergies  Current Outpatient Medications on File Prior to Visit  Medication Sig Dispense Refill   albuterol (VENTOLIN HFA) 108 (90 Base) MCG/ACT inhaler Inhale 2 puffs into the lungs every 4 (four) hours as needed for wheezing or shortness of breath. 8 g 0   Aspirin-Acetaminophen-Caffeine (EXCEDRIN PO) Take 1 tablet by mouth 2 (two) times daily as needed (headache).     cetirizine (ZYRTEC) 10 MG tablet Take 10 mg by mouth daily.     sildenafil (VIAGRA) 50 MG tablet Take 1 tablet (50 mg total) by mouth daily as needed for erectile dysfunction (take 1/2 to 1 pill as needed). 10 tablet 6   SUMAtriptan (IMITREX) 50 MG tablet TAKE 1 TABLET BY MOUTH EVERY 2 HOURS AS NEEDED FOR MIGRAINE.MAY REPEAT IN 2 HOURS IF HEADACHE PERSISTS OR RECURS. 10 tablet 6   tamsulosin (FLOMAX) 0.4 MG CAPS capsule TAKE 1 CAPSULE(0.4 MG) BY MOUTH DAILY AFTER BREAKFAST 90 capsule 0   amitriptyline (ELAVIL) 50 MG tablet Take 1 tablet (50 mg total) by mouth at bedtime. 90 tablet 0   No current facility-administered medications on file prior to visit.    BP (!) 140/100    Pulse 62    Temp 98.1 F (36.7 C) (Oral)    Ht 6\' 2"  (1.88 m)    Wt 256 lb (116.1 kg)    SpO2 96%    BMI 32.87 kg/m        Objective:   Physical Exam Vitals and nursing note reviewed.  Constitutional:      General: He is not in acute distress.    Appearance: Normal appearance. He is well-developed. He is obese.  HENT:     Head: Normocephalic and atraumatic.     Right Ear: Tympanic membrane, ear canal and external ear normal. There is no impacted cerumen.     Left Ear: Tympanic membrane, ear canal and external ear normal. There is no impacted cerumen.     Nose: Nose  normal. No congestion or rhinorrhea.     Mouth/Throat:     Mouth: Mucous membranes are moist.     Pharynx: Oropharynx is clear. No oropharyngeal exudate or posterior oropharyngeal erythema.  Eyes:     General:        Right eye: No discharge.        Left eye: No discharge.     Extraocular Movements: Extraocular movements intact.     Conjunctiva/sclera: Conjunctivae normal.     Pupils: Pupils are equal, round, and reactive to light.  Neck:     Vascular:  No carotid bruit.     Trachea: No tracheal deviation.  Cardiovascular:     Rate and Rhythm: Normal rate and regular rhythm.     Pulses: Normal pulses.     Heart sounds: Normal heart sounds. No murmur heard.   No friction rub. No gallop.  Pulmonary:     Effort: Pulmonary effort is normal. No respiratory distress.     Breath sounds: Normal breath sounds. No stridor. No wheezing, rhonchi or rales.  Chest:     Chest wall: No tenderness.  Abdominal:     General: Bowel sounds are normal. There is no distension.     Palpations: Abdomen is soft. There is no mass.     Tenderness: There is no abdominal tenderness. There is no right CVA tenderness, left CVA tenderness, guarding or rebound.     Hernia: No hernia is present.  Musculoskeletal:        General: No swelling, tenderness, deformity or signs of injury. Normal range of motion.     Right lower leg: No edema.     Left lower leg: No edema.  Lymphadenopathy:     Cervical: No cervical adenopathy.  Skin:    General: Skin is warm and dry.     Capillary Refill: Capillary refill takes less than 2 seconds.     Coloration: Skin is not jaundiced or pale.     Findings: No bruising, erythema, lesion or rash.  Neurological:     General: No focal deficit present.     Mental Status: He is alert and oriented to person, place, and time.     Cranial Nerves: No cranial nerve deficit.     Sensory: No sensory deficit.     Motor: No weakness.     Coordination: Coordination normal.     Gait: Gait  normal.     Deep Tendon Reflexes: Reflexes normal.  Psychiatric:        Mood and Affect: Mood normal.        Behavior: Behavior normal.        Thought Content: Thought content normal.        Judgment: Judgment normal.      Assessment & Plan:  1. Routine general medical examination at a health care facility - encouraged to work on diet and aerobic exercise to lose weight  - Follow up in one year or sooner if needed - CBC with Differential/Platelet; Future - Comprehensive metabolic panel; Future - Hemoglobin A1c; Future - Lipid panel; Future - TSH; Future  2. Benign prostatic hyperplasia with lower urinary tract symptoms, symptom details unspecified - Will increase Flomax to 0.8 for better control.  - Follow up if not at goal - PSA; Future - tamsulosin (FLOMAX) 0.4 MG CAPS capsule; Take 2 capsules (0.8 mg total) by mouth daily after breakfast.  Dispense: 180 capsule; Refill: 3  3. Erectile dysfunction, unspecified erectile dysfunction type  - CBC with Differential/Platelet; Future - Comprehensive metabolic panel; Future - Hemoglobin A1c; Future - Lipid panel; Future - TSH; Future - sildenafil (VIAGRA) 50 MG tablet; Take 1 tablet (50 mg total) by mouth daily as needed for erectile dysfunction (take 1/2 to 1 pill as needed).  Dispense: 10 tablet; Refill: 6  4. Migraine without aura and without status migrainosus, not intractable - Controlled.  - CBC with Differential/Platelet; Future - Comprehensive metabolic panel; Future - Hemoglobin A1c; Future - Lipid panel; Future - TSH; Future - amitriptyline (ELAVIL) 50 MG tablet; Take 1 tablet (50 mg total) by mouth at  bedtime.  Dispense: 90 tablet; Refill: 3  5. Mixed hyperlipidemia - Consider adding statin  - CBC with Differential/Platelet; Future - Comprehensive metabolic panel; Future - Hemoglobin A1c; Future - Lipid panel; Future - TSH; Future  6. Need for hepatitis C screening test  - Hep C Antibody; Future   7.  Essential hypertension - Will start on Norvasc 5 mg  - Follow up in 30 days or sooner - CBC with Differential/Platelet; Future - Comprehensive metabolic panel; Future - Hemoglobin A1c; Future - Lipid panel; Future - TSH; Future - amLODipine (NORVASC) 5 MG tablet; Take 1 tablet (5 mg total) by mouth daily.  Dispense: 90 tablet; Refill: 0  8 Need for pneumococcal vaccination  - Pneumococcal conjugate vaccine 20-valent (Prevnar 20)  9. Need for shingles vaccine  - Varicella-zoster vaccine IM   [name], NP

## 2021-06-08 NOTE — Patient Instructions (Signed)
It was great seeing you today   We will follow up with you regarding your lab work   I have increased your flomax to help with urinating at night   I have also started you on a blood pressure medication called Norvasc. Take this every day. Work on reducing sodium in your diet.   I would like to see you back in 30 days  Please pick up a blood pressure cuff and monitor your blood pressure at home

## 2021-06-08 NOTE — Telephone Encounter (Signed)
Spoke to patient informed him of his labs.  Cholesterol panel is elevated, will start him on Crestor 10 mg.  Concerning is his PSA which has increased from 40-75.  He has been referred over to urology multiple times but has failed to make an appointment.  Advised that there was concern that he has developed prostate cancer, he cannot put office appointment any longer.  We will place referral again for stat urology consultation

## 2021-06-11 LAB — HEPATITIS C ANTIBODY
Hepatitis C Ab: NONREACTIVE
SIGNAL TO CUT-OFF: 0.19 (ref <1.00)

## 2021-06-28 ENCOUNTER — Encounter: Payer: Self-pay | Admitting: Urology

## 2021-06-28 ENCOUNTER — Other Ambulatory Visit: Payer: Self-pay

## 2021-06-28 ENCOUNTER — Ambulatory Visit: Payer: 59 | Admitting: Urology

## 2021-06-28 VITALS — BP 151/95 | HR 85 | Ht 75.0 in | Wt 255.0 lb

## 2021-06-28 DIAGNOSIS — R972 Elevated prostate specific antigen [PSA]: Secondary | ICD-10-CM

## 2021-06-28 NOTE — Patient Instructions (Addendum)
  Transrectal Prostate Biopsy Patient Education and Post Procedure Instructions    -Definition A prostate biopsy is the removal of a small amount of tissue from the prostate gland. The tissue is examined to determine whether there is cancer.  -Reasons for Procedure A prostate biopsy is usually done after an abnormal finding by: Digital rectal exam Prostate specific antigen (PSA) blood test A prostate biopsy is the only way to find out if cancer cells are present.  -Possible Complications Problems from the procedure are rare, but all procedures have some risk including: Infection Bruising or lengthy bleeding from the rectum, or in urine or semen Difficulty urinating Reactions to anesthesia Factors that may increase the risk of complications include: Smoking History of bleeding disorders or easy bruising Use of any medications, over-the-counter medications, or herbal supplements Sensitivity or allergy to latex, medications, or anesthesia.  -Prior to Procedure Talk to your doctor about your medications. Blood thinning medications including aspirin should be stopped 1 week prior to procedure. If prescribed by your cardiologist we may need approval before stopping medications. Use a Fleets enema 2 hours before the procedure. Can be purchased at your pharmacy. Antibiotics will be administered in the clinic prior to procedure.  Please make sure you eat a light meal prior to coming in for your appointment. This can help prevent lightheadedness during the procedure and upset stomach from antibiotics. Please bring someone with you to the procedure to drive you home.  -Anesthesia Transrectal biopsy: Local anesthesia--Just the area that is being operated on is numbed using an injectable anesthetic.  -Description of the Procedure Transrectal biopsy--Your doctor will insert a small ultrasound device into the rectum. This device will produce sound waves to create an image of the prostate.  These images will help guide placement of the needle. Your doctor will then insert the needle through the wall of the rectum and into the prostate gland. The procedure should take approximately 15-30 minutes.  -Will It Hurt? You may have discomfort and soreness at the biopsy site. Pain and discomfort after the procedure can be managed with medications.  -Postoperative Care When you return home after the procedure, do the following to help ensure a smooth recovery: Stay hydrated. Drink plenty of fluids for the next few days. Avoid difficult physical activity the day and evening of the procedure. Keep in mind that you may see blood in your urine, stool, or semen for several days. Resume any medications that were stopped when you are advised to do so.  After the sample is taken, it will be sent to a pathologist for examination under a microscope. This doctor will analyze the sample for cancer. You will be scheduled for an appointment to discuss results. If cancer is present, your doctor will work with you to develop a treatment plan.   -Call Your Doctor or Seek Immediate Medical Attention It is important to monitor your recovery. Alert your doctor to any problems. If any of the following occur, call your doctor or go to the emergency room: Fever 100.5 or greater within 1 week post procedure go directly to ER Call the office for: Blood in the urine more than 1 week or in semen for more than 6 weeks post-biopsy Pain that you cannot control with the medications you have been given Pain, burning, urgency, or frequency of urination Cough, shortness of breath, or chest pain- if severe go to ER Heavy rectal bleeding or bleeding that lasts more than 1 week after the biopsy If you   have any questions or concerns please contact our office at ([place], [place] [phone]    Prostate Cancer The prostate is a small gland that helps make semen.  It is located below a man's bladder, in front of the rectum. Prostate cancer is when abnormal cells grow in this gland. What are the causes? The cause of this condition is not known. What increases the risk? Being age 37 or older. Having a family history of prostate cancer. Having a family history of cancer of the breasts or ovaries. Having genes that are passed from parent to child (inherited). Having Lynch syndrome. African American men and men of African descent are diagnosed with prostate cancer at higher rates than other men. What are the signs or symptoms? Problems peeing (urinating). This may include: A stream that is weak, or pee that stops and starts. Trouble starting or stopping your pee. Trouble emptying all of your pee. Needing to pee more often, especially at night. Blood in your pee or semen. Pain in the: Lower back. Lower belly (abdomen). Hips. Trouble getting an erection. Weakness or numbness in the legs or feet. How is this treated? Treatment for this condition depends on: How much the cancer has spread. Your age. The kind of treatment you want. Your health. Treatments include: Being watched. This is called observation. You will be tested from time to time, but you will not get treated. Tests are to make sure that the cancer is not growing. Surgery. This may be done to: Take out (remove) the prostate. Freeze and kill cancer cells. Radiation. This uses a strong beam of energy to kill cancer cells. Chemotherapy. This uses medicines that stop cancer cells from increasing. This kills cancer cells and healthy cells. Targeted therapy. This kills cancer cells only. Healthy cells are not affected. Hormone treatment. This stops the body from making hormones that help the cancer cells grow. Follow these instructions at home: Lifestyle Do not smoke or use any products that contain nicotine or tobacco. If you need help quitting, ask your doctor. Eat a healthy  diet. Treatment may affect your ability to have sex. If you have a partner, touch, hold, hug, and caress your partner to have intimate moments. Get plenty of sleep. Ask your doctor for help to find a support group for men with prostate cancer. General instructions Take over-the-counter and prescription medicines only as told by your doctor. If you have to go to the hospital, let your cancer doctor (oncologist) know. Keep all follow-up visits. Where to find more information American Cancer Society: www.cancer.[place] of Clinical Oncology: www.cancer.net [employer]: www.cancer.gov Contact a doctor if: You have new or more trouble peeing. You have new or more blood in your pee. You have new or more pain in your hips, back, or chest. Get help right away if: You have weakness in your legs. You lose feeling in your legs. You cannot control your pee or your poop (stool). You have chills or a fever. Summary The prostate is a male gland that helps make semen. Prostate cancer is when abnormal cells grow in this gland. Treatment includes doing surgery, using medicines, using strong beams of energy, or watching without treatment. Ask your doctor for help to find a support group for men with prostate cancer. Contact a doctor if you have problems peeing or have any new pain that you did not have before. This information is not intended to replace advice given to you by  your health care provider. Make sure you discuss any questions you have with your health care provider. Document Revised: 08/09/2020 Document Reviewed: 08/09/2020 Elsevier Patient Education  2022 [place].  Transrectal Ultrasound-Guided Prostate Biopsy A transrectal ultrasound-guided prostate biopsy is a procedure to remove samples of prostate tissue for testing. The prostate is a walnut-sized gland that is located below the bladder and in front of the rectum. During this procedure, a small device  (probe) is lubricated and put inside the rectum. The probe sends out sound waves that make a picture of the prostate and surrounding tissues (transrectal ultrasound). The images are used to help guide the process of removing the samples. The samples are taken to a lab to be checked for prostate cancer. This procedure is usually done to evaluate the prostate gland of men who have raised (elevated) levels of prostate-specific antigen (PSA), which can be a sign of prostate cancer or prostate enlargement related to aging (benign prostatic hyperplasia, or BPH). Tell a health care provider about: Any allergies you have. All medicines you are taking, including vitamins, herbs, eye drops, creams, and over-the-counter medicines. Any problems you or family members have had with anesthetic medicines. Any bleeding problems you have. Any surgeries you have had. Any medical conditions you have. Any prostate infections you have had. What are the risks? Generally, this is a safe procedure. However, problems may occur, including: Prostate infection. Bleeding from the rectum. Blood in the urine. Allergic reactions to medicines. Damage to surrounding structures such as blood vessels, organs, or muscles. Difficulty passing urine. Nerve damage. This is usually temporary. What happens before the procedure? Medicines Ask your health care provider about: Changing or stopping your regular medicines. This is especially important if you are taking diabetes medicines or blood thinners. Taking medicines such as aspirin and ibuprofen. These medicines can thin your blood. Do not take these medicines unless your health care provider tells you to take them. Taking over-the-counter medicines, vitamins, herbs, and supplements. General instructions Follow instructions from your health care provider about eating and drinking. In most instances, you will not need to stop eating and drinking completely before the procedure. You  will be given an enema. During an enema, a liquid is injected into your rectum to clear out waste. You may have a blood or urine sample taken. Ask your health care provider what steps will be taken to help prevent infection. These steps may include: Washing skin with a germ-killing soap. Taking antibiotic medicine. If you will be going home right after the procedure, plan to have a responsible adult: Take you home from the hospital or clinic. You will not be allowed to drive. Care for you for the time you are told. What happens during the procedure?  An IV will be inserted into one of your veins. You will be given one or both of the following: A medicine to help you relax (sedative). A medicine to numb the area (local anesthetic). You will be placed on your left side, and your knees will be bent toward your chest. A probe with lubricated gel will be placed into your rectum, and images will be taken of your prostate and surrounding structures. Numbing medicine will be injected into your prostate. A biopsy needle will be inserted through your rectum or perineum and guided to your prostate using the ultrasound images. Prostate tissue samples will be removed, and the needle and probe will then be removed. The biopsy samples will be sent to a lab to be tested.  The procedure may vary among health care providers and hospitals. What happens after the procedure? Your blood pressure, heart rate, breathing rate, and blood oxygen level will be monitored until you leave the hospital or clinic. You may have some discomfort in the rectal area. You will be given pain medicine as needed. If you were given a sedative during the procedure, it can affect you for several hours. Do not drive or operate machinery until your health care provider says that it is safe. It is up to you to get the results of your procedure. Ask your health care provider, or the department that is doing the procedure, when your results  will be ready. Keep all follow-up visits. This is important. Summary A transrectal ultrasound-guided biopsy removes samples of tissue from your prostate using ultrasound-guided sound waves to help guide the process. This procedure is usually done to evaluate the prostate gland of men who have raised (elevated) levels of prostate-specific antigen (PSA), which can be a sign of prostate cancer or prostate enlargement related to aging. After your procedure, you may feel some discomfort in the rectal area. Plan to have a responsible adult take you home from the hospital or clinic, and follow up with your health care provider for your results. This information is not intended to replace advice given to you by your health care provider. Make sure you discuss any questions you have with your health care provider. Document Revised: 11/06/2020 Document Reviewed: 11/06/2020 Elsevier Patient Education  [place].

## 2021-06-28 NOTE — Progress Notes (Signed)
06/28/21 11:11 AM   Gerald Johnson Apr 12, 1961 [id number]  CC: Elevated PSA  HPI: 61 year old male with obesity and BMI of 32, urinary symptoms on Flomax, and ED on 50 mg sildenafil who was found of a significantly elevated PSA of 76 on 06/08/2021.  This had increased significantly from 41 in May 2022, and 11 in October 2019, and 3.5 in 2017.  He apparently was referred to urology previously, but did not follow-up for those visits.  His primary urinary complaint is some weak stream, especially overnight and nocturia 3 times.  He denies a family history of prostate cancer.  He is not married and does not have any children.  He denies weight loss or bone pain.   PMH: Past Medical History:  Diagnosis Date   Acute prostatitis 12/02/2007   ALLERGIC RHINITIS 02/26/2007   Asthma    ERECTILE DYSFUNCTION, NON-ORGANIC, MILD 11/08/2008   Headache behind the eyes 07/03/2010   New problem noted 27 2012    Hemorrhoids    HYPERLIPIDEMIA 02/26/2007   Migraines    Sciatica 05/04/2008   SINUSITIS, RECURRENT 02/28/2010    Surgical History: Past Surgical History:  Procedure Laterality Date   WISDOM TOOTH EXTRACTION      Family History: Family History  Problem Relation Age of Onset   Diabetes Brother    Colon cancer Neg Hx    Esophageal cancer Neg Hx    Rectal cancer Neg Hx    Stomach cancer Neg Hx    Gout Neg Hx     Social History:  reports that he has never smoked. He has never used smokeless tobacco. He reports current alcohol use of about 7.0 standard drinks per week. He reports that he does not use drugs.  Physical Exam: BP (!) 151/95 (BP Location: Left Arm, Patient Position: Sitting, Cuff Size: Large)    Pulse 85    Ht 6\' 3"  (1.905 m)    Wt 255 lb (115.7 kg)    BMI 31.87 kg/m    Constitutional:  Alert and oriented, No acute distress. Cardiovascular: No clubbing, cyanosis, or edema. Respiratory: Normal respiratory effort, no increased work of breathing. GI: Abdomen is soft, nontender,  nondistended, no abdominal masses DRE: Limited secondary to body habitus, firm and irregular  Laboratory Data: PSA history reviewed, see HPI  Assessment & Plan:   61 year old African-American male with very concerning PSA trend, most recently 26 in January 2023 which had increased from 77 in May 2022, and 11 in 2019.  DRE suspicious for prostate cancer today.  We reviewed the implications of an elevated PSA and the uncertainty surrounding it. In general, a man's PSA increases with age and is produced by both normal and cancerous prostate tissue. The differential diagnosis for elevated PSA includes BPH, prostate cancer, infection, recent intercourse/ejaculation, recent urethroscopic manipulation (foley placement/cystoscopy) or trauma, and prostatitis.   Management of an elevated PSA can include observation or prostate biopsy and we discussed this in detail. Our goal is to detect clinically significant prostate cancers, and manage with either active surveillance, surgery, or radiation for localized disease. Risks of prostate biopsy include bleeding, infection (including life threatening sepsis), pain, and lower urinary symptoms. Hematuria, hematospermia, and blood in the stool are all common after biopsy and can persist up to 4 weeks.   -Prostate biopsy ASAP, we discussed the likely diagnosis of prostate cancer and likely need for staging imaging after biopsy -We reviewed different treatment options for prostate cancer from localized disease to metastatic disease, and that  metastatic disease is typically managed by oncology  Nickolas Madrid, MD 06/28/2021  [facility] [address] [place], [place] [address] [phone]

## 2021-07-03 ENCOUNTER — Telehealth: Payer: Self-pay | Admitting: Adult Health

## 2021-07-03 NOTE — Telephone Encounter (Signed)
Spoke to pt and he stated that he was giving [place] an [place]. Pt stated that he will message tomorrow to let Tommi Rumps know the update of the biopsy date. Consultation is tomorrow at [facility].

## 2021-07-03 NOTE — Telephone Encounter (Signed)
Pt is sch for biopsy on 07-18-2020 at [place] urologist and he would like to know if its ok for him to wait on biopsy. Pt has an appt with [facility] urologist for consultation on 08-07-2021. Please advise

## 2021-07-16 ENCOUNTER — Ambulatory Visit: Payer: 59 | Admitting: Family Medicine

## 2021-07-16 NOTE — Progress Notes (Deleted)
ACUTE VISIT No chief complaint on file.  HPI: [name] is a 61 y.o. male, who is here today complaining of *** HPI  Review of Systems Rest see pertinent positives and negatives per HPI.  Current Outpatient Medications on File Prior to Visit  Medication Sig Dispense Refill   albuterol (VENTOLIN HFA) 108 (90 Base) MCG/ACT inhaler Inhale 2 puffs into the lungs every 4 (four) hours as needed for wheezing or shortness of breath. 8 g 0   amitriptyline (ELAVIL) 50 MG tablet Take 1 tablet (50 mg total) by mouth at bedtime. 90 tablet 3   amLODipine (NORVASC) 5 MG tablet Take 1 tablet (5 mg total) by mouth daily. 90 tablet 0   Aspirin-Acetaminophen-Caffeine (EXCEDRIN PO) Take 1 tablet by mouth 2 (two) times daily as needed (headache).     cetirizine (ZYRTEC) 10 MG tablet Take 10 mg by mouth daily.     rosuvastatin (CRESTOR) 10 MG tablet Take 1 tablet (10 mg total) by mouth daily. 90 tablet 3   sildenafil (VIAGRA) 50 MG tablet Take 1 tablet (50 mg total) by mouth daily as needed for erectile dysfunction (take 1/2 to 1 pill as needed). 10 tablet 6   SUMAtriptan (IMITREX) 50 MG tablet TAKE 1 TABLET BY MOUTH EVERY 2 HOURS AS NEEDED FOR MIGRAINE.MAY REPEAT IN 2 HOURS IF HEADACHE PERSISTS OR RECURS. 10 tablet 6   tamsulosin (FLOMAX) 0.4 MG CAPS capsule Take 2 capsules (0.8 mg total) by mouth daily after breakfast. 180 capsule 3   triamcinolone acetonide (KENALOG-40) 40 MG/ML injection Inject 2 mLs into the muscle once.     No current facility-administered medications on file prior to visit.     Past Medical History:  Diagnosis Date   Acute prostatitis 12/02/2007   ALLERGIC RHINITIS 02/26/2007   Asthma    ERECTILE DYSFUNCTION, NON-ORGANIC, MILD 11/08/2008   Headache behind the eyes 07/03/2010   New problem noted 27 2012    Hemorrhoids    HYPERLIPIDEMIA 02/26/2007   Migraines    Sciatica 05/04/2008   SINUSITIS, RECURRENT 02/28/2010   No Known Allergies  Social History    Socioeconomic History   Marital status: Single    Spouse name: Not on file   Number of children: Not on file   Years of education: Not on file   Highest education level: Not on file  Occupational History   Not on file  Tobacco Use   Smoking status: Never   Smokeless tobacco: Never  Substance and Sexual Activity   Alcohol use: Yes    Alcohol/week: 7.0 standard drinks    Types: 7 Cans of beer per week    Comment: daily   Drug use: No   Sexual activity: Yes    Birth control/protection: Condom  Other Topics Concern   Not on file  Social History Narrative   Works as a Nature conservation officer.    Not married    No kids    He likes to go to the gym. Likes to go to go to restaurants.    Social Determinants of Health   Financial Resource Strain: Not on file  Food Insecurity: Not on file  Transportation Needs: Not on file  Physical Activity: Not on file  Stress: Not on file  Social Connections: Not on file    There were no vitals filed for this visit. There is no height or weight on file to calculate BMI.  Physical Exam  ASSESSMENT AND PLAN:  There are no diagnoses linked  to this encounter.   No follow-ups on file.   Betty G. [place], MD  [facility]. [place] office.  Discharge Instructions   None

## 2021-07-18 ENCOUNTER — Other Ambulatory Visit: Payer: 59 | Admitting: Urology

## 2021-07-25 ENCOUNTER — Ambulatory Visit: Payer: 59 | Admitting: Urology

## 2021-07-31 DIAGNOSIS — C61 Malignant neoplasm of prostate: Secondary | ICD-10-CM | POA: Insufficient documentation

## 2021-08-10 DIAGNOSIS — C7951 Secondary malignant neoplasm of bone: Secondary | ICD-10-CM | POA: Insufficient documentation

## 2021-09-01 ENCOUNTER — Other Ambulatory Visit: Payer: Self-pay | Admitting: Adult Health

## 2021-09-01 DIAGNOSIS — I10 Essential (primary) hypertension: Secondary | ICD-10-CM

## 2021-12-07 ENCOUNTER — Other Ambulatory Visit: Payer: Self-pay | Admitting: Adult Health

## 2021-12-07 DIAGNOSIS — I10 Essential (primary) hypertension: Secondary | ICD-10-CM

## 2022-04-01 ENCOUNTER — Telehealth: Payer: Self-pay | Admitting: Family Medicine

## 2022-04-01 NOTE — Telephone Encounter (Signed)
Caller Name: Dayon Witt, wife Call back phone #: [phone]  Or ok to call pt at [phone]  Reason for Call: requesting TOC to Dr. Abelino Derrick due to closer proximity to pts home. Please advise.

## 2022-04-15 ENCOUNTER — Encounter: Payer: 59 | Admitting: Family Medicine

## 2022-04-15 NOTE — Telephone Encounter (Signed)
11.20.23 no show letter sent

## 2022-04-23 ENCOUNTER — Encounter: Payer: Self-pay | Admitting: Adult Health

## 2022-04-23 NOTE — Telephone Encounter (Signed)
1st no show with [facility], 2nd no show w/in 1 year with [facility], fee generated, letter sent via mail and MyChart

## 2022-05-01 DIAGNOSIS — G4700 Insomnia, unspecified: Secondary | ICD-10-CM | POA: Insufficient documentation

## 2022-05-16 ENCOUNTER — Ambulatory Visit: Payer: 59 | Admitting: Adult Health

## 2022-05-17 ENCOUNTER — Encounter: Payer: Self-pay | Admitting: Family Medicine

## 2022-05-17 ENCOUNTER — Telehealth: Payer: Self-pay | Admitting: Adult Health

## 2022-05-17 ENCOUNTER — Ambulatory Visit: Payer: 59 | Admitting: Nurse Practitioner

## 2022-05-17 ENCOUNTER — Ambulatory Visit: Payer: 59 | Admitting: Family Medicine

## 2022-05-17 VITALS — BP 132/80 | HR 82 | Temp 97.8°F | Ht 75.0 in | Wt 231.2 lb

## 2022-05-17 DIAGNOSIS — J069 Acute upper respiratory infection, unspecified: Secondary | ICD-10-CM

## 2022-05-17 LAB — POCT INFLUENZA A/B
Influenza A, POC: NEGATIVE
Influenza B, POC: NEGATIVE

## 2022-05-17 LAB — POC COVID19 BINAXNOW: SARS Coronavirus 2 Ag: NEGATIVE

## 2022-05-17 NOTE — Telephone Encounter (Signed)
Caller Name: Anish Vana Call back phone #: [phone]  Reason for Call: Pt is hoping to transfer care from Dr. Carlisle Cater with [facility] to Dr. Gena Fray here at [facility]. Recently had an acute with Dr. Gena Fray. They liked him as well as the location

## 2022-05-17 NOTE — Progress Notes (Signed)
[facility] [address] [place] [address] Dept: [phone] Dept Fax: [phone]  Office Visit  Subjective:    Patient ID: Gerald Johnson, male    DOB: 03-Nov-1960, 61 y.o..   MRN: [id number]  Chief Complaint  Patient presents with   Acute Visit    C/o having chest congestion, nasal drainage, cough x 4 days.  Has taken DyQuil Sever cold/flu.  No covid test.     History of Present Illness:  Patient is in today for with a 4-day history of cough, congestion and rhinorrhea. He denies fever. He is eating and drinking well. He is using Dayquil and this has helped. No one at home is sick.  Past Medical History: Patient Active Problem List   Diagnosis Date Noted   Migraine 09/17/2017   Gout 01/27/2015   ERECTILE DYSFUNCTION, NON-ORGANIC, MILD 11/08/2008   Mixed hyperlipidemia 02/26/2007   SINUSITIS, ACUTE NOS 02/26/2007   Allergic rhinitis 02/26/2007   Past Surgical History:  Procedure Laterality Date   WISDOM TOOTH EXTRACTION     Family History  Problem Relation Age of Onset   Diabetes Brother    Colon cancer Neg Hx    Esophageal cancer Neg Hx    Rectal cancer Neg Hx    Stomach cancer Neg Hx    Gout Neg Hx    Outpatient Medications Prior to Visit  Medication Sig Dispense Refill   albuterol (VENTOLIN HFA) 108 (90 Base) MCG/ACT inhaler Inhale 2 puffs into the lungs every 4 (four) hours as needed for wheezing or shortness of breath. 8 g 0   allopurinol (ZYLOPRIM) 300 MG tablet Take 300 mg by mouth daily.     amitriptyline (ELAVIL) 50 MG tablet Take 1 tablet (50 mg total) by mouth at bedtime. 90 tablet 3   Aspirin-Acetaminophen-Caffeine (EXCEDRIN PO) Take 1 tablet by mouth 2 (two) times daily as needed (headache).     cetirizine (ZYRTEC) 10 MG tablet Take 10 mg by mouth daily.     colchicine 0.6 MG tablet Take by mouth.     NUBEQA 300 MG tablet Take 600 mg by mouth 2 (two) times daily.     sildenafil (VIAGRA) 50  MG tablet Take 1 tablet (50 mg total) by mouth daily as needed for erectile dysfunction (take 1/2 to 1 pill as needed). 10 tablet 6   SUMAtriptan (IMITREX) 50 MG tablet TAKE 1 TABLET BY MOUTH EVERY 2 HOURS AS NEEDED FOR MIGRAINE.MAY REPEAT IN 2 HOURS IF HEADACHE PERSISTS OR RECURS. 10 tablet 6   tamsulosin (FLOMAX) 0.4 MG CAPS capsule Take by mouth.     traZODone (DESYREL) 50 MG tablet Take 1 tablet by mouth at bedtime.     amLODipine (NORVASC) 5 MG tablet TAKE 1 TABLET(5 MG) BY MOUTH DAILY (Patient not taking: Reported on 05/17/2022) 30 tablet 0   rosuvastatin (CRESTOR) 10 MG tablet Take 1 tablet (10 mg total) by mouth daily. (Patient not taking: Reported on 05/17/2022) 90 tablet 3   triamcinolone acetonide (KENALOG-40) 40 MG/ML injection Inject 2 mLs into the muscle once.     No facility-administered medications prior to visit.   No Known Allergies    Objective:   Today's Vitals   05/17/22 0832  BP: 132/80  Pulse: 82  Temp: 97.8 F (36.6 C)  TempSrc: Temporal  SpO2: 98%  Weight: 231 lb 3.2 oz (104.9 kg)  Height: '6\' 3"'$  (1.905 m)   Body mass index is 28.9 kg/m.   General: Well developed,  well nourished. No acute distress. HEENT: Normocephalic, non-traumatic. Conjunctiva is clear. Sclera is muddy. External ears normal. Left   EAC is blocked. Right TM is normal. Nose moderately congested. Mucous membranes moist.   Oropharynx clear. Good dentition. Neck: Supple. No lymphadenopathy. No thyromegaly. Lungs: Clear to auscultation bilaterally. No wheezing, rales or rhonchi. CV: RRR without murmurs or rubs. Pulses 2+ bilaterally. Psych: Alert and oriented. Normal mood and affect.  Health Maintenance Due  Topic Date Due   Zoster Vaccines- Shingrix (2 of 2) 08/03/2021   Lab Results: POCT Covid: Neg. POCT Influenza A& B: Neg.    Assessment & Plan:   1. Viral URI with cough Discussed home care for viral illness, including rest, pushing fluids, and OTC medications as needed for  symptom relief. Recommend hot tea with honey for sore throat symptoms. Follow-up if needed for worsening or persistent symptoms.  - POC COVID-19 - POCT Influenza A/B   Return if symptoms worsen or fail to improve.   Haydee Salter, MD

## 2022-06-12 ENCOUNTER — Encounter: Payer: 59 | Admitting: Family Medicine

## 2022-06-12 ENCOUNTER — Encounter: Payer: Self-pay | Admitting: Internal Medicine

## 2022-06-13 ENCOUNTER — Encounter: Payer: Self-pay | Admitting: Adult Health

## 2022-06-13 ENCOUNTER — Telehealth: Payer: Self-pay | Admitting: Adult Health

## 2022-06-13 NOTE — Telephone Encounter (Signed)
Pt was a no show for a TOC with Dr Gena Fray on 06/12/22, I sent a letter.

## 2022-06-13 NOTE — Telephone Encounter (Signed)
Dismissed from [facility] due to 2 missed TOC appointments (1 with Dr. Ethelene Hal & 1 with Dr. Gena Fray)

## 2022-06-28 DIAGNOSIS — N50819 Testicular pain, unspecified: Secondary | ICD-10-CM | POA: Insufficient documentation

## 2022-07-16 ENCOUNTER — Ambulatory Visit: Payer: 59 | Admitting: Podiatry

## 2022-07-16 ENCOUNTER — Ambulatory Visit (INDEPENDENT_AMBULATORY_CARE_PROVIDER_SITE_OTHER): Payer: 59

## 2022-07-16 DIAGNOSIS — M2031 Hallux varus (acquired), right foot: Secondary | ICD-10-CM

## 2022-07-16 DIAGNOSIS — M2141 Flat foot [pes planus] (acquired), right foot: Secondary | ICD-10-CM

## 2022-07-16 DIAGNOSIS — M2142 Flat foot [pes planus] (acquired), left foot: Secondary | ICD-10-CM

## 2022-07-16 DIAGNOSIS — M19071 Primary osteoarthritis, right ankle and foot: Secondary | ICD-10-CM | POA: Diagnosis not present

## 2022-07-16 DIAGNOSIS — M722 Plantar fascial fibromatosis: Secondary | ICD-10-CM | POA: Diagnosis not present

## 2022-07-16 DIAGNOSIS — M2032 Hallux varus (acquired), left foot: Secondary | ICD-10-CM

## 2022-07-16 MED ORDER — MELOXICAM 7.5 MG PO TABS: 7.5000 mg | ORAL_TABLET | Freq: Every day | ORAL | 0 refills | Status: DC | PRN

## 2022-07-16 NOTE — Patient Instructions (Signed)
For inserts I like powersteps, superfeet, aetrex

## 2022-07-16 NOTE — Progress Notes (Signed)
Subjective:   Patient ID: Gerald Johnson, male   DOB: 62 y.o.   MRN: [id number]   HPI Chief Complaint  Patient presents with   Foot Pain    RM 13  Left arch pain.   62 year old male presents the office today with above concerns.  Continued pain with walking.  States he gets pain if he sits and stands back up.  No treatment.  No injuries.  No other concerns.  No radiating pain.     Review of Systems  All other systems reviewed and are negative.   Past Medical History:  Diagnosis Date   Acute prostatitis 12/02/2007   ALLERGIC RHINITIS 02/26/2007   Asthma    ERECTILE DYSFUNCTION, NON-ORGANIC, MILD 11/08/2008   Headache behind the eyes 07/03/2010   New problem noted 27 2012    Hemorrhoids    HYPERLIPIDEMIA 02/26/2007   Migraines    Sciatica 05/04/2008   SINUSITIS, RECURRENT 02/28/2010    Past Surgical History:  Procedure Laterality Date   WISDOM TOOTH EXTRACTION       Current Outpatient Medications:    meloxicam (MOBIC) 7.5 MG tablet, Take 1 tablet (7.5 mg total) by mouth daily as needed for pain., Disp: 30 tablet, Rfl: 0   albuterol (VENTOLIN HFA) 108 (90 Base) MCG/ACT inhaler, Inhale 2 puffs into the lungs every 4 (four) hours as needed for wheezing or shortness of breath., Disp: 8 g, Rfl: 0   allopurinol (ZYLOPRIM) 300 MG tablet, Take 300 mg by mouth daily., Disp: , Rfl:    amitriptyline (ELAVIL) 50 MG tablet, Take 1 tablet (50 mg total) by mouth at bedtime., Disp: 90 tablet, Rfl: 3   amLODipine (NORVASC) 5 MG tablet, TAKE 1 TABLET(5 MG) BY MOUTH DAILY (Patient not taking: Reported on 05/17/2022), Disp: 30 tablet, Rfl: 0   Aspirin-Acetaminophen-Caffeine (EXCEDRIN PO), Take 1 tablet by mouth 2 (two) times daily as needed (headache)., Disp: , Rfl:    cetirizine (ZYRTEC) 10 MG tablet, Take 10 mg by mouth daily., Disp: , Rfl:    colchicine 0.6 MG tablet, Take by mouth., Disp: , Rfl:    NUBEQA 300 MG tablet, Take 600 mg by mouth 2 (two) times daily., Disp: , Rfl:    rosuvastatin  (CRESTOR) 10 MG tablet, Take 1 tablet (10 mg total) by mouth daily. (Patient not taking: Reported on 05/17/2022), Disp: 90 tablet, Rfl: 3   sildenafil (VIAGRA) 50 MG tablet, Take 1 tablet (50 mg total) by mouth daily as needed for erectile dysfunction (take 1/2 to 1 pill as needed)., Disp: 10 tablet, Rfl: 6   SUMAtriptan (IMITREX) 50 MG tablet, TAKE 1 TABLET BY MOUTH EVERY 2 HOURS AS NEEDED FOR MIGRAINE.MAY REPEAT IN 2 HOURS IF HEADACHE PERSISTS OR RECURS., Disp: 10 tablet, Rfl: 6   tamsulosin (FLOMAX) 0.4 MG CAPS capsule, Take by mouth., Disp: , Rfl:    traZODone (DESYREL) 50 MG tablet, Take 1 tablet by mouth at bedtime., Disp: , Rfl:   No Known Allergies       Objective:  Physical Exam  General: AAO x3, NAD  Dermatological: Skin is warm, dry and supple bilateral. There are no open sores, no preulcerative lesions, no rash or signs of infection present.  Vascular: Dorsalis Pedis artery and Posterior Tibial artery pedal pulses are 2/4 bilateral with immedate capillary fill time.  There is no pain with calf compression, swelling, warmth, erythema.   Neruologic: Grossly intact via light touch bilateral.  Negative Tinel sign.  Musculoskeletal: Upon weightbearing evaluation there is decreased  medial arch height upon weightbearing.  Hallux varus is noted bilaterally left side worse on the right with hammertoe contractures, digital deformities.  There is tenderness palpation on the plantar aspect calcaneus along the insertion of the plantar fascia.  Tenderness palpation on the lateral aspect along the calcaneocuboid joint as well.  There is no area pinpoint tenderness.  Flexor, extensor tendons appear to be intact.  No edema, erythema.  Gait: Unassisted, Nonantalgic.     Assessment:   62 year old male with plantar fasciitis, arch pain, arthritis, digital deformity     Plan:  -Treatment options discussed including all alternatives, risks, and complications -Etiology of symptoms were  discussed -X-rays were obtained and reviewed with the patient.  3 views of the feet were obtained.  Decreased calcaneal inclination well.  Hallux varus is present.  Arthritic changes present the midfoot.  Also calcifications present. -Prescribed mobic. Discussed side effects of the medication and directed to stop if any are to occur and call the office.  -We discussed using good arch support.  We discussed custom versus over-the-counter inserts.  Discussed different types of over-the-counter inserts as well. -Discussed stretching, icing on a regular basis. -Steroid injection if needed.   Trula Slade DPM

## 2022-07-24 ENCOUNTER — Encounter: Payer: Self-pay | Admitting: Internal Medicine

## 2022-08-09 ENCOUNTER — Telehealth: Payer: Self-pay | Admitting: Adult Health

## 2022-08-09 ENCOUNTER — Other Ambulatory Visit: Payer: Self-pay | Admitting: Adult Health

## 2022-08-09 DIAGNOSIS — I10 Essential (primary) hypertension: Secondary | ICD-10-CM

## 2022-08-09 NOTE — Telephone Encounter (Signed)
Pt call and stated he need a refill on his       amLODipine (NORVASC) 5 MG tablet pt stated he is out and have made a appt for Tuesday at 11;00 pt stated he need 4 pill call in until his appt to  [id number] - [place], [place] - [address] AT [place] Phone: [phone]  Fax: [phone]

## 2022-08-09 NOTE — Telephone Encounter (Signed)
Rx was refilled by provider

## 2022-08-09 NOTE — Telephone Encounter (Signed)
error 

## 2022-08-13 ENCOUNTER — Encounter: Payer: Self-pay | Admitting: Adult Health

## 2022-08-13 ENCOUNTER — Ambulatory Visit: Payer: 59 | Admitting: Adult Health

## 2022-08-13 VITALS — BP 120/80 | HR 88 | Temp 97.5°F | Ht 73.5 in | Wt 255.0 lb

## 2022-08-13 DIAGNOSIS — C61 Malignant neoplasm of prostate: Secondary | ICD-10-CM | POA: Diagnosis not present

## 2022-08-13 DIAGNOSIS — M1A9XX0 Chronic gout, unspecified, without tophus (tophi): Secondary | ICD-10-CM | POA: Diagnosis not present

## 2022-08-13 DIAGNOSIS — I10 Essential (primary) hypertension: Secondary | ICD-10-CM

## 2022-08-13 DIAGNOSIS — Z0000 Encounter for general adult medical examination without abnormal findings: Secondary | ICD-10-CM

## 2022-08-13 DIAGNOSIS — Z23 Encounter for immunization: Secondary | ICD-10-CM

## 2022-08-13 DIAGNOSIS — F5101 Primary insomnia: Secondary | ICD-10-CM

## 2022-08-13 DIAGNOSIS — E782 Mixed hyperlipidemia: Secondary | ICD-10-CM | POA: Diagnosis not present

## 2022-08-13 NOTE — Patient Instructions (Addendum)
It was great seeing you today   We will follow up with you regarding your lab work   Please let me know if you need anything   Please schedule a lab appointment at the front desk

## 2022-08-13 NOTE — Progress Notes (Signed)
Subjective:    Patient ID: Gerald Johnson, male    DOB: 08-31-1960, 62 y.o.   MRN: [id number]  HPI Patient presents for yearly preventative medicine examination. 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  Prostate Cancer with mets to multiple sites - is see by [facility]. He is on darolutamide and ADT and doing well. His last PSA was 0.05 ( pretreatment level of 75.66 in January 2023. He had a recent bone scan in January 2024 that showed  Overall similar scintigraphic evidence of multifocal osseous metastasis. No  new lesions identified. Reference areas of suspicious uptake include  sternum, spine, pelvis, right femur, left humerus, and bilateral ribs.  Physiologic tracer activity is identified in the soft tissues, kidneys and  urinary bladder.   Migraine headaches- Not as frequent as in the past. He is taking Imitrex PRN.   Erectile dysfunction-takes Viagra as needed.  Nocturia - uses Flomax 0.4 mg daily.   Insomnia - takes trazodone 50 mg QHS PRN   Hyperlipidemia - Was started on Crestor 10 mg last year. He denies myalgia or fatigue  Lab Results  Component Value Date   CHOL 237 (H) 06/08/2021   HDL 52.30 06/08/2021   LDLCALC 171 (H) 06/08/2021   LDLDIRECT 166.0 03/18/2018   TRIG 68.0 06/08/2021   CHOLHDL 5 06/08/2021   Hypertension - managed with Norvasc 5 mg. He has not had any dizziness, lightheadedness or blurred vision.   Gout - takes Allopurinol 300 mg daily.   Wt Readings from Last 3 Encounters:  08/13/22 255 lb (115.7 kg)  05/17/22 231 lb 3.2 oz (104.9 kg)  06/28/21 255 lb (115.7 kg)   All immunizations and health maintenance protocols were reviewed with the patient  and needed orders were placed. Second shingles vaccination given today   Appropriate screening laboratory values were ordered for the patient including screening of hyperlipidemia, renal function and hepatic function. If indicated by BPH, a PSA was ordered.  Medication reconciliation,  past medical history, social history, problem list and allergies were reviewed in detail with the patient  Goals were established with regard to weight loss, exercise, and  diet in compliance with medications. He does reports going to the gym every other day to work out. Tries to eat healthy  Wt Readings from Last 3 Encounters:  08/13/22 255 lb (115.7 kg)  05/17/22 231 lb 3.2 oz (104.9 kg)  06/28/21 255 lb (115.7 kg)   He has a colonoscopy schedule for April.   Review of Systems  Constitutional: Negative.   HENT: Negative.    Eyes: Negative.   Respiratory: Negative.    Cardiovascular: Negative.   Gastrointestinal: Negative.   Endocrine: Negative.   Genitourinary: Negative.   Musculoskeletal: Negative.   Skin: Negative.   Allergic/Immunologic: Negative.   Neurological: Negative.   Hematological: Negative.   Psychiatric/Behavioral: Negative.    All other systems reviewed and are negative.  Past Medical History:  Diagnosis Date   Acute prostatitis 12/02/2007   ALLERGIC RHINITIS 02/26/2007   Asthma    ERECTILE DYSFUNCTION, NON-ORGANIC, MILD 11/08/2008   Headache behind the eyes 07/03/2010   New problem noted 27 2012    Hemorrhoids    HYPERLIPIDEMIA 02/26/2007   Migraines    Sciatica 05/04/2008   SINUSITIS, RECURRENT 02/28/2010  Social History   Socioeconomic History   Marital status: Single    Spouse name: Not on file   Number of children: Not on file   Years of education: Not on file   Highest education level: Not on file  Occupational History   Not on file  Tobacco Use   Smoking status: Never   Smokeless tobacco: Never  Vaping Use   Vaping Use: Never used  Substance and Sexual  Activity   Alcohol use: Yes    Alcohol/week: 7.0 standard drinks of alcohol    Types: 7 Cans of beer per week    Comment: soically   Drug use: No   Sexual activity: Yes    Birth control/protection: Condom  Other Topics Concern   Not on file  Social History Narrative   Works as a Nature conservation officer.    Not married    No kids    He likes to go to the gym. Likes to go to go to restaurants.    Social Determinants of Health   Financial Resource Strain: Not on file  Food Insecurity: Not on file  Transportation Needs: Not on file  Physical Activity: Not on file  Stress: Not on file  Social Connections: Not on file  Intimate Partner Violence: Not on file    Past Surgical History:  Procedure Laterality Date   WISDOM TOOTH EXTRACTION      Family History  Problem Relation Age of Onset   Diabetes Brother    Colon cancer Neg Hx    Esophageal cancer Neg Hx    Rectal cancer Neg Hx    Stomach cancer Neg Hx    Gout Neg Hx     No Known Allergies  Current Outpatient Medications on File Prior to Visit  Medication Sig Dispense Refill   albuterol (VENTOLIN HFA) 108 (90 Base) MCG/ACT inhaler Inhale 2 puffs into the lungs every 4 (four) hours as needed for wheezing or shortness of breath. 8 g 0   allopurinol (ZYLOPRIM) 300 MG tablet Take 300 mg by mouth daily.     amLODipine (NORVASC) 5 MG tablet TAKE 1 TABLET(5 MG) BY MOUTH DAILY 30 tablet 0   Aspirin-Acetaminophen-Caffeine (EXCEDRIN PO) Take 1 tablet by mouth 2 (two) times daily as needed (headache).     cetirizine (ZYRTEC) 10 MG tablet Take 10 mg by mouth daily.     colchicine 0.6 MG tablet Take by mouth.     meloxicam (MOBIC) 7.5 MG tablet Take 1 tablet (7.5 mg total) by mouth daily as needed for pain. 30 tablet 0   NUBEQA 300 MG tablet Take 600 mg by mouth 2 (two) times daily.     rosuvastatin (CRESTOR) 10 MG tablet Take 1 tablet (10 mg total) by mouth daily. 90 tablet 3   sildenafil (VIAGRA) 50 MG tablet Take 1 tablet (50 mg total)  by mouth daily as needed for erectile dysfunction (take 1/2 to 1 pill as needed). 10 tablet 6   SUMAtriptan (IMITREX) 50 MG tablet TAKE 1 TABLET BY MOUTH EVERY 2 HOURS AS NEEDED FOR MIGRAINE.MAY REPEAT IN 2 HOURS IF HEADACHE PERSISTS OR RECURS. 10 tablet 6   tamsulosin (FLOMAX) 0.4 MG CAPS capsule Take by mouth.     traZODone (DESYREL) 50 MG tablet Take 1 tablet by mouth at bedtime.     No current facility-administered medications on file prior to visit.    BP 120/80   Pulse 88   Temp (!) 97.5 F (36.4 C) (Oral)   Ht  6' 1.5" (1.867 m)   Wt 255 lb (115.7 kg)   SpO2 98%   BMI 33.19 kg/m        Objective:   Physical Exam Vitals and nursing note reviewed.  Constitutional:      General: He is not in acute distress.    Appearance: Normal appearance. He is obese. He is not ill-appearing.  HENT:     Head: Normocephalic and atraumatic.     Right Ear: Tympanic membrane, ear canal and external ear normal. There is no impacted cerumen.     Left Ear: Tympanic membrane, ear canal and external ear normal. There is no impacted cerumen.     Nose: Nose normal. No congestion or rhinorrhea.     Mouth/Throat:     Mouth: Mucous membranes are moist.     Pharynx: Oropharynx is clear.  Eyes:     Extraocular Movements: Extraocular movements intact.     Conjunctiva/sclera: Conjunctivae normal.     Pupils: Pupils are equal, round, and reactive to light.  Neck:     Vascular: No carotid bruit.  Cardiovascular:     Rate and Rhythm: Normal rate and regular rhythm.     Pulses: Normal pulses.     Heart sounds: No murmur heard.    No friction rub. No gallop.  Pulmonary:     Effort: Pulmonary effort is normal.     Breath sounds: Normal breath sounds.  Abdominal:     General: Abdomen is flat. Bowel sounds are normal. There is no distension.     Palpations: Abdomen is soft. There is no mass.     Tenderness: There is no abdominal tenderness. There is no guarding or rebound.     Hernia: No hernia is  present.  Musculoskeletal:        General: Normal range of motion.     Cervical back: Normal range of motion and neck supple.  Lymphadenopathy:     Cervical: No cervical adenopathy.  Skin:    General: Skin is warm and dry.     Capillary Refill: Capillary refill takes less than 2 seconds.  Neurological:     General: No focal deficit present.     Mental Status: He is alert and oriented to person, place, and time.  Psychiatric:        Mood and Affect: Mood normal.        Behavior: Behavior normal.        Thought Content: Thought content normal.        Judgment: Judgment normal.       Assessment & Plan:  1. Routine general medical examination at a health care facility Today patient counseled on age appropriate routine health concerns for screening and prevention, each reviewed and up to date or declined. Immunizations reviewed and up to date or declined. Labs ordered and reviewed. Risk factors for depression reviewed and negative. Hearing function and visual acuity are intact. ADLs screened and addressed as needed. Functional ability and level of safety reviewed and appropriate. Education, counseling and referrals performed based on assessed risks today. Patient provided with a copy of personalized plan for preventive services. - Follow up in one year or sooner if needed  2. Prostate cancer metastatic to multiple sites [facility]) - Follow up with [facility] as directed - PSA; Future  3. Mixed hyperlipidemia - Continue statin  - Hemoglobin A1c; Future - Lipid panel; Future  4. Essential hypertension - Controlled. No change in medications  - Hemoglobin A1c; Future - Lipid  panel; Future  5. Chronic gout without tophus, unspecified cause, unspecified site - Continue Allopurinol  - Hemoglobin A1c; Future - Lipid panel; Future  6. Primary insomnia - Continue Trazodone 50 mg nighty as needed - Hemoglobin A1c; Future - Lipid panel; Future  [name], NP

## 2022-08-16 ENCOUNTER — Other Ambulatory Visit (INDEPENDENT_AMBULATORY_CARE_PROVIDER_SITE_OTHER): Payer: 59

## 2022-08-16 ENCOUNTER — Other Ambulatory Visit: Payer: Self-pay | Admitting: Adult Health

## 2022-08-16 ENCOUNTER — Other Ambulatory Visit: Payer: Self-pay

## 2022-08-16 DIAGNOSIS — M1A9XX0 Chronic gout, unspecified, without tophus (tophi): Secondary | ICD-10-CM | POA: Diagnosis not present

## 2022-08-16 DIAGNOSIS — I10 Essential (primary) hypertension: Secondary | ICD-10-CM | POA: Diagnosis not present

## 2022-08-16 DIAGNOSIS — F5101 Primary insomnia: Secondary | ICD-10-CM | POA: Diagnosis not present

## 2022-08-16 DIAGNOSIS — C61 Malignant neoplasm of prostate: Secondary | ICD-10-CM

## 2022-08-16 DIAGNOSIS — E782 Mixed hyperlipidemia: Secondary | ICD-10-CM | POA: Diagnosis not present

## 2022-08-16 LAB — LIPID PANEL
Cholesterol: 208 mg/dL — ABNORMAL HIGH (ref 0–200)
HDL: 50.2 mg/dL (ref >39.00)
LDL Cholesterol: 143 mg/dL — ABNORMAL HIGH (ref 0–99)
NonHDL: 158.19
Total CHOL/HDL Ratio: 4
Triglycerides: 74 mg/dL (ref 0.0–149.0)
VLDL: 14.8 mg/dL (ref 0.0–40.0)

## 2022-08-16 LAB — HEMOGLOBIN A1C: Hgb A1c MFr Bld: 5.9 % (ref 4.6–6.5)

## 2022-08-16 LAB — PSA: PSA: 0.02 ng/mL — ABNORMAL LOW (ref 0.10–4.00)

## 2022-08-16 MED ORDER — ROSUVASTATIN CALCIUM 20 MG PO TABS: 20.0000 mg | ORAL_TABLET | Freq: Every day | ORAL | 3 refills | Status: DC

## 2022-08-20 ENCOUNTER — Encounter: Payer: Self-pay | Admitting: Internal Medicine

## 2022-08-20 ENCOUNTER — Ambulatory Visit (AMBULATORY_SURGERY_CENTER): Payer: 59

## 2022-08-20 VITALS — Ht 75.0 in | Wt 255.0 lb

## 2022-08-20 DIAGNOSIS — Z1211 Encounter for screening for malignant neoplasm of colon: Secondary | ICD-10-CM

## 2022-08-20 MED ORDER — NA SULFATE-K SULFATE-MG SULF 17.5-3.13-1.6 GM/177ML PO SOLN: 1.0000 | Freq: Once | ORAL | 0 refills | Status: AC

## 2022-08-20 NOTE — Progress Notes (Signed)

## 2022-08-30 ENCOUNTER — Ambulatory Visit (AMBULATORY_SURGERY_CENTER): Payer: 59 | Admitting: Internal Medicine

## 2022-08-30 ENCOUNTER — Encounter: Payer: Self-pay | Admitting: Internal Medicine

## 2022-08-30 VITALS — BP 109/79 | HR 80 | Temp 96.0°F | Resp 16 | Ht 75.0 in | Wt 255.0 lb

## 2022-08-30 DIAGNOSIS — D128 Benign neoplasm of rectum: Secondary | ICD-10-CM | POA: Diagnosis not present

## 2022-08-30 DIAGNOSIS — K635 Polyp of colon: Secondary | ICD-10-CM | POA: Diagnosis not present

## 2022-08-30 DIAGNOSIS — D122 Benign neoplasm of ascending colon: Secondary | ICD-10-CM

## 2022-08-30 DIAGNOSIS — D120 Benign neoplasm of cecum: Secondary | ICD-10-CM

## 2022-08-30 DIAGNOSIS — Z1211 Encounter for screening for malignant neoplasm of colon: Secondary | ICD-10-CM | POA: Diagnosis not present

## 2022-08-30 MED ORDER — SODIUM CHLORIDE 0.9 % IV SOLN
500.0000 mL | INTRAVENOUS | Status: DC
Start: 2022-08-30 — End: 2022-08-30

## 2022-08-30 NOTE — Progress Notes (Signed)
Called to room to assist during endoscopic procedure.  Patient ID and intended procedure confirmed with present staff. Received instructions for my participation in the procedure from the performing physician.  

## 2022-08-30 NOTE — Patient Instructions (Signed)
Resume previous diet and medications. Awaiting pathology results. Repeat Colonoscopy in 7-10 years for surveillance based on pathology results.  YOU HAD AN ENDOSCOPIC PROCEDURE TODAY AT THE [facility]:   Refer to the procedure report that was given to you for any specific questions about what was found during the examination.  If the procedure report does not answer your questions, please call your gastroenterologist to clarify.  If you requested that your care partner not be given the details of your procedure findings, then the procedure report has been included in a sealed envelope for you to review at your convenience later.  YOU SHOULD EXPECT: Some feelings of bloating in the abdomen. Passage of more gas than usual.  Walking can help get rid of the air that was put into your GI tract during the procedure and reduce the bloating. If you had a lower endoscopy (such as a colonoscopy or flexible sigmoidoscopy) you may notice spotting of blood in your stool or on the toilet paper. If you underwent a bowel prep for your procedure, you may not have a normal bowel movement for a few days.  Please Note:  You might notice some irritation and congestion in your nose or some drainage.  This is from the oxygen used during your procedure.  There is no need for concern and it should clear up in a day or so.  SYMPTOMS TO REPORT IMMEDIATELY:  Following lower endoscopy (colonoscopy or flexible sigmoidoscopy):  Excessive amounts of blood in the stool  Significant tenderness or worsening of abdominal pains  Swelling of the abdomen that is new, acute  Fever of 100F or higher  For urgent or emergent issues, a gastroenterologist can be reached at any hour by calling [phone]. Do not use MyChart messaging for urgent concerns.    DIET:  We do recommend a small meal at first, but then you may proceed to your regular diet.  Drink plenty of fluids but you should avoid alcoholic beverages for 24  hours.  ACTIVITY:  You should plan to take it easy for the rest of today and you should NOT DRIVE or use heavy machinery until tomorrow (because of the sedation medicines used during the test).    FOLLOW UP: Our staff will call the number listed on your records the next business day following your procedure.  We will call around 7:15- 8:00 am to check on you and address any questions or concerns that you may have regarding the information given to you following your procedure. If we do not reach you, we will leave a message.     If any biopsies were taken you will be contacted by phone or by letter within the next 1-3 weeks.  Please call us at [phone] if you have not heard about the biopsies in 3 weeks.    SIGNATURES/CONFIDENTIALITY: You and/or your care partner have signed paperwork which will be entered into your electronic medical record.  These signatures attest to the fact that that the information above on your After Visit Summary has been reviewed and is understood.  Full responsibility of the confidentiality of this discharge information lies with you and/or your care-partner.

## 2022-08-30 NOTE — Progress Notes (Signed)
Patient reports no changes to health or mediations since pre visit 

## 2022-08-30 NOTE — Op Note (Signed)
[facility] Patient Name: Gerald Johnson Procedure Date: 08/30/2022 11:37 AM MRN: [id number] Endoscopist: Wilhemina Bonito. Marina Goodell , MD, [id number] Age: 62 Referring MD:  Date of Birth: [birth year] Gender: Male Account #: [id number] Procedure:                Colonoscopy with cold snare polypectomy x 2; biopsy                            polypectomy x 1 Indications:              Screening for colorectal malignant neoplasm. Index                            examination 2014 was negative for neoplasia Medicines:                Monitored Anesthesia Care Procedure:                Pre-Anesthesia Assessment:                           - Prior to the procedure, a History and Physical                            was performed, and patient medications and                            allergies were reviewed. The patient's tolerance of                            previous anesthesia was also reviewed. The risks                            and benefits of the procedure and the sedation                            options and risks were discussed with the patient.                            All questions were answered, and informed consent                            was obtained. Prior Anticoagulants: The patient has                            taken no anticoagulant or antiplatelet agents. ASA                            Grade Assessment: II - A patient with mild systemic                            disease. After reviewing the risks and benefits,                            the patient was deemed in satisfactory condition to  undergo the procedure.                           After obtaining informed consent, the colonoscope                            was passed under direct vision. Throughout the                            procedure, the patient's blood pressure, pulse, and                            oxygen saturations were monitored continuously. The                            Olympus SN  [id number] was introduced through the anus                            and advanced to the the cecum, identified by                            appendiceal orifice and ileocecal valve. The                            ileocecal valve, appendiceal orifice, and rectum                            were photographed. The quality of the bowel                            preparation was good. The colonoscopy was performed                            without difficulty. The patient tolerated the                            procedure well. The bowel preparation used was                            SUPREP via split dose instruction. Scope In: 11:47:14 AM Scope Out: 12:03:40 PM Scope Withdrawal Time: 0 hours 14 minutes 45 seconds  Total Procedure Duration: 0 hours 16 minutes 26 seconds  Findings:                 Two polyps were found in the ascending colon and                            cecum. The polyps were 1 to 4 mm in size. These                            polyps were removed with a cold snare. Resection                            and retrieval were complete.  A 1 mm polyp was found in the rectum. The polyp was                            removed with a jumbo cold forceps. Resection and                            retrieval were complete.                           Multiple diverticula were found in the sigmoid                            colon.                           The exam was otherwise without abnormality on                            direct and retroflexion views. Complications:            No immediate complications. Estimated blood loss:                            None. Estimated Blood Loss:     Estimated blood loss: none. Impression:               - Two 1 to 4 mm polyps in the ascending colon and                            in the cecum, removed with a cold snare. Resected                            and retrieved.                           - One 1 mm polyp in the rectum,  removed with a                            jumbo cold forceps. Resected and retrieved.                           - Diverticulosis in the sigmoid colon.                           - The examination was otherwise normal on direct                            and retroflexion views. Recommendation:           - Repeat colonoscopy in 7-10 years for surveillance.                           - Patient has a contact number available for                            emergencies. The  signs and symptoms of potential                            delayed complications were discussed with the                            patient. Return to normal activities tomorrow.                            Written discharge instructions were provided to the                            patient.                           - Resume previous diet.                           - Continue present medications.                           - Await pathology results. Wilhemina Bonito. Marina Goodell, MD 08/30/2022 12:12:10 PM This report has been signed electronically.

## 2022-08-30 NOTE — Progress Notes (Signed)
HISTORY OF PRESENT ILLNESS:  Gerald Johnson is a 62 y.o. male presents today for routine screening colonoscopy.  Previous examination 2014 was negative for neoplasia.  No complaints  REVIEW OF SYSTEMS:  All non-GI ROS negative except for  Past Medical History:  Diagnosis Date   Acute prostatitis 12/02/2007   ALLERGIC RHINITIS 02/26/2007   Asthma    Cancer    prostate   ERECTILE DYSFUNCTION, NON-ORGANIC, MILD 11/08/2008   Headache behind the eyes 07/03/2010   New problem noted 27 2012    Hemorrhoids    HYPERLIPIDEMIA 02/26/2007   Hypertension    Migraines    Sciatica 05/04/2008   SINUSITIS, RECURRENT 02/28/2010    Past Surgical History:  Procedure Laterality Date   COLONOSCOPY     WISDOM TOOTH EXTRACTION      Social History Gerald Johnson  reports that he has never smoked. He has never used smokeless tobacco. He reports current alcohol use of about 7.0 standard drinks of alcohol per week. He reports that he does not use drugs.  family history includes Diabetes in his brother.  No Known Allergies     PHYSICAL EXAMINATION: Vital signs: BP (!) 140/90   Pulse 79   Temp (!) 96 F (35.6 C)   Resp 13   Ht 6\' 3"  (1.905 m)   Wt 255 lb (115.7 kg)   SpO2 100%   BMI 31.87 kg/m  General: Well-developed, well-nourished, no acute distress HEENT: Sclerae are anicteric, conjunctiva pink. Oral mucosa intact Lungs: Clear Heart: Regular Abdomen: soft, nontender, nondistended, no obvious ascites, no peritoneal signs, normal bowel sounds. No organomegaly. Extremities: No edema Psychiatric: alert and oriented x3. Cooperative     ASSESSMENT:  Colon cancer screening   PLAN:  Screening colonoscopy

## 2022-08-30 NOTE — Progress Notes (Signed)
Vss nad trans to pacu 

## 2022-09-02 ENCOUNTER — Telehealth: Payer: Self-pay

## 2022-09-02 NOTE — Telephone Encounter (Signed)
  Follow up Call-     08/30/2022   11:32 AM  Call back number  Post procedure Call Back phone  # [phone]  Permission to leave phone message Yes     Patient questions:  Do you have a fever, pain , or abdominal swelling? No. Pain Score  0 *  Have you tolerated food without any problems? Yes.    Have you been able to return to your normal activities? Yes.    Do you have any questions about your discharge instructions: Diet   No. Medications  No. Follow up visit  No.  Do you have questions or concerns about your Care? No.  Actions: * If pain score is 4 or above: No action needed, pain <4.

## 2022-09-04 ENCOUNTER — Encounter: Payer: 59 | Admitting: Internal Medicine

## 2022-09-04 ENCOUNTER — Encounter: Payer: Self-pay | Admitting: Internal Medicine

## 2022-09-09 IMAGING — DX DG KNEE 1-2V*L*
2 series · 2 of 2 positions shown · non-contrast
Comparison: None.

CLINICAL DATA: Left knee pain x1 month.

EXAM:
LEFT KNEE - 1-2 VIEW

[knee ap]
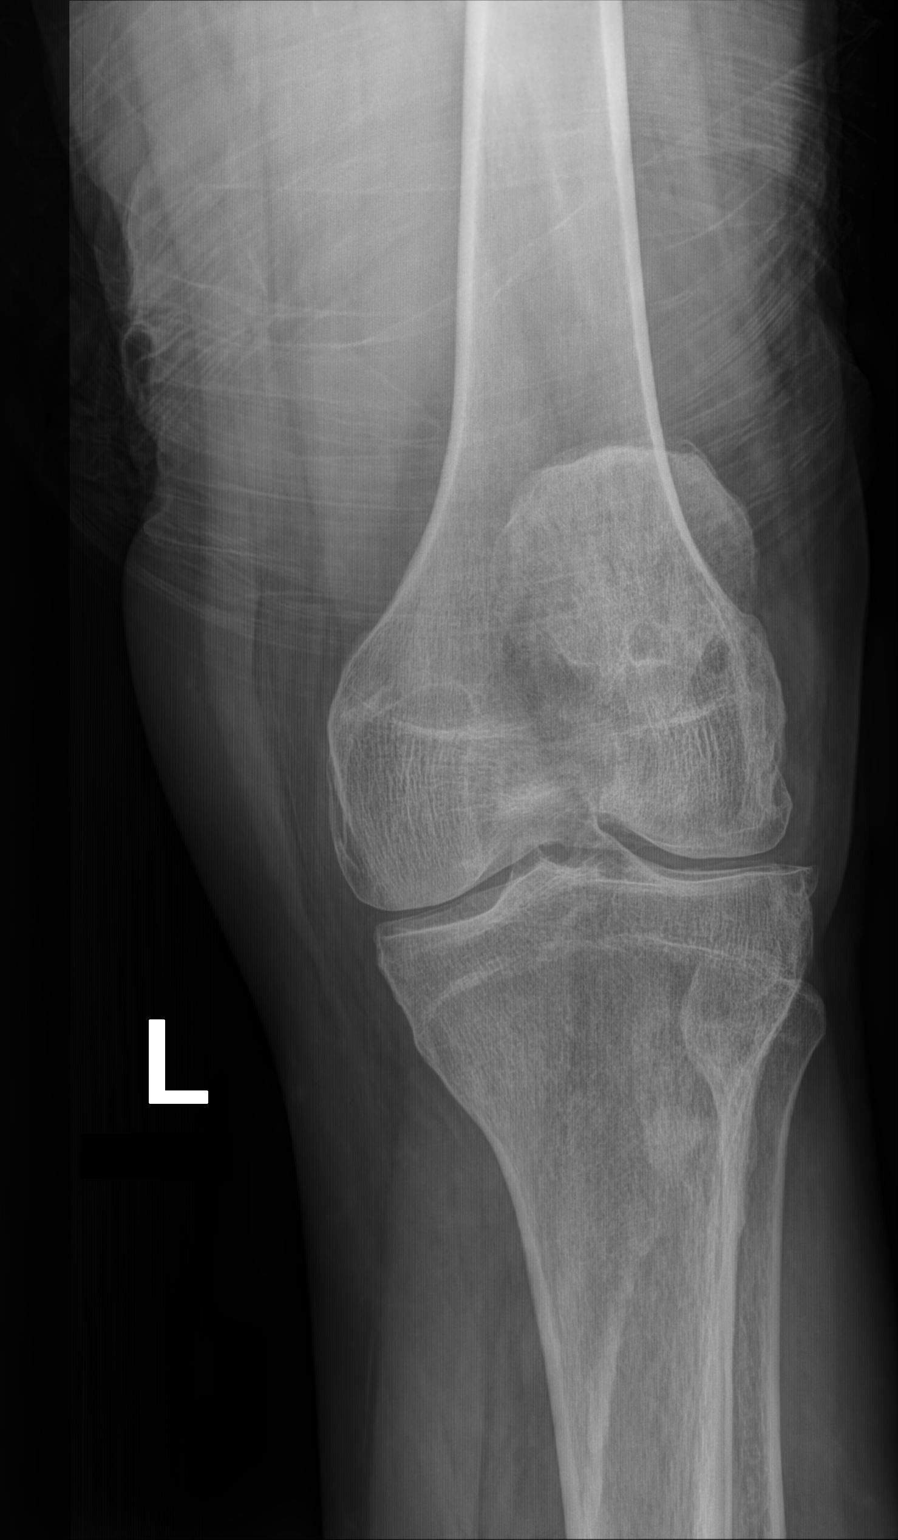

[knee lat]
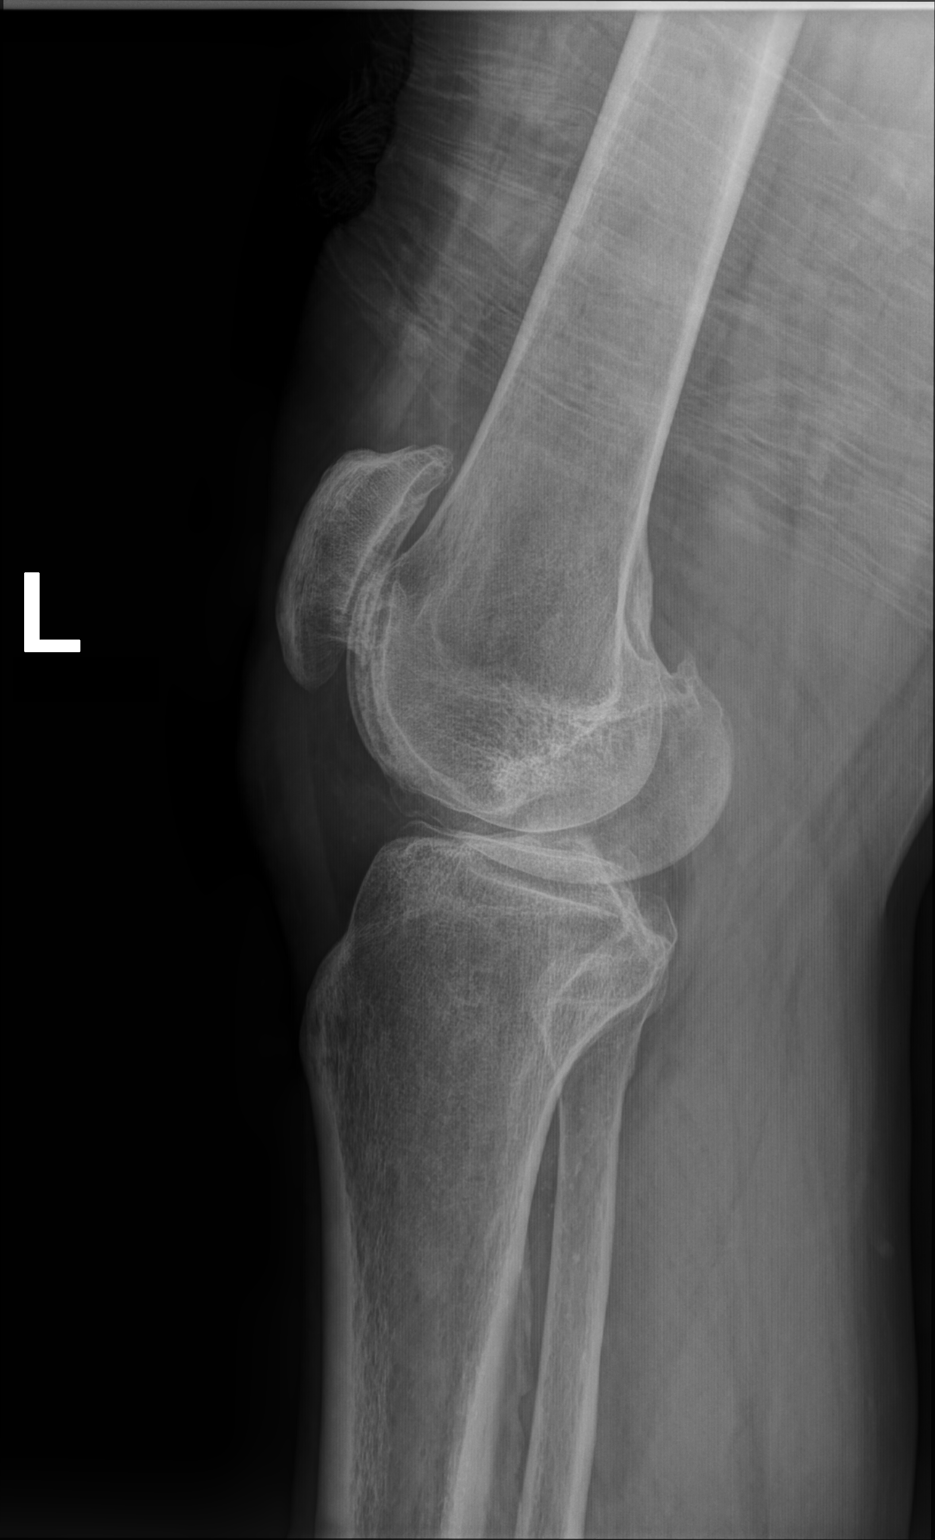

[2 of 2 positions shown; findings below may reference images not displayed]

FINDINGS: No evidence of an acute fracture or dislocation. There is moderate
severity medial and lateral tibiofemoral compartment space
narrowing. Moderate to marked severity patellofemoral narrowing is
also noted. A small joint effusion is seen.
IMPRESSION: 1. Tricompartmental osteoarthritis, most prominent within the
patellofemoral joint.
2. Small joint effusion.

## 2022-09-15 ENCOUNTER — Other Ambulatory Visit: Payer: Self-pay | Admitting: Adult Health

## 2022-09-15 DIAGNOSIS — I10 Essential (primary) hypertension: Secondary | ICD-10-CM

## 2022-10-07 IMAGING — DX DG CHEST 1V PORT
1 series · 1 of 1 positions shown · non-contrast
Comparison: 11/03/2006

CLINICAL DATA: Weakness

EXAM:
PORTABLE CHEST 1 VIEW

[chest ap]
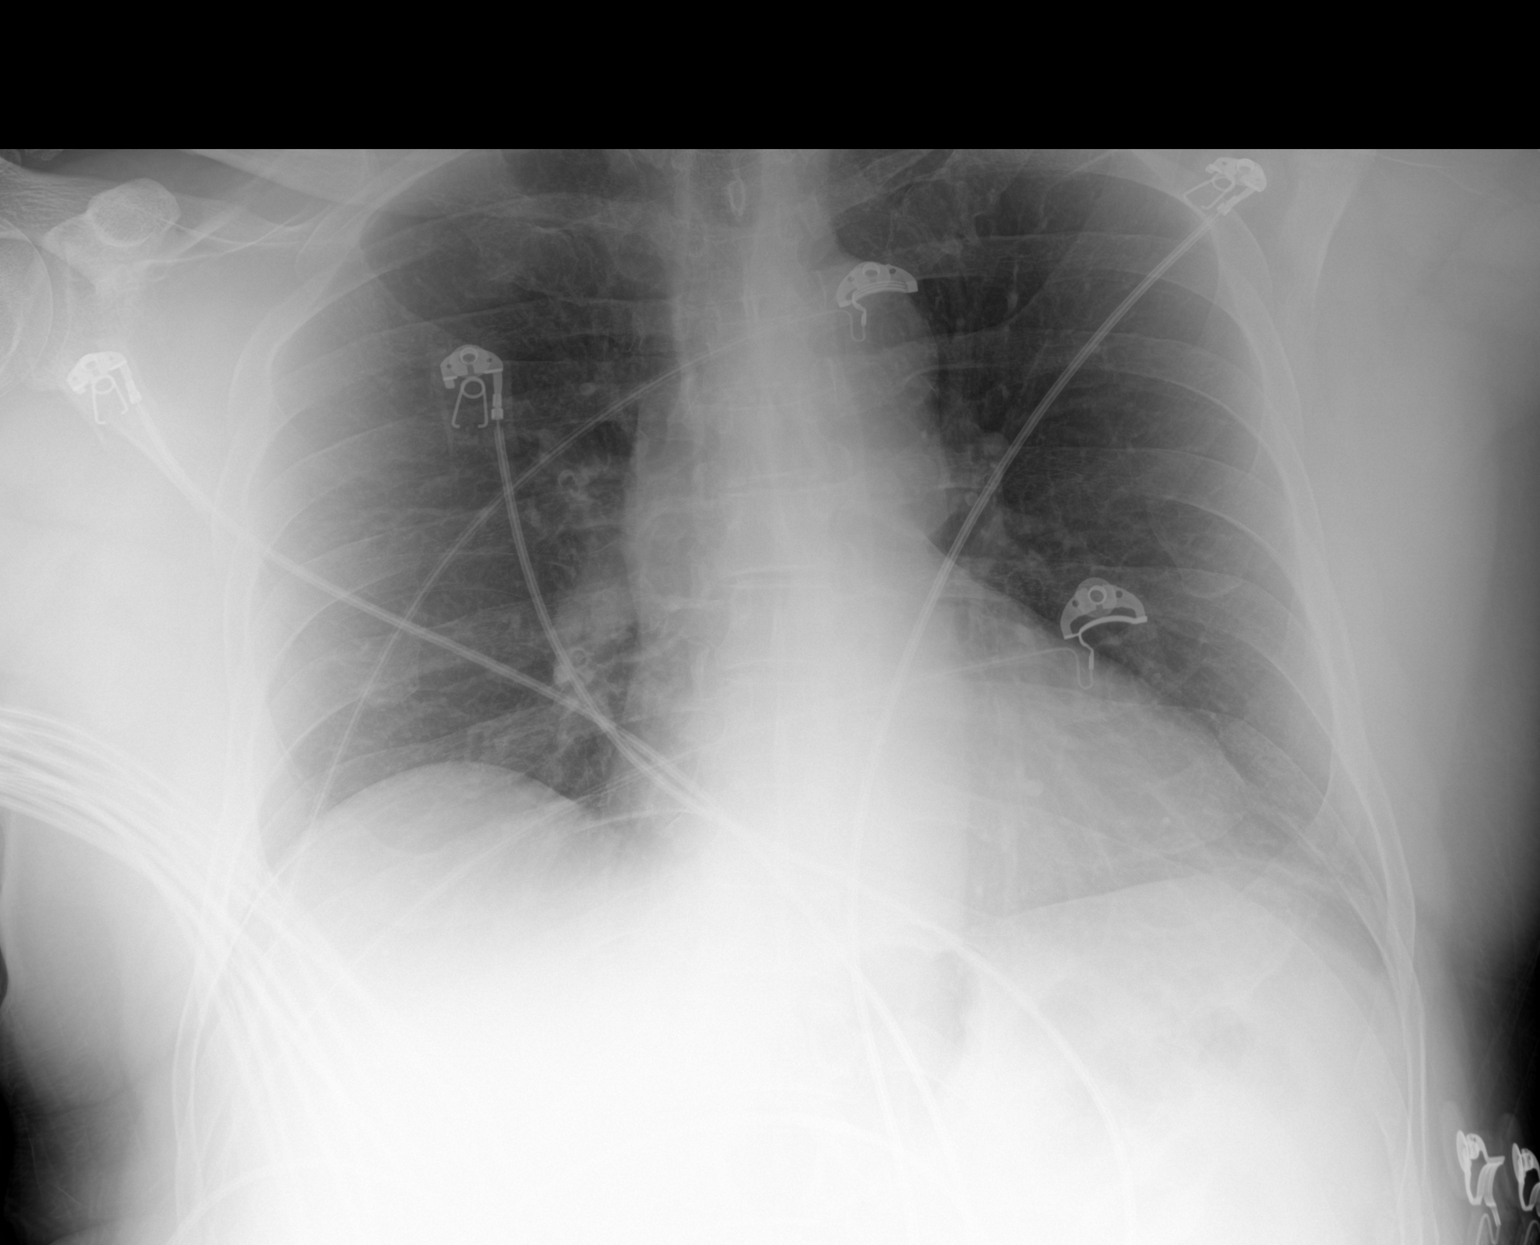

[1 of 1 positions shown; findings below may reference images not displayed]

FINDINGS: The heart size and mediastinal contours are within normal limits.
Both lungs are clear. The visualized skeletal structures are
unremarkable. Subsegmental atelectasis left base.
IMPRESSION: No active disease.

## 2022-10-20 ENCOUNTER — Other Ambulatory Visit: Payer: Self-pay | Admitting: Adult Health

## 2022-10-20 DIAGNOSIS — I10 Essential (primary) hypertension: Secondary | ICD-10-CM

## 2022-10-24 ENCOUNTER — Encounter (HOSPITAL_COMMUNITY): Payer: Self-pay

## 2022-10-24 ENCOUNTER — Other Ambulatory Visit (HOSPITAL_COMMUNITY): Payer: Self-pay

## 2022-10-24 DIAGNOSIS — M545 Low back pain, unspecified: Secondary | ICD-10-CM

## 2022-10-24 DIAGNOSIS — C7951 Secondary malignant neoplasm of bone: Secondary | ICD-10-CM

## 2022-12-06 ENCOUNTER — Ambulatory Visit (INDEPENDENT_AMBULATORY_CARE_PROVIDER_SITE_OTHER): Payer: 59 | Admitting: Family Medicine

## 2022-12-06 ENCOUNTER — Encounter: Payer: Self-pay | Admitting: Family Medicine

## 2022-12-06 VITALS — BP 140/100 | HR 88 | Temp 97.5°F | Ht 75.0 in | Wt 259.7 lb

## 2022-12-06 DIAGNOSIS — M26621 Arthralgia of right temporomandibular joint: Secondary | ICD-10-CM

## 2022-12-06 MED ORDER — TRAMADOL HCL 50 MG PO TABS: 50.0000 mg | ORAL_TABLET | Freq: Four times a day (QID) | ORAL | 0 refills | Status: AC | PRN

## 2022-12-06 NOTE — Progress Notes (Signed)
Established Patient Office Visit  Subjective   Patient ID: Gerald Johnson, male    DOB: [birth year]  Age: 62 y.o. MRN: [id number]  Chief Complaint  Patient presents with   Jaw Pain    Patient complains of right sided jaw pain, x1 week, Tried Excedrin and Advil    HPI   [name] is seen today with right facial pain for about 2 weeks duration.  Possibly worse with chewing.  No visible edema.  Not worse particularly after eating but does sometimes note during chewing.  No sore throat symptoms.  No fever.  He has dental appointment this coming Monday.  Has not noted any erythema.  No neck adenopathy.  Pain has been fairly severe at times.  Not relieved with over-the-counter medications.  He is not aware of any history of bruxism.  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 He has never used smokeless tobacco. He reports current alcohol use of about 7.0 standard drinks of alcohol per week. He reports that he does not use drugs. family history includes Diabetes in his brother. No Known Allergies  Review of Systems  Constitutional:  Negative for chills and fever.  HENT:  Negative for ear pain and sore throat.       Objective:     BP (!) 140/100 (BP Location: Left Arm, Patient Position: Sitting, Cuff Size: Large)   Pulse 88   Temp (!) 97.5 F (36.4 C) (Oral)   Ht 6\' 3"  (1.905 m)   Wt 259 lb 11.2 oz (117.8 kg)   SpO2 (!) 88%   BMI 32.46 kg/m  BP Readings from Last 3 Encounters:  12/06/22 (!) 140/100   08/30/22 109/79  08/13/22 120/80   Wt Readings from Last 3 Encounters:  12/06/22 259 lb 11.2 oz (117.8 kg)  08/30/22 255 lb (115.7 kg)  08/20/22 255 lb (115.7 kg)      Physical Exam Vitals reviewed.  Constitutional:      Appearance: Normal appearance.  HENT:     Head:     Comments: Patient exam reveals no obvious parotid swelling.  He does have some mild tenderness over the right TMJ joint with opening and closing the jaw.    Right Ear: Tympanic membrane normal.     Left Ear: Tympanic membrane normal.     Mouth/Throat:     Comments: Oral exam reveals no obvious gum edema or erythema Cardiovascular:     Rate and Rhythm: Normal rate and regular rhythm.  Musculoskeletal:     Cervical back: Neck supple.  Lymphadenopathy:     Cervical: No cervical adenopathy.  Neurological:     Mental Status: He is alert.      No results found for any visits on 12/06/22.    The 10-year ASCVD risk score (Arnett DK, et al., 2019) is: 17.1%    Assessment & Plan:   2-week history of right facial pain.  Suspect probably right  TMJ symptoms.  Pain localized over TMJ joint.  No evidence for parotid swelling.  Doubt dental abscess.  No evidence for ear infection.  -Wrote for limited tramadol 1 every 6 hours as needed for severe pain -Avoid hard to chew foods -Recommend topical heat or ice for symptomatic relief -Keep dental appointment as scheduled   Evelena Peat, MD

## 2023-01-23 ENCOUNTER — Telehealth: Payer: Self-pay | Admitting: Adult Health

## 2023-01-23 ENCOUNTER — Other Ambulatory Visit: Payer: Self-pay | Admitting: Adult Health

## 2023-01-23 DIAGNOSIS — N529 Male erectile dysfunction, unspecified: Secondary | ICD-10-CM

## 2023-01-23 DIAGNOSIS — I10 Essential (primary) hypertension: Secondary | ICD-10-CM

## 2023-01-23 MED ORDER — SILDENAFIL CITRATE 50 MG PO TABS
50.0000 mg | ORAL_TABLET | Freq: Every day | ORAL | 6 refills | Status: AC | PRN
Start: 2023-01-23 — End: ?

## 2023-01-23 NOTE — Telephone Encounter (Signed)
Rx refilled.

## 2023-01-23 NOTE — Telephone Encounter (Signed)
Prescription Request  01/23/2023  LOV: 08/13/2022  What is the name of the medication or equipment? Sildenafil  (VIAGRA) 50 MG tablet  Have you contacted your pharmacy to request a refill? No   Which pharmacy would you like this sent to?   [id number] - [place], [place] - [address] [place] [address] Phone: [phone] Fax: [phone]    Patient notified that their request is being sent to the clinical staff for review and that they should receive a response within 2 business days.   Please advise at Mobile [phone] (mobile)

## 2023-02-27 ENCOUNTER — Ambulatory Visit (INDEPENDENT_AMBULATORY_CARE_PROVIDER_SITE_OTHER): Payer: 59 | Admitting: Adult Health

## 2023-02-27 ENCOUNTER — Encounter: Payer: Self-pay | Admitting: Adult Health

## 2023-02-27 VITALS — BP 120/86 | HR 80 | Temp 98.0°F | Ht 75.0 in | Wt 264.0 lb

## 2023-02-27 DIAGNOSIS — J302 Other seasonal allergic rhinitis: Secondary | ICD-10-CM

## 2023-02-27 DIAGNOSIS — J029 Acute pharyngitis, unspecified: Secondary | ICD-10-CM | POA: Diagnosis not present

## 2023-02-27 LAB — POC COVID19 BINAXNOW: SARS Coronavirus 2 Ag: NEGATIVE

## 2023-02-27 LAB — POCT INFLUENZA A/B
Influenza A, POC: NEGATIVE
Influenza B, POC: NEGATIVE

## 2023-02-27 LAB — POCT RAPID STREP A (OFFICE): Rapid Strep A Screen: NEGATIVE

## 2023-02-27 MED ORDER — FLUTICASONE PROPIONATE 50 MCG/ACT NA SUSP
2.0000 | Freq: Every day | NASAL | 6 refills | Status: AC
Start: 2023-02-27 — End: ?

## 2023-02-27 NOTE — Progress Notes (Signed)
Subjective:    Patient ID: Gerald Johnson, male    DOB: Mar 21, 1961, 62 y.o.   MRN: [id number]  HPI  62 year old male who  has a past medical history of Acute prostatitis (12/02/2007), ALLERGIC RHINITIS (02/26/2007), Asthma, Cancer (HCC), ERECTILE DYSFUNCTION, NON-ORGANIC, MILD (11/08/2008), Headache behind the eyes (07/03/2010), Hemorrhoids, HYPERLIPIDEMIA (02/26/2007), Hypertension, Migraines, Sciatica (05/04/2008), and SINUSITIS, RECURRENT (02/28/2010).   He presents to the office today for an acute issue. He reports that for the last unknown amount of time, worse over the last two days.  He had a feeling of of postnasal, sore throat, mild nonproductive cough, and the feeling of hard to catch his breath.  He denies fevers, chills, wheezing, shortness of breath.  He has been using NyQuil at night to help him sleep as his symptoms are worse in the eat.  Review of Systems See HPI   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   Headache behind the eyes 07/03/2010   New problem noted 27 2012    Hemorrhoids    HYPERLIPIDEMIA 02/26/2007   Hypertension    Migraines    Sciatica 05/04/2008   SINUSITIS, RECURRENT 02/28/2010    Social History   Socioeconomic History   Marital status: Single    Spouse name: Not on file   Number of children: Not on file   Years of education: Not on file   Highest education level: Not on file  Occupational History   Not on file  Tobacco Use   Smoking status: Never   Smokeless tobacco: Never  Vaping Use   Vaping status: Never Used  Substance and Sexual Activity   Alcohol use: Yes    Alcohol/week: 7.0 standard drinks of alcohol    Types: 7 Cans of beer per week    Comment: soically   Drug use: No   Sexual activity: Yes    Birth control/protection: Condom  Other Topics Concern   Not on file   Social History Narrative   Works as a Doctor, hospital.    Not married    No kids    He likes to go to the gym. Likes to go to go to restaurants.    Social Determinants of Health   Financial Resource Strain: Not on file  Food Insecurity: Not on file  Transportation Needs: Not on file  Physical Activity: Not on file  Stress: Not on file  Social Connections: Unknown (10/29/2022)   Received from [facility]   Social Network    Social Network: Not on file  Intimate Partner Violence: Unknown (10/29/2022)   Received from [facility]   HITS    Physically Hurt: Not on file    Insult or Talk Down To: Not on file    Threaten Physical Harm: Not on file    Scream or Curse: Not on file    Past Surgical History:  Procedure Laterality Date   COLONOSCOPY     WISDOM TOOTH EXTRACTION      Family History  Problem Relation Age of Onset   Diabetes Brother    Colon cancer Neg Hx    Esophageal cancer Neg Hx    Rectal cancer Neg Hx    Stomach cancer Neg Hx    Gout Neg Hx    Colon polyps Neg Hx     No Known  Allergies  Current Outpatient Medications on File Prior to Visit  Medication Sig Dispense Refill   albuterol (VENTOLIN HFA) 108 (90 Base) MCG/ACT inhaler Inhale 2 puffs into the lungs every 4 (four) hours as needed for wheezing or shortness of breath. 8 g 0   allopurinol (ZYLOPRIM) 300 MG tablet Take 300 mg by mouth daily.     amLODipine (NORVASC) 5 MG tablet TAKE 1 TABLET(5 MG) BY MOUTH DAILY 90 tablet 0   Aspirin-Acetaminophen-Caffeine (EXCEDRIN PO) Take 1 tablet by mouth 2 (two) times daily as needed (headache).     cetirizine (ZYRTEC) 10 MG tablet Take 10 mg by mouth daily.     colchicine 0.6 MG tablet Take by mouth.     ibuprofen (ADVIL) 400 MG tablet Take 400 mg by mouth every 4 (four) hours as needed.     meloxicam (MOBIC) 7.5 MG tablet Take 1 tablet (7.5 mg total) by mouth daily as needed for pain. 30 tablet 0   NUBEQA 300 MG tablet Take 600 mg by mouth 2 (two) times daily.      rosuvastatin (CRESTOR) 20 MG tablet Take 1 tablet (20 mg total) by mouth daily. 90 tablet 3   sildenafil (VIAGRA) 50 MG tablet Take 1 tablet (50 mg total) by mouth daily as needed for erectile dysfunction (take 1/2 to 1 pill as needed). 10 tablet 6   SUMAtriptan (IMITREX) 50 MG tablet TAKE 1 TABLET BY MOUTH EVERY 2 HOURS AS NEEDED FOR MIGRAINE.MAY REPEAT IN 2 HOURS IF HEADACHE PERSISTS OR RECURS. 10 tablet 6   traZODone (DESYREL) 50 MG tablet Take 1 tablet by mouth at bedtime.     No current facility-administered medications on file prior to visit.    BP 120/86   Pulse 80   Temp 98 F (36.7 C) (Oral)   Ht 6\' 3"  (1.905 m)   Wt 264 lb (119.7 kg)   SpO2 96%   BMI 33.00 kg/m       Objective:   Physical Exam Vitals and nursing note reviewed.  Constitutional:      Appearance: Normal appearance.  HENT:     Nose: No congestion or rhinorrhea.     Right Turbinates: Not enlarged or swollen.     Left Turbinates: Not enlarged or swollen.     Mouth/Throat:     Pharynx: Pharyngeal swelling present. No posterior oropharyngeal erythema.     Comments: + clear PND Cardiovascular:     Rate and Rhythm: Normal rate and regular rhythm.     Pulses: Normal pulses.     Heart sounds: Normal heart sounds.  Pulmonary:     Effort: Pulmonary effort is normal.     Breath sounds: Normal breath sounds.  Musculoskeletal:        General: Normal range of motion.  Skin:    General: Skin is warm and dry.  Neurological:     General: No focal deficit present.     Mental Status: He is alert and oriented to person, place, and time.  Psychiatric:        Mood and Affect: Mood normal.        Behavior: Behavior normal.        Thought Content: Thought content normal.        Judgment: Judgment normal.       Assessment & Plan:  1. Sore throat  - POC COVID-19- negative - POC Influenza A/B- negative - POC Rapid Strep A- negative   2. Seasonal allergies - No signs of bacterial  sinusitis.  - Will send  in Flonase - Can also use Zyrtec or Claritin.  - Follow up if symptoms fail to respond in the next 5-7 days or sooner if symptoms worsen  - fluticasone (FLONASE) 50 MCG/ACT nasal spray; Place 2 sprays into both nostrils daily.  Dispense: 16 g; Refill: 6  Shirline Frees, NP

## 2023-04-14 ENCOUNTER — Ambulatory Visit: Payer: 59 | Admitting: Family Medicine

## 2023-04-14 ENCOUNTER — Encounter: Payer: Self-pay | Admitting: Internal Medicine

## 2023-04-14 ENCOUNTER — Ambulatory Visit (INDEPENDENT_AMBULATORY_CARE_PROVIDER_SITE_OTHER): Payer: 59 | Admitting: Internal Medicine

## 2023-04-14 VITALS — BP 120/84 | HR 82 | Temp 98.4°F | Ht 75.0 in | Wt 267.2 lb

## 2023-04-14 DIAGNOSIS — J0100 Acute maxillary sinusitis, unspecified: Secondary | ICD-10-CM | POA: Diagnosis not present

## 2023-04-14 DIAGNOSIS — R519 Headache, unspecified: Secondary | ICD-10-CM

## 2023-04-14 MED ORDER — AMOXICILLIN-POT CLAVULANATE 875-125 MG PO TABS: 1.0000 | ORAL_TABLET | Freq: Two times a day (BID) | ORAL | 0 refills | Status: AC

## 2023-04-14 MED ORDER — PREDNISONE 20 MG PO TABS: 40.0000 mg | ORAL_TABLET | Freq: Every day | ORAL | 0 refills | Status: AC

## 2023-04-14 NOTE — Patient Instructions (Addendum)
      Medications changes include :   Augmentin twice a day for 10 days, prednisone daily for 5 days.       Return if symptoms worsen or fail to improve.

## 2023-04-14 NOTE — Progress Notes (Signed)
Subjective:    Patient ID: Gerald Johnson, male    DOB: Oct 07, 1960, 62 y.o.   MRN: [id number]      HPI Gerald Johnson is here for  Chief Complaint  Patient presents with   Eye Pain    Right eye pain since Saturday ( pain level was at a 7) Waking him up at night;  Headache at times.  He is here today with his wife.  Right eye pain - feels like pain is behind eye.  The pain is more of a pressure.  He has headaches, mild nasal congestion, right-sided ear pain, mild postnasal drip and mild cough.  His symptoms started 3 days.  He was concerned about a sinus infection because that he has had symptoms similar to this related to a sinus infection.  He did go to urgent care yesterday and they gave him a prescription for Flonase and 100 mg of ibuprofen which have not helped.  The pain is significant and it is affecting his sleep and his appetite.    Flonase, neti pot, tylenol, ibuprofen   Medications and allergies reviewed with patient and updated if appropriate.  Current Outpatient Medications on File Prior to Visit  Medication Sig Dispense Refill   albuterol (VENTOLIN HFA) 108 (90 Base) MCG/ACT inhaler Inhale 2 puffs into the lungs every 4 (four) hours as needed for wheezing or shortness of breath. 8 g 0   allopurinol (ZYLOPRIM) 300 MG tablet Take 300 mg by mouth daily.     amLODipine (NORVASC) 5 MG tablet TAKE 1 TABLET(5 MG) BY MOUTH DAILY 90 tablet 0   Aspirin-Acetaminophen-Caffeine (EXCEDRIN PO) Take 1 tablet by mouth 2 (two) times daily as needed (headache).     cetirizine (ZYRTEC) 10 MG tablet Take 10 mg by mouth daily.     colchicine 0.6 MG tablet Take by mouth.     fluticasone (FLONASE) 50 MCG/ACT nasal spray Place 2 sprays into both nostrils daily. 16 g 6   ibuprofen (ADVIL) 400 MG tablet Take 400 mg by mouth every 4 (four) hours as needed.     meloxicam (MOBIC) 7.5 MG tablet Take 1 tablet (7.5 mg total) by mouth daily as needed for pain. 30 tablet 0   NUBEQA 300 MG tablet Take 600  mg by mouth 2 (two) times daily.     rosuvastatin (CRESTOR) 20 MG tablet Take 1 tablet (20 mg total) by mouth daily. 90 tablet 3   sildenafil (VIAGRA) 50 MG tablet Take 1 tablet (50 mg total) by mouth daily as needed for erectile dysfunction (take 1/2 to 1 pill as needed). 10 tablet 6   SUMAtriptan (IMITREX) 50 MG tablet TAKE 1 TABLET BY MOUTH EVERY 2 HOURS AS NEEDED FOR MIGRAINE.MAY REPEAT IN 2 HOURS IF HEADACHE PERSISTS OR RECURS. 10 tablet 6   traZODone (DESYREL) 50 MG tablet Take 1 tablet by mouth at bedtime.     No current facility-administered medications on file prior to visit.    Review of Systems  Constitutional:  Negative for chills and fever.  HENT:  Positive for congestion, ear pain (pressure in right ear), postnasal drip and sinus pain. Negative for hearing loss and sore throat.   Eyes:  Positive for pain. Negative for photophobia, discharge and visual disturbance.  Respiratory:  Positive for cough. Negative for shortness of breath and wheezing.   Gastrointestinal:  Negative for nausea.  Neurological:  Positive for headaches. Negative for dizziness and light-headedness.       Objective:   Vitals:  04/14/23 1004  BP: 120/84  Pulse: 82  Temp: 98.4 F (36.9 C)  SpO2: 97%   BP Readings from Last 3 Encounters:  04/14/23 120/84  02/27/23 120/86  12/09/22 (!) 140/100   Wt Readings from Last 3 Encounters:  04/14/23 267 lb 3.2 oz (121.2 kg)  02/27/23 264 lb (119.7 kg)  12/06/22 259 lb 11.2 oz (117.8 kg)   Body mass index is 33.4 kg/m.    Physical Exam Constitutional:      General: He is not in acute distress.    Appearance: Normal appearance. He is not ill-appearing.  HENT:     Head: Normocephalic and atraumatic.     Comments: Tenderness with palpation around right orbit including temple region    Right Ear: Tympanic membrane, ear canal and external ear normal. There is no impacted cerumen.     Left Ear: Ear canal and external ear normal. There is impacted  cerumen.     Mouth/Throat:     Mouth: Mucous membranes are moist.     Pharynx: No oropharyngeal exudate or posterior oropharyngeal erythema.  Eyes:     Conjunctiva/sclera: Conjunctivae normal.  Cardiovascular:     Rate and Rhythm: Normal rate and regular rhythm.  Pulmonary:     Effort: Pulmonary effort is normal. No respiratory distress.     Breath sounds: Normal breath sounds. No wheezing or rales.  Musculoskeletal:     Cervical back: Neck supple. No tenderness.  Lymphadenopathy:     Cervical: No cervical adenopathy.  Skin:    General: Skin is warm and dry.     Findings: No rash.  Neurological:     Mental Status: He is alert.            Assessment & Plan:    Acute sinus infection, Intractable headache:: Acute Symptoms started 3 days ago Having significant headache/pressure right orbit region along with some cold symptoms Has had sinus infections in the past that were similar to this Likely acute sinus infection Start prednisone 40 mg daily x 5 days and Augmentin 875-125 mg twice daily x 10 days Other possible diagnosis include temporal arteritis and metastatic disease-both less likely Discussed that if his symptoms are not improving will need to let me know so that we can consider blood work, imaging-him and his wife agree Continue over-the-counter medications for symptom relief Avoid NSAIDs while on prednisone-okay to take Tylenol

## 2023-04-16 ENCOUNTER — Emergency Department (HOSPITAL_BASED_OUTPATIENT_CLINIC_OR_DEPARTMENT_OTHER)
Admission: EM | Admit: 2023-04-16 | Discharge: 2023-04-16 | Discharge status: Against Medical Advice | Payer: 59 | Attending: Emergency Medicine | Admitting: Emergency Medicine

## 2023-04-16 ENCOUNTER — Other Ambulatory Visit (INDEPENDENT_AMBULATORY_CARE_PROVIDER_SITE_OTHER): Payer: 59

## 2023-04-16 ENCOUNTER — Telehealth: Payer: Self-pay | Admitting: Internal Medicine

## 2023-04-16 ENCOUNTER — Ambulatory Visit
Admission: RE | Admit: 2023-04-16 | Discharge: 2023-04-16 | Disposition: A | Discharge status: Home / Self Care | Payer: 59 | Source: Ambulatory Visit | Attending: Internal Medicine | Admitting: Internal Medicine

## 2023-04-16 ENCOUNTER — Encounter (HOSPITAL_BASED_OUTPATIENT_CLINIC_OR_DEPARTMENT_OTHER): Payer: Self-pay

## 2023-04-16 ENCOUNTER — Other Ambulatory Visit: Payer: Self-pay

## 2023-04-16 ENCOUNTER — Encounter: Payer: Self-pay | Admitting: Internal Medicine

## 2023-04-16 DIAGNOSIS — R519 Headache, unspecified: Secondary | ICD-10-CM

## 2023-04-16 DIAGNOSIS — H5711 Ocular pain, right eye: Secondary | ICD-10-CM | POA: Diagnosis present

## 2023-04-16 DIAGNOSIS — Z5321 Procedure and treatment not carried out due to patient leaving prior to being seen by health care provider: Secondary | ICD-10-CM | POA: Diagnosis not present

## 2023-04-16 LAB — COMPREHENSIVE METABOLIC PANEL
ALT: 20 U/L (ref 0–53)
AST: 18 U/L (ref 0–37)
Albumin: 4.3 g/dL (ref 3.5–5.2)
Alkaline Phosphatase: 97 U/L (ref 39–117)
BUN: 19 mg/dL (ref 6–23)
CO2: 28 meq/L (ref 19–32)
Calcium: 9.6 mg/dL (ref 8.4–10.5)
Chloride: 101 meq/L (ref 96–112)
Creatinine, Ser: 1.17 mg/dL (ref 0.40–1.50)
GFR: 66.87 mL/min (ref >60.00)
Glucose, Bld: 200 mg/dL — ABNORMAL HIGH (ref 70–99)
Potassium: 4.2 meq/L (ref 3.5–5.1)
Sodium: 137 meq/L (ref 135–145)
Total Bilirubin: 0.7 mg/dL (ref 0.2–1.2)
Total Protein: 7 g/dL (ref 6.0–8.3)

## 2023-04-16 LAB — CBC WITH DIFFERENTIAL/PLATELET
Basophils Absolute: 0 10*3/uL (ref 0.0–0.1)
Basophils Relative: 0.6 % (ref 0.0–3.0)
Eosinophils Absolute: 0 10*3/uL (ref 0.0–0.7)
Eosinophils Relative: 0 % (ref 0.0–5.0)
HCT: 42.3 % (ref 39.0–52.0)
Hemoglobin: 13.7 g/dL (ref 13.0–17.0)
Lymphocytes Relative: 12.8 % (ref 12.0–46.0)
Lymphs Abs: 1 10*3/uL (ref 0.7–4.0)
MCHC: 32.5 g/dL (ref 30.0–36.0)
MCV: 87.5 fL (ref 78.0–100.0)
Monocytes Absolute: 0.2 10*3/uL (ref 0.1–1.0)
Monocytes Relative: 2.9 % — ABNORMAL LOW (ref 3.0–12.0)
Neutro Abs: 6.6 10*3/uL (ref 1.4–7.7)
Neutrophils Relative %: 83.7 % — ABNORMAL HIGH (ref 43.0–77.0)
Platelets: 289 10*3/uL (ref 150.0–400.0)
RBC: 4.83 Mil/uL (ref 4.22–5.81)
RDW: 14.7 % (ref 11.5–15.5)
WBC: 7.9 10*3/uL (ref 4.0–10.5)

## 2023-04-16 LAB — C-REACTIVE PROTEIN: CRP: <1 mg/dL (ref 0.5–20.0)

## 2023-04-16 LAB — SEDIMENTATION RATE: Sed Rate: 39 mm/h — ABNORMAL HIGH (ref 0–20)

## 2023-04-16 MED ORDER — TETRACAINE HCL 0.5 % OP SOLN: 2.0000 [drp] | Freq: Once | OPHTHALMIC | Status: DC

## 2023-04-16 MED ORDER — FLUORESCEIN SODIUM 1 MG OP STRP: 1.0000 | ORAL_STRIP | Freq: Once | OPHTHALMIC | Status: DC

## 2023-04-16 NOTE — Telephone Encounter (Signed)
Pt wife called stating that the pt is still having pain in his eye and they would like to move forward with further testing please advise.

## 2023-04-16 NOTE — ED Notes (Signed)
Pt told registration they did not want to wait to be seen due to the long wait time. They informed them that they were leaving.

## 2023-04-16 NOTE — Telephone Encounter (Signed)
Wife would like for you to call her:  [phone] - Tiffany

## 2023-04-16 NOTE — ED Triage Notes (Signed)
Pt reports pain behind his right eye that started on Sunday. He was seen at Urgent Care and was told to take Ibuprofen but it has not worked. He then went to see his PCP on Monday and was given prednisone and amoxicillin (for sinus infection). The pain behind his eye has returned today so he went back to his PCP again and had blood work and a CT scan done. The pain persists.

## 2023-04-16 NOTE — Telephone Encounter (Signed)
Spoke with wife today and info given. ?

## 2023-04-16 NOTE — Telephone Encounter (Signed)
It sounds like the pain is better.  It will probably take several days for the infection to improve.  We can go ahead and do further tests.  It is rare that prostate cancer metastasized to the brain which I know is their concern.  He needs to come in and do blood work which I have ordered and I did order a CT scan of the head that can be done most likely today if they can schedule him today.

## 2023-04-17 ENCOUNTER — Other Ambulatory Visit: Payer: Self-pay | Admitting: Ophthalmology

## 2023-04-17 ENCOUNTER — Encounter (HOSPITAL_BASED_OUTPATIENT_CLINIC_OR_DEPARTMENT_OTHER): Payer: Self-pay

## 2023-04-17 ENCOUNTER — Encounter: Payer: Self-pay | Admitting: Adult Health

## 2023-04-17 ENCOUNTER — Other Ambulatory Visit: Payer: Self-pay

## 2023-04-17 ENCOUNTER — Other Ambulatory Visit: Payer: Self-pay | Admitting: Internal Medicine

## 2023-04-17 ENCOUNTER — Emergency Department (HOSPITAL_BASED_OUTPATIENT_CLINIC_OR_DEPARTMENT_OTHER)
Admission: EM | Admit: 2023-04-17 | Discharge: 2023-04-17 | Disposition: A | Discharge status: Home / Self Care | Payer: 59 | Attending: Emergency Medicine | Admitting: Emergency Medicine

## 2023-04-17 DIAGNOSIS — Z8546 Personal history of malignant neoplasm of prostate: Secondary | ICD-10-CM | POA: Diagnosis not present

## 2023-04-17 DIAGNOSIS — E782 Mixed hyperlipidemia: Secondary | ICD-10-CM

## 2023-04-17 DIAGNOSIS — H5711 Ocular pain, right eye: Secondary | ICD-10-CM

## 2023-04-17 DIAGNOSIS — G43009 Migraine without aura, not intractable, without status migrainosus: Secondary | ICD-10-CM

## 2023-04-17 DIAGNOSIS — H0520 Unspecified exophthalmos: Secondary | ICD-10-CM | POA: Insufficient documentation

## 2023-04-17 DIAGNOSIS — H5713 Ocular pain, bilateral: Secondary | ICD-10-CM | POA: Insufficient documentation

## 2023-04-17 MED ORDER — FLUORESCEIN SODIUM 1 MG OP STRP
1.0000 | ORAL_STRIP | Freq: Once | OPHTHALMIC | Status: AC
  Administered 2023-04-17: 1 via OPHTHALMIC
  Filled 2023-04-17: qty 1

## 2023-04-17 MED ORDER — TETRACAINE HCL 0.5 % OP SOLN
1.0000 [drp] | Freq: Once | OPHTHALMIC | Status: AC
  Administered 2023-04-17: 1 [drp] via OPHTHALMIC
  Filled 2023-04-17: qty 4

## 2023-04-17 MED ORDER — OXYCODONE-ACETAMINOPHEN 5-325 MG PO TABS
1.0000 | ORAL_TABLET | Freq: Four times a day (QID) | ORAL | 0 refills | Status: AC | PRN
Start: 2023-04-17 — End: ?

## 2023-04-17 MED ORDER — OXYCODONE-ACETAMINOPHEN 5-325 MG PO TABS
1.0000 | ORAL_TABLET | Freq: Once | ORAL | Status: AC
  Administered 2023-04-17: 1 via ORAL
  Filled 2023-04-17: qty 1

## 2023-04-17 NOTE — ED Provider Notes (Signed)
[facility] AT [facility] Provider Note   CSN: [id number] Arrival date & time: 04/17/23  0003     History  Chief Complaint  Patient presents with   Eye Pain    Gerald Johnson is a 62 y.o. male.  The history is provided by the patient.  Eye Pain  Gerald Johnson is a 62 y.o. male who presents to the Emergency Department complaining of eye pain.  He presents the emergency department for evaluation of right eye pain that started on Sunday.  He initially went to urgent care was treated with 800 mg of ibuprofen, which did not help his pain.  On Monday he went to his PCP and started on prednisone and amoxicillin for possible sinus infection.  He did have mild improvement in symptoms but now they are worsening.  Pain is located to the right eye and periocular region.  No changes in vision, headache.  Pain does spread to his right ear a little bit.  No fevers, nausea, vomiting, numbness, weakness.  He has a history of prostate cancer and is followed by [facility] oncology.  He is on hormone treatment.  He does wear glasses with occasional contact use.  He has not used contacts for a month.  No foreign bodies.  He sees my eye doctor for glasses.     Home Medications Prior to Admission medications   Medication Sig Start Date End Date Taking? Authorizing Provider  oxyCODONE-acetaminophen (PERCOCET/ROXICET) 5-325 MG tablet Take 1 tablet by mouth every 6 (six) hours as needed for severe pain (pain score 7-10). 04/17/23  Yes Tilden Fossa, MD  albuterol (VENTOLIN HFA) 108 (90 Base) MCG/ACT inhaler Inhale 2 puffs into the lungs every 4 (four) hours as needed for wheezing or shortness of breath. 05/01/21   Burchette, Elberta Fortis, MD  allopurinol (ZYLOPRIM) 300 MG tablet Take 300 mg by mouth daily. 05/01/22   [provider]  amLODipine (NORVASC) 5 MG tablet TAKE 1 TABLET(5 MG) BY MOUTH DAILY 01/24/23   Nafziger, Kandee Keen, NP  amoxicillin-clavulanate (AUGMENTIN) 875-125 MG  tablet Take 1 tablet by mouth 2 (two) times daily for 10 days. 04/14/23 04/24/23  Pincus Sanes, MD  Aspirin-Acetaminophen-Caffeine (EXCEDRIN PO) Take 1 tablet by mouth 2 (two) times daily as needed (headache).    [provider]  cetirizine (ZYRTEC) 10 MG tablet Take 10 mg by mouth daily.    [provider]  colchicine 0.6 MG tablet Take by mouth. 03/27/22   [provider]  fluticasone (FLONASE) 50 MCG/ACT nasal spray Place 2 sprays into both nostrils daily. 02/27/23   Nafziger, Kandee Keen, NP  ibuprofen (ADVIL) 400 MG tablet Take 400 mg by mouth every 4 (four) hours as needed. 05/08/22   [provider]  meloxicam (MOBIC) 7.5 MG tablet Take 1 tablet (7.5 mg total) by mouth daily as needed for pain. 07/16/22   Vivi Barrack, DPM  NUBEQA 300 MG tablet Take 600 mg by mouth 2 (two) times daily.    [provider]  predniSONE (DELTASONE) 20 MG tablet Take 2 tablets (40 mg total) by mouth daily with breakfast for 5 days. 04/14/23 04/19/23  Pincus Sanes, MD  rosuvastatin (CRESTOR) 20 MG tablet Take 1 tablet (20 mg total) by mouth daily. 08/16/22   Nafziger, Kandee Keen, NP  sildenafil (VIAGRA) 50 MG tablet Take 1 tablet (50 mg total) by mouth daily as needed for erectile dysfunction (take 1/2 to 1 pill as needed). 01/23/23   Shirline Frees, NP  SUMAtriptan (IMITREX) 50 MG tablet TAKE 1 TABLET BY MOUTH EVERY 2 HOURS AS NEEDED FOR MIGRAINE.MAY REPEAT IN 2 HOURS IF HEADACHE PERSISTS OR RECURS. 06/08/21   Nafziger, Kandee Keen, NP  traZODone (DESYREL) 50 MG tablet Take 1 tablet by mouth at bedtime. 03/27/22 03/27/23  [provider]      Allergies    Patient has no known allergies.    Review of Systems   Review of Systems  Eyes:  Positive for pain.  All other systems reviewed and are negative.   Physical Exam Updated Vital Signs BP (!) 146/100 (BP Location: Right Arm)   Pulse 83   Temp 97.8 F (36.6 C)   Resp 20   Ht 6\' 3"  (1.905 m)   Wt 120.7 kg   SpO2 98%    BMI 33.25 kg/m  Physical Exam Vitals and nursing note reviewed.  Constitutional:      Appearance: He is well-developed.  HENT:     Head: Normocephalic and atraumatic.     Comments: Cerumen in left ear.  No conjunctival injection or erythema.  Pupils equal round and reactive, EOMI.  Pressure in right eye is 35, 40 in left eye.  No uptake on fluorescein staining.  No facial rash.    Right Ear: Tympanic membrane normal.  Eyes:     Conjunctiva/sclera: Conjunctivae normal.  Cardiovascular:     Rate and Rhythm: Normal rate and regular rhythm.  Pulmonary:     Effort: Pulmonary effort is normal. No respiratory distress.  Musculoskeletal:        General: No tenderness.  Skin:    General: Skin is warm and dry.  Neurological:     Mental Status: He is alert and oriented to person, place, and time.     Comments: No asymmetry of facial movements.  5/5 strength in all four extremities  Psychiatric:        Behavior: Behavior normal.     ED Results / Procedures / Treatments   Labs (all labs ordered are listed, but only abnormal results are displayed) Labs Reviewed - No data to display  EKG None  Radiology CT HEAD WO CONTRAST ( )  Result Date: 04/16/2023 CLINICAL DATA:  New onset headache, metastatic prostate cancer, right eye and ear pain, possible sinus infection EXAM: CT HEAD WITHOUT CONTRAST TECHNIQUE: Contiguous axial images were obtained from the base of the skull through the vertex without intravenous contrast. RADIATION DOSE REDUCTION: This exam was performed according to the departmental dose-optimization program which includes automated exposure control, adjustment of the mA and/or kV according to patient size and/or use of iterative reconstruction technique. COMPARISON:  03/15/2010 FINDINGS: Brain: No acute infarct or hemorrhage. Lateral ventricles and midline structures are unremarkable. No acute extra-axial fluid collections. No mass effect. Vascular: No hyperdense vessel or  unexpected calcification. Skull: Normal. Negative for fracture or focal lesion. Sinuses/Orbits: No acute finding. Other: None. IMPRESSION: 1. No acute intracranial process. Electronically Signed   By: Sharlet Salina M.D.   On: 04/16/2023 18:35    Procedures Procedures    Medications Ordered in ED Medications  oxyCODONE-acetaminophen (PERCOCET/ROXICET) 5-325 MG per tablet 1 tablet (has no administration in time range)  fluorescein ophthalmic strip 1 strip (1 strip Both Eyes Given 04/17/23 0047)  tetracaine (PONTOCAINE) 0.5 % ophthalmic solution 1 drop (1 drop Both Eyes Given 04/17/23 0047)    ED Course/ Medical Decision Making/ A&P  Medical Decision Making Risk Prescription drug management.   Patient here for right eye pain since Sunday.  He had outpatient CT scan of his head as well as labs performed.  Labs and symptoms are not consistent with temporal arteritis.  CT scan without acute abnormality.  On examination he does not have any evidence of corneal abrasion, dendrites, shingles, conjunctivitis.  His pressures are elevated bilaterally, examination is not consistent with acute angle-closure glaucoma.  Will treat pain.  Discussed importance of ophthalmology follow-up.        Final Clinical Impression(s) / ED Diagnoses Final diagnoses:  Acute right eye pain    Rx / DC Orders ED Discharge Orders          Ordered    oxyCODONE-acetaminophen (PERCOCET/ROXICET) 5-325 MG tablet  Every 6 hours PRN        11 /21/24 0157              Tilden Fossa, MD 04/17/23 [phone]

## 2023-04-17 NOTE — ED Notes (Signed)
Pt states he started hurting behind his rt. Eye Sunday.  At times go towards his rt. ear

## 2023-04-17 NOTE — ED Triage Notes (Signed)
Pt c/o pain behind right eye, had CT scan and bloodwork done today, here tonight because pain persists despite home medications.

## 2023-04-17 NOTE — Discharge Instructions (Signed)
The cause of your pain was not identified today.  Please follow-up with ophthalmology later today for further evaluation.

## 2023-04-18 ENCOUNTER — Encounter (HOSPITAL_BASED_OUTPATIENT_CLINIC_OR_DEPARTMENT_OTHER): Payer: Self-pay

## 2023-04-18 ENCOUNTER — Other Ambulatory Visit: Payer: Self-pay | Admitting: Ophthalmology

## 2023-04-18 ENCOUNTER — Emergency Department (HOSPITAL_BASED_OUTPATIENT_CLINIC_OR_DEPARTMENT_OTHER)
Admission: EM | Admit: 2023-04-18 | Discharge: 2023-04-19 | Disposition: A | Discharge status: Home / Self Care | Payer: 59 | Attending: Emergency Medicine | Admitting: Emergency Medicine

## 2023-04-18 ENCOUNTER — Ambulatory Visit (INDEPENDENT_AMBULATORY_CARE_PROVIDER_SITE_OTHER): Payer: 59 | Admitting: Adult Health

## 2023-04-18 ENCOUNTER — Telehealth: Payer: Self-pay | Admitting: Adult Health

## 2023-04-18 ENCOUNTER — Other Ambulatory Visit: Payer: Self-pay

## 2023-04-18 ENCOUNTER — Emergency Department (HOSPITAL_BASED_OUTPATIENT_CLINIC_OR_DEPARTMENT_OTHER)
Admission: EM | Admit: 2023-04-18 | Discharge: 2023-04-18 | Discharge status: Absent without leave | Payer: Self-pay | Source: Home / Self Care

## 2023-04-18 VITALS — BP 120/84 | HR 86 | Temp 98.0°F | Ht 75.0 in | Wt 269.0 lb

## 2023-04-18 DIAGNOSIS — H538 Other visual disturbances: Secondary | ICD-10-CM | POA: Insufficient documentation

## 2023-04-18 DIAGNOSIS — Z8546 Personal history of malignant neoplasm of prostate: Secondary | ICD-10-CM | POA: Diagnosis not present

## 2023-04-18 DIAGNOSIS — H5711 Ocular pain, right eye: Secondary | ICD-10-CM | POA: Diagnosis not present

## 2023-04-18 DIAGNOSIS — R519 Headache, unspecified: Secondary | ICD-10-CM | POA: Insufficient documentation

## 2023-04-18 LAB — CBC WITH DIFFERENTIAL/PLATELET
Abs Immature Granulocytes: 0.05 10*3/uL (ref 0.00–0.07)
Basophils Absolute: 0 10*3/uL (ref 0.0–0.1)
Basophils Relative: 1 %
Eosinophils Absolute: 0.2 10*3/uL (ref 0.0–0.5)
Eosinophils Relative: 2 %
HCT: 41.1 % (ref 39.0–52.0)
Hemoglobin: 13.4 g/dL (ref 13.0–17.0)
Immature Granulocytes: 1 %
Lymphocytes Relative: 37 %
Lymphs Abs: 3.2 10*3/uL (ref 0.7–4.0)
MCH: 28.5 pg (ref 26.0–34.0)
MCHC: 32.6 g/dL (ref 30.0–36.0)
MCV: 87.3 fL (ref 80.0–100.0)
Monocytes Absolute: 0.8 10*3/uL (ref 0.1–1.0)
Monocytes Relative: 9 %
Neutro Abs: 4.4 10*3/uL (ref 1.7–7.7)
Neutrophils Relative %: 50 %
Platelets: 270 10*3/uL (ref 150–400)
RBC: 4.71 MIL/uL (ref 4.22–5.81)
RDW: 13.7 % (ref 11.5–15.5)
WBC: 8.7 10*3/uL (ref 4.0–10.5)
nRBC: 0 % (ref 0.0–0.2)

## 2023-04-18 LAB — BASIC METABOLIC PANEL
Anion gap: 7 (ref 5–15)
BUN: 18 mg/dL (ref 8–23)
CO2: 29 mmol/L (ref 22–32)
Calcium: 9.1 mg/dL (ref 8.9–10.3)
Chloride: 102 mmol/L (ref 98–111)
Creatinine, Ser: 1.22 mg/dL (ref 0.61–1.24)
GFR, Estimated: >60 mL/min (ref >60)
Glucose, Bld: 99 mg/dL (ref 70–99)
Potassium: 4 mmol/L (ref 3.5–5.1)
Sodium: 138 mmol/L (ref 135–145)

## 2023-04-18 MED ORDER — DIPHENHYDRAMINE HCL 50 MG/ML IJ SOLN
12.5000 mg | Freq: Once | INTRAMUSCULAR | Status: AC
  Administered 2023-04-18: 12.5 mg via INTRAVENOUS
  Filled 2023-04-18: qty 1

## 2023-04-18 MED ORDER — METOCLOPRAMIDE HCL 5 MG/ML IJ SOLN
5.0000 mg | Freq: Once | INTRAMUSCULAR | Status: AC
  Administered 2023-04-18: 5 mg via INTRAVENOUS
  Filled 2023-04-18: qty 2

## 2023-04-18 MED ORDER — SUMATRIPTAN SUCCINATE 50 MG PO TABS: ORAL_TABLET | ORAL | 6 refills | Status: AC

## 2023-04-18 MED ORDER — GABAPENTIN 300 MG PO CAPS: 300.0000 mg | ORAL_CAPSULE | Freq: Three times a day (TID) | ORAL | 0 refills | Status: AC

## 2023-04-18 MED ORDER — KETOROLAC TROMETHAMINE 30 MG/ML IJ SOLN
15.0000 mg | Freq: Once | INTRAMUSCULAR | Status: AC
  Administered 2023-04-18: 15 mg via INTRAVENOUS
  Filled 2023-04-18: qty 1

## 2023-04-18 MED ORDER — DEXAMETHASONE SODIUM PHOSPHATE 10 MG/ML IJ SOLN
5.0000 mg | Freq: Once | INTRAMUSCULAR | Status: AC
  Administered 2023-04-18: 5 mg via INTRAVENOUS
  Filled 2023-04-18: qty 1

## 2023-04-18 NOTE — Telephone Encounter (Signed)
Pt called to say he is in excruciating pain and is having cluster headaches. Pt states he needs an appointment today. Pt informed NP has no availability. Also, we currently have no availability at this site.  Pt is asking if he can make a nurse appointment to get the shot? Please advise.

## 2023-04-18 NOTE — Patient Instructions (Signed)
It was great seeing you today \  I have sent in the gabapentin and referred you to Neurology. Hopefully Gabapentin helps

## 2023-04-18 NOTE — ED Triage Notes (Signed)
Patient arrives with complaints of persistent headache behind his right eye. Patient was seen here yesterday for the same with no relief from medications prescribed. Patient is schedule for an MRI in December, but would like to have one done today.  Rates pain an 8/10.

## 2023-04-18 NOTE — Progress Notes (Addendum)
Subjective:    Patient ID: Gerald Johnson, male    DOB: [birth year], 62 y.o.   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  He presents to the office today for follow-up regarding right eye pain.    This week he was seen by her primary care provider and was started on prednisone and amoxicillin for possible sinus infection may be causing the right eye pain.  He did have some mild improvement in his symptoms until two days ago when his symptoms became worse and he had to be seen in the emergency room.  Pain was located in the periocular region of his right eye.  He denied any changes of vision, headaches.  Felt as though the pain did spread to his right ear a little bit.  He had not experienced any fevers, nausea, vomiting, numbness, or weakness.  Sed rate and C-reactive protein was drawn at primary care was not concerning for temporal arteritis.  CT done earlier this week showed no acute intracranial process.  Yesterday he was seen by ophthalmology Dr. Zenaida Niece at [facility] eye.  Dr. Zenaida Niece reported that his IOP was within normal range.  No vascular lesion was noted.  Ordered labs and MRI orbit as well as carotid ultrasound.  He was advised to stop amoxicillin and prednisone.  He has Imitrex and he was prescribed gabapentin 300 mg 3 times daily p.o.  Today he is with his girlfriend who helps provide history.  He reports that he continues to have severe pain behind his right eye without any vision loss.  He has tried tripped in, Motrin, and narcotic pain medication without improvement.  His girlfriend has been able to set him up with a neurologist neurology early next week.   His MRI is scheduled for 05/09/2023.  They would like a referral to [facility] neurology as a backup plan.  They also report that the prescription for the gabapentin ophthalmology was going to prescribe was not sent in so they are wondering if I would be willing to send in a short course   Review of Systems See HPI   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   Headache behind the eyes 07/03/2010   New problem noted 27 2012    Hemorrhoids    HYPERLIPIDEMIA 02/26/2007   Hypertension    Migraines    Sciatica 05/04/2008   SINUSITIS, RECURRENT 02/28/2010    Social History   Socioeconomic History   Marital status: Single    Spouse name: Not on file   Number of children: Not on file   Years of education: Not on file   Highest education level: Not on file  Occupational History   Not on file  Tobacco Use   Smoking status: Never   Smokeless tobacco: Never  Vaping Use   Vaping status: Never Used  Substance and Sexual Activity   Alcohol use: Yes    Alcohol/week: 7.0 standard drinks of alcohol    Types: 7 Cans of beer per week    Comment: soically   Drug use: No   Sexual activity: Yes  Birth control/protection: Condom  Other Topics Concern   Not on file  Social History Narrative   Works as a Doctor, hospital.    Not married    No kids    He likes to go to the gym. Likes to go to go to restaurants.    Social Determinants of Health   Financial Resource Strain: Not on file  Food Insecurity: Not on file  Transportation Needs: Not on file  Physical Activity: Not on file  Stress: Not on file  Social Connections: Unknown (10/29/2022)   Received from [facility]   Social Network    Social Network: Not on file  Intimate Partner Violence: Unknown (10/29/2022)   Received from [facility]   HITS    Physically Hurt: Not on file     Insult or Talk Down To: Not on file    Threaten Physical Harm: Not on file    Scream or Curse: Not on file    Past Surgical History:  Procedure Laterality Date   COLONOSCOPY     WISDOM TOOTH EXTRACTION      Family History  Problem Relation Age of Onset   Diabetes Brother    Colon cancer Neg Hx    Esophageal cancer Neg Hx    Rectal cancer Neg Hx    Stomach cancer Neg Hx    Gout Neg Hx    Colon polyps Neg Hx     No Known Allergies  Current Outpatient Medications on File Prior to Visit  Medication Sig Dispense Refill   albuterol (VENTOLIN HFA) 108 (90 Base) MCG/ACT inhaler Inhale 2 puffs into the lungs every 4 (four) hours as needed for wheezing or shortness of breath. 8 g 0   allopurinol (ZYLOPRIM) 300 MG tablet Take 300 mg by mouth daily.     amLODipine (NORVASC) 5 MG tablet TAKE 1 TABLET(5 MG) BY MOUTH DAILY 90 tablet 0   amoxicillin-clavulanate (AUGMENTIN) 875-125 MG tablet Take 1 tablet by mouth 2 (two) times daily for 10 days. 20 tablet 0   Aspirin-Acetaminophen-Caffeine (EXCEDRIN PO) Take 1 tablet by mouth 2 (two) times daily as needed (headache).     cetirizine (ZYRTEC) 10 MG tablet Take 10 mg by mouth daily.     colchicine 0.6 MG tablet Take by mouth.     fluticasone (FLONASE) 50 MCG/ACT nasal spray Place 2 sprays into both nostrils daily. 16 g 6   ibuprofen (ADVIL) 400 MG tablet Take 400 mg by mouth every 4 (four) hours as needed.     meloxicam (MOBIC) 7.5 MG tablet Take 1 tablet (7.5 mg total) by mouth daily as needed for pain. 30 tablet 0   NUBEQA 300 MG tablet Take 600 mg by mouth 2 (two) times daily.     oxyCODONE-acetaminophen (PERCOCET/ROXICET) 5-325 MG tablet Take 1 tablet by mouth every 6 (six) hours as needed for severe pain (pain score 7-10). 4 tablet 0   predniSONE (DELTASONE) 20 MG tablet Take 2 tablets (40 mg total) by mouth daily with breakfast for 5 days. 10 tablet 0   rosuvastatin (CRESTOR) 20 MG tablet Take 1 tablet (20 mg total) by mouth daily. 90  tablet 3   sildenafil (VIAGRA) 50 MG tablet Take 1 tablet (50 mg total) by mouth daily as needed for erectile dysfunction (take 1/2 to 1 pill as needed). 10 tablet 6   SUMAtriptan (IMITREX) 50 MG tablet TAKE 1 TABLET BY MOUTH EVERY 2 HOURS AS NEEDED FOR MIGRAINE.MAY REPEAT IN 2 HOURS IF HEADACHE PERSISTS OR  RECURS. 10 tablet 6   traZODone (DESYREL) 50 MG tablet Take 1 tablet by mouth at bedtime.     No current facility-administered medications on file prior to visit.    BP 120/84   Pulse 86   Temp 98 F (36.7 C) (Oral)   Ht 6\' 3"  (1.905 m)   Wt 269 lb (122 kg)   SpO2 96%   BMI 33.62 kg/m       Objective:   Physical Exam Vitals and nursing note reviewed.  Constitutional:      Appearance: Normal appearance.  Eyes:     General: No scleral icterus.       Right eye: No discharge.        Left eye: No discharge.     Extraocular Movements: Extraocular movements intact.     Conjunctiva/sclera: Conjunctivae normal.     Pupils: Pupils are equal, round, and reactive to light.  Cardiovascular:     Rate and Rhythm: Normal rate and regular rhythm.     Pulses: Normal pulses.     Heart sounds: Normal heart sounds.  Pulmonary:     Effort: Pulmonary effort is normal.     Breath sounds: Normal breath sounds.  Musculoskeletal:        General: Normal range of motion.  Skin:    General: Skin is warm and dry.  Neurological:     General: No focal deficit present.     Mental Status: He is alert and oriented to person, place, and time.     Cranial Nerves: No cranial nerve deficit or facial asymmetry.     Sensory: No sensory deficit.     Motor: No weakness.  Psychiatric:        Mood and Affect: Mood normal.        Behavior: Behavior normal.        Thought Content: Thought content normal.        Judgment: Judgment normal.       Assessment & Plan:  1. Acute right eye pain - Tried high flow oxygen without improvement.  - This is not cluster headache. Does not appear to be migraine or  iritis. Will send in short course of Gabapentin to see if this helps.  - gabapentin (NEURONTIN) 300 MG capsule; Take 1 capsule (300 mg total) by mouth 3 (three) times daily.  Dispense: 90 capsule; Refill: 0 - Will refer to [facility] Neurology per patient request.  - Follow up for MRI and [place]  - Ambulatory referral to Neurology   Shirline Frees, NP  Time spent with patient today was 34 minutes which consisted of chart review, discussing right eye pain,  work up, treatment answering questions and documentation.

## 2023-04-18 NOTE — ED Provider Notes (Signed)
[place] EMERGENCY DEPARTMENT AT [facility] Provider Note   CSN: [id number] Arrival date & time: 04/18/23  1539     History  Chief Complaint  Patient presents with   Headache    Gerald Johnson is a 62 y.o. male who presents emergency department for persistent headache.  Patient symptoms began 5 days ago.  He had sudden onset of severe pain behind his left eye.  He also describes aching and throbbing pain in the V1 and V2 distribution of the trigeminal nerve.  He has associated occasional watering of his eye and has had some blurred vision.  He has seen in the emergency department yesterday and given oxycodone without relief of his symptoms.  He had a head CT done 2 days ago that was negative for acute finding patient was also seen by ophthalmology who recommended outpatient MRI orbits.  He was seen by his primary care doctor and started on both gabapentin and Imitrex.  He has had no improvement in his symptoms.  His wife is adamant that he have an MRI of the brain to rule out metastasis.  He has a history of metastatic prostate cancer.  He has no new neurologic deficits.  Patient denies photophobia, phonophobia, headache, vomiting.  He was not told that he had any papilledema or   Headache      Home Medications Prior to Admission medications   Medication Sig Start Date End Date Taking? Authorizing Provider  albuterol (VENTOLIN HFA) 108 (90 Base) MCG/ACT inhaler Inhale 2 puffs into the lungs every 4 (four) hours as needed for wheezing or shortness of breath. 05/01/21   Burchette, Elberta Fortis, MD  allopurinol (ZYLOPRIM) 300 MG tablet Take 300 mg by mouth daily. 05/01/22   [provider]  amLODipine (NORVASC) 5 MG tablet TAKE 1 TABLET(5 MG) BY MOUTH DAILY 01/24/23   Nafziger, Kandee Keen, NP  amoxicillin-clavulanate (AUGMENTIN) 875-125 MG tablet Take 1 tablet by mouth 2 (two) times daily for 10 days. 04/14/23 04/24/23  Pincus Sanes, MD  Aspirin-Acetaminophen-Caffeine (EXCEDRIN  PO) Take 1 tablet by mouth 2 (two) times daily as needed (headache).    [provider]  cetirizine (ZYRTEC) 10 MG tablet Take 10 mg by mouth daily.    [provider]  colchicine 0.6 MG tablet Take by mouth. 03/27/22   [provider]  fluticasone (FLONASE) 50 MCG/ACT nasal spray Place 2 sprays into both nostrils daily. 02/27/23   Nafziger, Kandee Keen, NP  gabapentin (NEURONTIN) 300 MG capsule Take 1 capsule (300 mg total) by mouth 3 (three) times daily. 04/18/23 05/18/23  Nafziger, Kandee Keen, NP  ibuprofen (ADVIL) 400 MG tablet Take 400 mg by mouth every 4 (four) hours as needed. 05/08/22   [provider]  meloxicam (MOBIC) 7.5 MG tablet Take 1 tablet (7.5 mg total) by mouth daily as needed for pain. 07/16/22   Vivi Barrack, DPM  NUBEQA 300 MG tablet Take 600 mg by mouth 2 (two) times daily.    [provider]  oxyCODONE-acetaminophen (PERCOCET/ROXICET) 5-325 MG tablet Take 1 tablet by mouth every 6 (six) hours as needed for severe pain (pain score 7-10). 04/17/23   Tilden Fossa, MD  predniSONE (DELTASONE) 20 MG tablet Take 2 tablets (40 mg total) by mouth daily with breakfast for 5 days. 04/14/23 04/19/23  Pincus Sanes, MD  rosuvastatin (CRESTOR) 20 MG tablet Take 1 tablet (20 mg total) by mouth daily. 08/16/22   Nafziger, Kandee Keen, NP  sildenafil (VIAGRA) 50 MG tablet Take 1 tablet (  50 mg total) by mouth daily as needed for erectile dysfunction (take 1/2 to 1 pill as needed). 01/23/23   Nafziger, Kandee Keen, NP  SUMAtriptan (IMITREX) 50 MG tablet TAKE 1 TABLET BY MOUTH EVERY 2 HOURS AS NEEDED FOR MIGRAINE.MAY REPEAT IN 2 HOURS IF HEADACHE PERSISTS OR RECURS. 04/18/23   Shirline Frees, NP  traZODone (DESYREL) 50 MG tablet Take 1 tablet by mouth at bedtime. 03/27/22 03/27/23  [provider]      Allergies    Patient has no known allergies.    Review of Systems   Review of Systems  Neurological:  Positive for headaches.    Physical Exam Updated Vital  Signs BP (!) 144/90 (BP Location: Right Arm)   Pulse 83   Temp 97.6 F (36.4 C) (Oral)   Resp 20   Ht 6\' 3"  (1.905 m)   Wt 122 kg   SpO2 100%   BMI 33.62 kg/m  Physical Exam Physical Exam  Constitutional: Pt is oriented to person, place, and time. Pt appears well-developed and well-nourished. No distress.  HENT:  Head: Normocephalic and atraumatic.  Mouth/Throat: Oropharynx is clear and moist.  Eyes: Conjunctivae and EOM are normal. Pupils are equal, round, and reactive to light. No scleral icterus.  No horizontal, vertical or rotational nystagmus  Neck: Normal range of motion. Neck supple.  Full active and passive ROM without pain No midline or paraspinal tenderness No nuchal rigidity or meningeal signs  Cardiovascular: Normal rate, regular rhythm and intact distal pulses.   Pulmonary/Chest: Effort normal and breath sounds normal. No respiratory distress. Pt has no wheezes. No rales.  Abdominal: Soft. Bowel sounds are normal. There is no tenderness. There is no rebound and no guarding.  Musculoskeletal: Normal range of motion.  Lymphadenopathy:    No cervical adenopathy.  Neurological: Pt. is alert and oriented to person, place, and time. He has normal reflexes. No cranial nerve deficit.  Exhibits normal muscle tone. Coordination normal.  Mental Status:  Alert, oriented, thought content appropriate. Speech fluent without evidence of aphasia. Able to follow 2 step commands without difficulty.  Cranial Nerves:  II:  Peripheral visual fields grossly normal, pupils equal, round, reactive to light III,IV, VI: ptosis not present, extra-ocular motions intact bilaterally  V,VII: smile symmetric, facial light touch sensation equal VIII: hearing grossly normal bilaterally  IX,X: midline uvula rise  XI: bilateral shoulder shrug equal and strong XII: midline tongue extension  Motor:  5/5 in upper and lower extremities bilaterally including strong and equal grip strength and  dorsiflexion/plantar flexion Sensory: Pinprick and light touch normal in all extremities.  Deep Tendon Reflexes: 2+ and symmetric  Cerebellar: normal finger-to-nose with bilateral upper extremities Gait: normal gait and balance CV: distal pulses palpable throughout   Skin: Skin is warm and dry. No rash noted. Pt is not diaphoretic.  Psychiatric: Pt has a normal mood and affect. Behavior is normal. Judgment and thought content normal.  Nursing note and vitals reviewed.  ED Results / Procedures / Treatments   Labs (all labs ordered are listed, but only abnormal results are displayed) Labs Reviewed - No data to display  EKG None  Radiology No results found.  Procedures Procedures    Medications Ordered in ED Medications - No data to display  ED Course/ Medical Decision Making/ A&P                                 Medical Decision  Making Amount and/or Complexity of Data Reviewed Labs: ordered. Radiology: ordered.  Risk Prescription drug management.  Patient here with complaint of headache behind the eye, history of metastatic prostate cancer.  Will transfer to [facility] for MRI brain to rule out metastatic brain mass.  Treated here in the emergency department with migraine cocktail.  Will transfer by POV with wife.  Patient safe for transfer.        Final Clinical Impression(s) / ED Diagnoses Final diagnoses:  None    Rx / DC Orders ED Discharge Orders     None         Arthor Captain, PA-C 04/18/23 2057    Virgina Norfolk, DO 04/18/23 2239

## 2023-04-18 NOTE — Telephone Encounter (Signed)
Please disregard previous message. NP had a 1 pm cancellation and I offered it to Pt. He will be here this afternoon.

## 2023-04-19 ENCOUNTER — Emergency Department (HOSPITAL_COMMUNITY): Payer: 59

## 2023-04-19 MED ORDER — BUTALBITAL-APAP-CAFFEINE 50-325-40 MG PO TABS
1.0000 | ORAL_TABLET | Freq: Once | ORAL | Status: AC
  Administered 2023-04-19: 1 via ORAL
  Filled 2023-04-19: qty 1

## 2023-04-19 MED ORDER — METOCLOPRAMIDE HCL 10 MG PO TABS: 10.0000 mg | ORAL_TABLET | Freq: Four times a day (QID) | ORAL | 0 refills | Status: AC

## 2023-04-19 MED ORDER — METOCLOPRAMIDE HCL 5 MG/ML IJ SOLN
5.0000 mg | Freq: Once | INTRAMUSCULAR | Status: AC
  Administered 2023-04-19: 5 mg via INTRAVENOUS
  Filled 2023-04-19: qty 2

## 2023-04-19 MED ORDER — KETOROLAC TROMETHAMINE 30 MG/ML IJ SOLN
15.0000 mg | Freq: Once | INTRAMUSCULAR | Status: AC
  Administered 2023-04-19: 15 mg via INTRAVENOUS
  Filled 2023-04-19: qty 1

## 2023-04-19 MED ORDER — GADOBUTROL 1 MMOL/ML IV SOLN
10.0000 mL | Freq: Once | INTRAVENOUS | Status: AC | PRN
  Administered 2023-04-19: 10 mL via INTRAVENOUS

## 2023-04-19 NOTE — Discharge Instructions (Addendum)
Your MRI imaging was negative.  Your primary care provider prescribed Gabapentin. Take this as prescribed to try and assist with ongoing pain relief. We have provided an additional prescription of Reglan to use as needed. Continue over-the-counter Excedrin migraine or Aleve for pain relief, if no other medications help. Your primary care doctor has completed a referral to [facility]. Return for new or concerning symptoms.

## 2023-04-19 NOTE — ED Provider Notes (Signed)
12:09 AM Patient care assumed in transfer from [facility] for evaluation of R ocular headache with associated pain to the lateral periorbit. MRI brain and orbits w/w/o contrast ordered. Tech should be by to transport patient to imaging momentarily.  Pain presently 4/10, down from 8/10. Patient declines additional medications at this time.  1:21 AM MRI completed; no obvious abnormalities noted on my visualization of images. Pending formal radiology interpretation.  Prior outpatient ESR on 04/16/23 resulted at 39; low suspicion for temporal arteritis.   2:40 AM MRI imaging negative for acute process. Pain rated 3/10. Discussed supportive care measures. Was given Gabapentin by his PCP to use, which I think is reasonable to continue. Reports no improvement with Sumatriptan previously. Reglan Rx added for PRN use. Given additional neurology referral to practice in [place], at the request of patient's spouse. Return precautions discussed and provided. Patient discharged in stable condition with no unaddressed concerns.   MR Brain W and Wo Contrast  Result Date: 04/19/2023 CLINICAL DATA:  Vision impairment EXAM: MRI HEAD AND ORBITS WITHOUT AND WITH CONTRAST TECHNIQUE: Multiplanar, multiecho pulse sequences of the brain and surrounding structures were obtained without and with intravenous contrast. Multiplanar, multiecho pulse sequences of the orbits and surrounding structures were obtained including fat saturation techniques, before and after intravenous contrast administration. CONTRAST:  10mL GADAVIST GADOBUTROL 1 MMOL/ML IV SOLN COMPARISON:  None Available. FINDINGS: MRI HEAD FINDINGS Brain: No restricted diffusion to suggest acute or subacute infarct. No abnormal parenchymal or meningeal enhancement. No acute hemorrhage, mass, mass effect, or midline shift. No hydrocephalus or extra-axial collection. Pituitary and craniocervical junction are within normal limits. No hemosiderin deposition to suggest remote  hemorrhage. Scattered T2 hyperintense signal in the periventricular white matter, likely the sequela of mild chronic small vessel ischemic disease. Vascular: Normal arterial flow voids. Normal arterial and venous enhancement. Skull and upper cervical spine: Normal marrow signal. Other: Fluid in the right mastoid air cells. MRI ORBITS FINDINGS Orbits: No traumatic or inflammatory finding. Globes, orbital fat, extraocular muscles, vascular structures, and lacrimal glands are normal. Somewhat tortuous optic nerves, which are otherwise unremarkable. No abnormal enhancement. Visualized sinuses: Right maxillary mucous retention cyst. Soft tissues: No acute finding. IMPRESSION: 1. No acute intracranial process. No evidence of acute or subacute infarct. 2. No acute process in the orbits. Electronically Signed   By: Wiliam Ke M.D.   On: 04/19/2023 02:10   MR ORBITS W WO CONTRAST  Result Date: 04/19/2023 CLINICAL DATA:  Vision impairment EXAM: MRI HEAD AND ORBITS WITHOUT AND WITH CONTRAST TECHNIQUE: Multiplanar, multiecho pulse sequences of the brain and surrounding structures were obtained without and with intravenous contrast. Multiplanar, multiecho pulse sequences of the orbits and surrounding structures were obtained including fat saturation techniques, before and after intravenous contrast administration. CONTRAST:  10mL GADAVIST GADOBUTROL 1 MMOL/ML IV SOLN COMPARISON:  None Available. FINDINGS: MRI HEAD FINDINGS Brain: No restricted diffusion to suggest acute or subacute infarct. No abnormal parenchymal or meningeal enhancement. No acute hemorrhage, mass, mass effect, or midline shift. No hydrocephalus or extra-axial collection. Pituitary and craniocervical junction are within normal limits. No hemosiderin deposition to suggest remote hemorrhage. Scattered T2 hyperintense signal in the periventricular white matter, likely the sequela of mild chronic small vessel ischemic disease. Vascular: Normal arterial flow  voids. Normal arterial and venous enhancement. Skull and upper cervical spine: Normal marrow signal. Other: Fluid in the right mastoid air cells. MRI ORBITS FINDINGS Orbits: No traumatic or inflammatory finding. Globes, orbital fat, extraocular muscles, vascular structures, and lacrimal  glands are normal. Somewhat tortuous optic nerves, which are otherwise unremarkable. No abnormal enhancement. Visualized sinuses: Right maxillary mucous retention cyst. Soft tissues: No acute finding. IMPRESSION: 1. No acute intracranial process. No evidence of acute or subacute infarct. 2. No acute process in the orbits. Electronically Signed   By: Wiliam Ke M.D.   On: 04/19/2023 02:10   CT HEAD WO CONTRAST ( )  Result Date: 04/16/2023 CLINICAL DATA:  New onset headache, metastatic prostate cancer, right eye and ear pain, possible sinus infection EXAM: CT HEAD WITHOUT CONTRAST TECHNIQUE: Contiguous axial images were obtained from the base of the skull through the vertex without intravenous contrast. RADIATION DOSE REDUCTION: This exam was performed according to the departmental dose-optimization program which includes automated exposure control, adjustment of the mA and/or kV according to patient size and/or use of iterative reconstruction technique. COMPARISON:  03/15/2010 FINDINGS: Brain: No acute infarct or hemorrhage. Lateral ventricles and midline structures are unremarkable. No acute extra-axial fluid collections. No mass effect. Vascular: No hyperdense vessel or unexpected calcification. Skull: Normal. Negative for fracture or focal lesion. Sinuses/Orbits: No acute finding. Other: None. IMPRESSION: 1. No acute intracranial process. Electronically Signed   By: Sharlet Salina M.D.   On: 04/16/2023 18:35      Antony Madura, PA-C 04/19/23 0305    Nira Conn, MD 04/19/23 [phone]

## 2023-04-21 ENCOUNTER — Encounter: Payer: Self-pay | Admitting: Neurology

## 2023-04-28 ENCOUNTER — Inpatient Hospital Stay: Admission: RE | Admit: 2023-04-28 | Payer: 59 | Source: Ambulatory Visit

## 2023-04-29 ENCOUNTER — Emergency Department (HOSPITAL_COMMUNITY)
Admission: EM | Admit: 2023-04-29 | Discharge: 2023-04-29 | Disposition: A | Discharge status: Home / Self Care | Payer: 59 | Attending: Emergency Medicine | Admitting: Emergency Medicine

## 2023-04-29 ENCOUNTER — Emergency Department (HOSPITAL_COMMUNITY): Payer: 59

## 2023-04-29 ENCOUNTER — Encounter (HOSPITAL_COMMUNITY): Payer: Self-pay

## 2023-04-29 DIAGNOSIS — G8929 Other chronic pain: Secondary | ICD-10-CM | POA: Diagnosis not present

## 2023-04-29 DIAGNOSIS — R519 Headache, unspecified: Secondary | ICD-10-CM | POA: Insufficient documentation

## 2023-04-29 DIAGNOSIS — I10 Essential (primary) hypertension: Secondary | ICD-10-CM | POA: Diagnosis not present

## 2023-04-29 LAB — C-REACTIVE PROTEIN: CRP: 5.8 mg/dL — ABNORMAL HIGH (ref <1.0)

## 2023-04-29 LAB — SEDIMENTATION RATE: Sed Rate: 40 mm/h — ABNORMAL HIGH (ref 0–16)

## 2023-04-29 MED ORDER — IBUPROFEN 800 MG PO TABS: 800.0000 mg | ORAL_TABLET | Freq: Three times a day (TID) | ORAL | 0 refills | Status: DC

## 2023-04-29 MED ORDER — IOHEXOL 350 MG/ML SOLN
75.0000 mL | Freq: Once | INTRAVENOUS | Status: AC | PRN
  Administered 2023-04-29: 75 mL via INTRAVENOUS

## 2023-04-29 MED ORDER — DEXAMETHASONE SODIUM PHOSPHATE 10 MG/ML IJ SOLN
10.0000 mg | Freq: Once | INTRAMUSCULAR | Status: AC
  Administered 2023-04-29: 10 mg via INTRAVENOUS
  Filled 2023-04-29: qty 1

## 2023-04-29 MED ORDER — METOCLOPRAMIDE HCL 5 MG/ML IJ SOLN
10.0000 mg | Freq: Once | INTRAMUSCULAR | Status: AC
  Administered 2023-04-29: 10 mg via INTRAVENOUS
  Filled 2023-04-29: qty 2

## 2023-04-29 MED ORDER — SODIUM CHLORIDE (PF) 0.9 % IJ SOLN
INTRAMUSCULAR | Status: AC
  Filled 2023-04-29: qty 50

## 2023-04-29 MED ORDER — KETOROLAC TROMETHAMINE 15 MG/ML IJ SOLN
15.0000 mg | Freq: Once | INTRAMUSCULAR | Status: AC
  Administered 2023-04-29: 15 mg via INTRAVENOUS
  Filled 2023-04-29: qty 1

## 2023-04-29 MED ORDER — SODIUM CHLORIDE 0.9 % IV SOLN: 10.0000 mg | Freq: Once | INTRAVENOUS | Status: DC

## 2023-04-29 MED ORDER — DIPHENHYDRAMINE HCL 50 MG/ML IJ SOLN
12.5000 mg | Freq: Once | INTRAMUSCULAR | Status: AC
  Administered 2023-04-29: 12.5 mg via INTRAVENOUS
  Filled 2023-04-29: qty 1

## 2023-04-29 NOTE — Progress Notes (Addendum)
These are curbside recommendations based upon the information readily available in the chart on brief review as well as history and examination information provided to me by requesting provider and do not replace a full detailed consult     Discussed briefly with Dr. Lynelle Doctor this is a 62 year old gentleman presenting for headache with frequent presentations for the same since 11/18.  Initially attributed to sinus headache at that time ESR mildly elevated at 39 and imaging concerning for possible sinus infection.  Last BMP and CBC 04/18/2023 were reassuring   He has been of evaluated by ophthalmology, MRI brain and MRI orbits reassuring on 11/23.  Reportedly symptoms did improve initially after 11/23 but now have recurred.  As discussed no clear clustering, more of a constant headache, no other focal neurological symptoms and benign neurological examination per Dr. Lynelle Doctor   In terms of ruling out other dangerous etiologies of headache, CTA could be obtained to rule out aneurysm and CT venogram be obtained to rule out venous thrombosis.  Could repeat ESR/CRP to confirm that these have improved   If the studies are reassuring, continued outpatient follow-up noting that his appointment is in March given limited availability of outpatient neurology   Patient may also be discharged on oral migraine cocktail (25-50 mg Benadryl, 800 mg ibuprofen, 750 mg Tylenol, taken altogether with large amounts of water every 6 hours until headache is controlled, not to exceed 3 days nor to exceed 12 doses a week to avoid development of medication overuse headache)   Patient should also be counseled not to exceed 9 doses of sumatriptan per month again to avoid development of medication overuse headache.  If he has not had more than 1-2 doses of sumatriptan in the past month, could do a sumatriptan burst of 1 tablet every 12 hours along with the migraine cocktail as described above, again not to exceed a total of 4.5 days (9  doses total)    Addendum:  CTA CTV reassuring agree with radiology report, reviewed ESR stable at 40 , given per EDP no other GCA symptoms reported (temporal artery tenderness/pulseless, jaw claudication, vision loss, or diplopia, fevers, myalgias, proximal muscle weakness etc) low concern for GCA and continued outpatient follow would be appropriate  Brooke Dare MD-PhD [facility] [phone] Available 7 AM to 7 PM, outside these hours please contact Neurologist on call listed on AMION    20 minutes spent in care of this patient, the majority in direct consultation with Dr. Lynelle Doctor, with family requesting this tele consultation

## 2023-04-29 NOTE — ED Triage Notes (Signed)
Patient arrived with R eye pain. Has been seen recently for the same. Received headache cocktail and transferred  here for MRI a few weeks ago. MRI was clear. More medication was given at tha time. D/C at that time with Gabapentin and PRN Reglan. Since then he made a Opthalmologic and Neuro appointment, saw MD and gave him a prescription injection to take for cluster headache. Denies blurred vision, vomiting etc. Gerald Johnson a week without a headache then woke up Monday morning with a headache, pain radiating down right side of his face. No other Neuro symptoms.

## 2023-04-29 NOTE — ED Provider Notes (Signed)
[place] EMERGENCY DEPARTMENT AT [facility] Provider Note   CSN: [id number] Arrival date & time: 04/29/23  8657     History  Chief Complaint  Patient presents with   Eye Pain    Gerald Johnson is a 62 y.o. male.   Eye Pain     Patient presents to the ED for evaluation of a headache.  Patient has history of hyperlipidemia sciatica migraines hypertension prostatitis.  Patient recently has been having issues with headaches behind his right eye.  Patient denies any jaw pain.  No muscle weakness.  Patient has recently been seen for this and has had MRIs as well as evaluations by neurology and ophthalmology. On November 22 patient was in the emergency room for headache.  End up getting transferred from a freestanding ED to [facility].  Patient had an MRI of that time.  Patient also had sed rate as an outpatient that did not show any significant elevation.  Patient states he continues to have headache behind his right eye.  He is not having any numbness or weakness.  No visual changes.  Patient's MRI on November 23 included MRIs of his orbits with and without contrast and MRIs of his brain.  No acute process was noted on either study.  Patient states he was doing fine for about a week and then on Monday he woke up with headache again.  Headaches primarily around his right eye and right face. Patient states the last headache cocktail he received was helpful.  He requests the same  Home Medications Prior to Admission medications   Medication Sig Start Date End Date Taking? Authorizing Provider  albuterol (VENTOLIN HFA) 108 (90 Base) MCG/ACT inhaler Inhale 2 puffs into the lungs every 4 (four) hours as needed for wheezing or shortness of breath. 05/01/21   Burchette, Elberta Fortis, MD  allopurinol (ZYLOPRIM) 300 MG tablet Take 300 mg by mouth daily. 05/01/22   [provider]  amLODipine (NORVASC) 5 MG tablet TAKE 1 TABLET(5 MG) BY MOUTH DAILY 01/24/23   Nafziger, Kandee Keen,  NP  Aspirin-Acetaminophen-Caffeine (EXCEDRIN PO) Take 1 tablet by mouth 2 (two) times daily as needed (headache).    [provider]  cetirizine (ZYRTEC) 10 MG tablet Take 10 mg by mouth as needed for allergies.    [provider]  colchicine 0.6 MG tablet Take by mouth. 03/27/22   [provider]  fluticasone (FLONASE) 50 MCG/ACT nasal spray Place 2 sprays into both nostrils daily. 02/27/23   Nafziger, Kandee Keen, NP  gabapentin (NEURONTIN) 300 MG capsule Take 1 capsule (300 mg total) by mouth 3 (three) times daily. 04/18/23 05/18/23  Nafziger, Kandee Keen, NP  ibuprofen (ADVIL) 400 MG tablet Take 400 mg by mouth every 4 (four) hours as needed. 05/08/22   [provider]  meloxicam (MOBIC) 7.5 MG tablet Take 1 tablet (7.5 mg total) by mouth daily as needed for pain. 07/16/22   Vivi Barrack, DPM  metoCLOPramide (REGLAN) 10 MG tablet Take 1 tablet (10 mg total) by mouth every 6 (six) hours. For nausea, headache. 04/19/23   Antony Madura, PA-C  NUBEQA 300 MG tablet Take 600 mg by mouth 2 (two) times daily.    [provider]  oxyCODONE-acetaminophen (PERCOCET/ROXICET) 5-325 MG tablet Take 1 tablet by mouth every 6 (six) hours as needed for severe pain (pain score 7-10). 04/17/23   Tilden Fossa, MD  rosuvastatin (CRESTOR) 20 MG tablet Take 1 tablet (20 mg total) by mouth daily. 08/16/22  Nafziger, Kandee Keen, NP  sildenafil (VIAGRA) 50 MG tablet Take 1 tablet (50 mg total) by mouth daily as needed for erectile dysfunction (take 1/2 to 1 pill as needed). 01/23/23   Nafziger, Kandee Keen, NP  SUMAtriptan (IMITREX) 50 MG tablet TAKE 1 TABLET BY MOUTH EVERY 2 HOURS AS NEEDED FOR MIGRAINE.MAY REPEAT IN 2 HOURS IF HEADACHE PERSISTS OR RECURS. 04/18/23   Shirline Frees, NP  traZODone (DESYREL) 50 MG tablet Take 1 tablet by mouth at bedtime. 03/27/22 03/27/23  [provider]      Allergies    Patient has no known allergies.    Review of Systems   Review of Systems  Eyes:   Positive for pain.    Physical Exam Updated Vital Signs BP (!) 138/93   Pulse 75   Temp 98.1 F (36.7 C)   Resp 10   Ht 1.905 m (6\' 3" )   Wt 122 kg   SpO2 95%   BMI 33.62 kg/m  Physical Exam Vitals and nursing note reviewed.  Constitutional:      General: He is not in acute distress.    Appearance: He is well-developed.  HENT:     Head: Normocephalic and atraumatic.     Right Ear: External ear normal.     Left Ear: External ear normal.  Eyes:     General: No scleral icterus.       Right eye: No discharge.        Left eye: No discharge.     Conjunctiva/sclera: Conjunctivae normal.  Neck:     Trachea: No tracheal deviation.  Cardiovascular:     Rate and Rhythm: Normal rate and regular rhythm.  Pulmonary:     Effort: Pulmonary effort is normal. No respiratory distress.     Breath sounds: Normal breath sounds. No stridor. No wheezing or rales.  Abdominal:     General: Bowel sounds are normal. There is no distension.     Palpations: Abdomen is soft.     Tenderness: There is no abdominal tenderness. There is no guarding or rebound.  Musculoskeletal:        General: No tenderness or deformity.     Cervical back: Neck supple.  Skin:    General: Skin is warm and dry.     Findings: No rash.  Neurological:     General: No focal deficit present.     Mental Status: He is alert.     Cranial Nerves: No cranial nerve deficit, dysarthria or facial asymmetry.     Sensory: No sensory deficit.     Motor: No weakness, abnormal muscle tone or seizure activity.     Coordination: Coordination normal.     Comments: Normal strength sensation bilateral upper extremities lower extremities  Psychiatric:        Mood and Affect: Mood normal.     ED Results / Procedures / Treatments   Labs (all labs ordered are listed, but only abnormal results are displayed) Labs Reviewed  SEDIMENTATION RATE - Abnormal; Notable for the following components:      Result Value   Sed Rate 40 (*)     All other components within normal limits  C-REACTIVE PROTEIN    EKG None  Radiology CT VENOGRAM HEAD  Result Date: 04/29/2023 CLINICAL DATA:  Dural venous sinus thrombosis suspected.  Headache. EXAM: CT VENOGRAM HEAD TECHNIQUE: Venographic phase images of the brain were obtained following the administration of intravenous contrast. Multiplanar reformats and maximum intensity projections were generated. RADIATION DOSE REDUCTION: This  exam was performed according to the departmental dose-optimization program which includes automated exposure control, adjustment of the mA and/or kV according to patient size and/or use of iterative reconstruction technique. CONTRAST:  75mL OMNIPAQUE IOHEXOL 350 MG/ML SOLN COMPARISON:  MRI head 04/19/2023 FINDINGS: The superior sagittal sinus, internal cerebral veins, vein of Galen, straight sinus, transverse sinuses, sigmoid sinuses, and jugular bulbs are patent without evidence of thrombus or significant stenosis. IMPRESSION: Negative CT venogram. Electronically Signed   By: Sebastian Ache M.D.   On: 04/29/2023 11:35   CT Angio Head W or Wo Contrast  Result Date: 04/29/2023 CLINICAL DATA:  Headache, sudden, severe. EXAM: CT ANGIOGRAPHY HEAD TECHNIQUE: Multidetector CT imaging of the head was performed using the standard protocol during bolus administration of intravenous contrast. Multiplanar CT image reconstructions and MIPs were obtained to evaluate the vascular anatomy. RADIATION DOSE REDUCTION: This exam was performed according to the departmental dose-optimization program which includes automated exposure control, adjustment of the mA and/or kV according to patient size and/or use of iterative reconstruction technique. CONTRAST:  75mL OMNIPAQUE IOHEXOL 350 MG/ML SOLN COMPARISON:  Head CT 04/16/2023 and MRI 04/19/2023 FINDINGS: CT HEAD Brain: There is no evidence of an acute infarct, intracranial hemorrhage, mass, midline shift, or extra-axial fluid collection. The  ventricles and sulci are normal. Vascular: No hyperdense vessel. Skull: No acute fracture or suspicious osseous lesion. Sinuses: Right maxillary sinus mucous retention cyst. Clear mastoid air cells. Other: Unremarkable orbits. CTA HEAD Anterior circulation: The internal carotid arteries are patent from skull base to carotid termini with mild atherosclerosis bilaterally not resulting in a significant stenosis. ACAs and MCAs are patent without evidence of a proximal branch occlusion or significant proximal stenosis. A 3 mm aneurysm projects medially from the left MCA bifurcation (series 13, image 86). Posterior circulation: The included portions of the distal vertebral arteries are patent to the basilar with the right being dominant. Patent PICA and SCA origins are visualized bilaterally. The basilar artery is widely patent. There are large posterior communicating arteries and hypoplastic P1 segments bilaterally. Both PCAs are patent without evidence of a significant proximal stenosis. No aneurysm is identified. Venous sinuses: More fully evaluated on the separate dedicated venogram. Anatomic variants: Predominantly fetal origin of the PCAs. Review of the MIP images confirms the above findings. IMPRESSION: 1. 3 mm left MCA bifurcation aneurysm. 2. Mild intracranial atherosclerosis without a large vessel occlusion or significant proximal stenosis. 3. No evidence of acute intracranial abnormality. Electronically Signed   By: Sebastian Ache M.D.   On: 04/29/2023 11:32    Procedures Procedures    Medications Ordered in ED Medications  ketorolac (TORADOL) 15 MG/ML injection 15 mg (15 mg Intravenous Given 04/29/23 1014)  diphenhydrAMINE (BENADRYL) injection 12.5 mg (12.5 mg Intravenous Given 04/29/23 1015)  metoCLOPramide (REGLAN) injection 10 mg (10 mg Intravenous Given 04/29/23 1016)  dexamethasone (DECADRON) injection 10 mg (10 mg Intravenous Given 04/29/23 1011)  iohexol (OMNIPAQUE) 350 MG/ML injection 75 mL (75  mLs Intravenous Contrast Given 04/29/23 1028)    ED Course/ Medical Decision Making/ A&P Clinical Course as of 04/29/23 1324  Tue Apr 29, 2023  0941 Reviewed case with Dr Iver Nestle.  Would be reasonable to assess CT venogram and angiogram to rule out aneurysm, venous thrombosis since there is a long delay before his outpatient appointment occurs.  Repeating the esr is reasonable. [JK]  1231 Rate mildly elevated at 40, unchanged compared to previous [JK]  1232 CT Angio Head W or Wo Contrast [JK]  1232 Venogram  negative. [JK]  1232 CT angiogram shows 3 mm bifurcation aneurysm unrelated to patient's symptoms.  Mild atherosclerosis noted without evidence of occlusion [JK]  1321 Imaging reviewed by Dr. Iver Nestle overall reassuring. [JK]  1321 HA Has improved.  Patient is feeling better [JK]    Clinical Course User Index [JK] Linwood Dibbles, MD                                 Medical Decision Making Amount and/or Complexity of Data Reviewed Labs: ordered. Radiology: ordered and independent interpretation performed. Decision-making details documented in ED Course.  Risk Prescription drug management.  Patient presented ED for evaluation of persistent headache.  Patient has been seen by ophthalmology.  Has had ED visits with MRI testing.  Patient's headaches persist.  He has been waiting for a referral to neurology but will not be able to see them for several months.  Patient did have an MRV as well as CT angiogram today.  There is no acute abnormality.  Incidental 3 mm aneurysm noted but this would not account for his symptoms.  Patient is not having any symptoms to suggest subarachnoid hemorrhage or aneurysm rupture.  Patient does have a slight elevation in his sed rate.  Unchanged from previous.  Patient does not have any symptoms of jaw claudication.  He does not have any tenderness at the temporal artery.  Patient does have history of gout and I suspect this can be the cause of his elevation of sed  rate and not related to this headache.  Will place referral to neurology.        Final Clinical Impression(s) / ED Diagnoses Final diagnoses:  Nonintractable episodic headache, unspecified headache type    Rx / DC Orders ED Discharge Orders          Ordered    Ambulatory referral to Neurology       Comments: An appointment is requested in approximately: 2 weeks   04/29/23 1324              Linwood Dibbles, MD 04/29/23 1324

## 2023-04-29 NOTE — Discharge Instructions (Signed)
Follow-up with the neurologist for further evaluation.  As we discussed an incidental aneurysm was noted unrelated to your symptoms.  Typically repeat images recommended in 3 to 6 months initially to ensure stability.

## 2023-04-29 NOTE — Plan of Care (Signed)
  These are curbside recommendations based upon the information readily available in the chart on brief review as well as history and examination information provided to me by requesting provider and do not replace a full detailed consult   Discussed briefly with Dr. Lynelle Doctor this is a 62 year old gentleman presenting for headache with frequent presentations for the same since 11/18.  Initially attributed to sinus headache at that time ESR mildly elevated at 39 and imaging concerning for possible sinus infection.  Last BMP and CBC 04/18/2023 were reassuring  He has been of evaluated by ophthalmology, MRI brain and MRI orbits reassuring on 11/23.  Reportedly symptoms did improve initially after 11/23 but now have recurred.  As discussed no clear clustering, more of a constant headache, no other focal neurological symptoms and benign neurological examination per Dr. Lynelle Doctor  In terms of ruling out other dangerous etiologies of headache, CTA could be obtained to rule out aneurysm and CT venogram be obtained to rule out venous thrombosis.  Could repeat ESR/CRP to confirm that these have improved  If the studies are reassuring, continued outpatient follow-up noting that his appointment is in March given limited availability of outpatient neurology  Patient may also be discharged on oral migraine cocktail (25-50 mg Benadryl, 800 mg ibuprofen, 750 mg Tylenol, taken altogether with large amounts of water every 6 hours until headache is controlled, not to exceed 3 days nor to exceed 12 doses a week to avoid development of medication overuse headache)  Patient should also be counseled not to exceed 9 doses of sumatriptan per month again to avoid development of medication overuse headache.  If he has not had more than 1-2 doses of sumatriptan in the past month, could do a sumatriptan burst of 1 tablet every 12 hours along with the migraine cocktail as described above, again not to exceed a total of 4.5 days (9 doses  total)  Brooke Dare MD-PhD [facility] [phone] Available 7 AM to 7 PM, outside these hours please contact Neurologist on call listed on AMION   15 minutes spent in care of this patient, the majority in direct consultation with Dr. Lynelle Doctor, with family requesting this tele consultation

## 2023-04-30 ENCOUNTER — Other Ambulatory Visit: Payer: Self-pay | Admitting: Adult Health

## 2023-04-30 DIAGNOSIS — I10 Essential (primary) hypertension: Secondary | ICD-10-CM

## 2023-05-08 ENCOUNTER — Other Ambulatory Visit: Payer: 59

## 2023-05-09 ENCOUNTER — Other Ambulatory Visit: Payer: 59

## 2023-05-22 NOTE — Progress Notes (Deleted)
NEUROLOGY CONSULTATION NOTE  Gerald Johnson MRN: [id number] DOB: 1960-09-14  Referring provider: Antony Madura, PA-C (ED referral) Primary care provider: Shirline Frees, NP  Reason for consult:  migraines  Assessment/Plan:   ***   Subjective:  Gerald Johnson is a 62 year old ***-handed male with HTN, HLD, and prostate cancer with metastasis to the bone who presents for migraines.  History supplemented by ED, ophthalmology and primary care notes.  He reports that he was diagnosed with cluster headaches ***, described as periorbital pain involving either eye, lasting a day and would occur every 2 months.  Treated with amitriptyline 50mg  as needed.  ***.  In November, he began experiencing what he endorses as new right-sided eye pain.  His PCP started him on prednisone and amoxicillin for presumed sinus infection which were ineffective.  Did not respond to ibuprofen, Percocet or tetracaine.  Pain described as *** pressure-like pain in the right periorbital and retro-orbital region with associated right temporal tenderness and right lacrimation but denies nausea, vomiting, photophobia, phonophobia, conjunctival injection, ptosis, jaw pain or neck pain.  Pain typically lasts ***.  Seen in the ED on 11/21 where CT head was unremarkable.  Sed rate and CRP were 39 and negative respectively .  He was found to have increased IOP in both eyes, higher on the left.  He saw ophthalmology that day, Dr. Zenaida Niece, who didn't have a definitive diagnosis and ordered further testing.  He returned to the ED on two other occasions.  MRI of brain and orbits with and without contrast on 11/23 revealed mild chronic small vessel ischemic changes in the cerebral white matter but otherwise were negative.  On 12/3, CTV of head was negative for thrombosis or significant stenosis and CTA of head and neck revealed 3 mm left MCA bifurcation aneurysm and mild intracranial atherosclerosis but otherwise unremarkable.  Repeat sed rate was  40 and CRP was elevated at 5.8.  He saw [facility] on 12/10 who suspected migraine and started had him switch sumatriptan and ibuprofen for naratriptan and indomethacin and started him on propranolol, which was subsequently changed to zonisamide due to propranolol causing fatigue.  Suspected that elevated CRP have be due to gout but ordered GCA temporal scan performed that day, which was negative.  He was referred to neurosurgery for evaluation of the left MCA bifurcation aneurysm  Past NSAIDS/analgesics:  ibuprofen Past abortive triptans:  sumatriptan tab Past abortive ergotamine:  *** Past muscle relaxants:  *** Past anti-emetic:  *** Past antihypertensive medications:  propranolol (fatigue) Past antidepressant medications:  *** Past anticonvulsant medications:  *** Past anti-CGRP:  *** Past vitamins/Herbal/Supplements:  *** Past antihistamines/decongestants:  *** Other past therapies:  ***  Current NSAIDS/analgesics: indomethacin 50mg , Excedrin Migraine Current triptans:  naratriptan 2.5mg  Current ergotamine:  none Current anti-emetic:  metoclopramide 10mg  Current muscle relaxants:  none Current Antihypertensive medications:  amlodipine Current Antidepressant medications:  none Current Anticonvulsant medications:  none Current anti-CGRP:  none Current Vitamins/Herbal/Supplements:  none Current Antihistamines/Decongestants:  cetirizine Other therapy:  *** Birth control:  *** Other medications:  NUBEQA, sidenafil, tamsulosin, allopurinol, colchicine   Caffeine:  *** Alcohol:  *** Smoker:  *** Diet:  *** Exercise:  *** Depression:  ***; Anxiety:  *** Other pain:  *** Sleep hygiene:  *** Family history of headache:  ***      PAST MEDICAL HISTORY: Past Medical History:  Diagnosis Date   Acute prostatitis 12/02/2007   ALLERGIC RHINITIS 02/26/2007   Asthma    Cancer (  HCC)    prostate   ERECTILE DYSFUNCTION, NON-ORGANIC, MILD 11/08/2008   Headache behind the eyes  07/03/2010   New problem noted 27 2012    Hemorrhoids    HYPERLIPIDEMIA 02/26/2007   Hypertension    Migraines    Sciatica 05/04/2008   SINUSITIS, RECURRENT 02/28/2010    PAST SURGICAL HISTORY: Past Surgical History:  Procedure Laterality Date   COLONOSCOPY     WISDOM TOOTH EXTRACTION      MEDICATIONS: Current Outpatient Medications on File Prior to Visit  Medication Sig Dispense Refill   albuterol (VENTOLIN HFA) 108 (90 Base) MCG/ACT inhaler Inhale 2 puffs into the lungs every 4 (four) hours as needed for wheezing or shortness of breath. 8 g 0   allopurinol (ZYLOPRIM) 300 MG tablet Take 300 mg by mouth daily.     amLODipine (NORVASC) 5 MG tablet TAKE 1 TABLET(5 MG) BY MOUTH DAILY 90 tablet 0   Aspirin-Acetaminophen-Caffeine (EXCEDRIN PO) Take 1 tablet by mouth 2 (two) times daily as needed (headache).     cetirizine (ZYRTEC) 10 MG tablet Take 10 mg by mouth as needed for allergies.     colchicine 0.6 MG tablet Take by mouth.     fluticasone (FLONASE) 50 MCG/ACT nasal spray Place 2 sprays into both nostrils daily. 16 g 6   gabapentin (NEURONTIN) 300 MG capsule Take 1 capsule (300 mg total) by mouth 3 (three) times daily. 90 capsule 0   ibuprofen (ADVIL) 800 MG tablet Take 1 tablet (800 mg total) by mouth 3 (three) times daily. 21 tablet 0   meloxicam (MOBIC) 7.5 MG tablet Take 1 tablet (7.5 mg total) by mouth daily as needed for pain. 30 tablet 0   metoCLOPramide (REGLAN) 10 MG tablet Take 1 tablet (10 mg total) by mouth every 6 (six) hours. For nausea, headache. 30 tablet 0   NUBEQA 300 MG tablet Take 600 mg by mouth 2 (two) times daily.     oxyCODONE-acetaminophen (PERCOCET/ROXICET) 5-325 MG tablet Take 1 tablet by mouth every 6 (six) hours as needed for severe pain (pain score 7-10). 4 tablet 0   rosuvastatin (CRESTOR) 20 MG tablet Take 1 tablet (20 mg total) by mouth daily. 90 tablet 3   sildenafil (VIAGRA) 50 MG tablet Take 1 tablet (50 mg total) by mouth daily as needed for  erectile dysfunction (take 1/2 to 1 pill as needed). 10 tablet 6   SUMAtriptan (IMITREX) 50 MG tablet TAKE 1 TABLET BY MOUTH EVERY 2 HOURS AS NEEDED FOR MIGRAINE.MAY REPEAT IN 2 HOURS IF HEADACHE PERSISTS OR RECURS. 10 tablet 6   traZODone (DESYREL) 50 MG tablet Take 1 tablet by mouth at bedtime.     No current facility-administered medications on file prior to visit.    ALLERGIES: No Known Allergies  FAMILY HISTORY: Family History  Problem Relation Age of Onset   Diabetes Brother    Colon cancer Neg Hx    Esophageal cancer Neg Hx    Rectal cancer Neg Hx    Stomach cancer Neg Hx    Gout Neg Hx    Colon polyps Neg Hx     Objective:  *** General: No acute distress.  Patient appears well-groomed.   Head:  Normocephalic/atraumatic Eyes:  fundi examined but not visualized Neck: supple, no paraspinal tenderness, full range of motion Back: No paraspinal tenderness Heart: regular rate and rhythm Lungs: Clear to auscultation bilaterally. Vascular: No carotid bruits. Neurological Exam: Mental status: alert and oriented to person, place, and time, speech fluent  and not dysarthric, language intact. Cranial nerves: CN I: not tested CN II: pupils equal, round and reactive to light, visual fields intact CN III, IV, VI:  full range of motion, no nystagmus, no ptosis CN V: facial sensation intact. CN VII: upper and lower face symmetric CN VIII: hearing intact CN IX, X: gag intact, uvula midline CN XI: sternocleidomastoid and trapezius muscles intact CN XII: tongue midline Bulk & Tone: normal, no fasciculations. Motor:  muscle strength 5/5 throughout Sensation:  Pinprick, temperature and vibratory sensation intact. Deep Tendon Reflexes:  2+ throughout,  toes downgoing.   Finger to nose testing:  Without dysmetria.   Heel to shin:  Without dysmetria.   Gait:  Normal station and stride.  Romberg negative.    Thank you for allowing me to take part in the care of this patient.  Shon Millet, DO  CC: ***

## 2023-05-23 ENCOUNTER — Ambulatory Visit: Payer: 59 | Admitting: Neurology

## 2023-05-26 ENCOUNTER — Other Ambulatory Visit: Payer: Self-pay | Admitting: Adult Health

## 2023-05-27 ENCOUNTER — Other Ambulatory Visit: Payer: Self-pay

## 2023-05-27 ENCOUNTER — Other Ambulatory Visit: Payer: 59

## 2023-05-27 MED ORDER — ROSUVASTATIN CALCIUM 20 MG PO TABS: 20.0000 mg | ORAL_TABLET | Freq: Every day | ORAL | 3 refills | Status: DC

## 2023-05-29 ENCOUNTER — Telehealth: Payer: Self-pay

## 2023-05-29 NOTE — Telephone Encounter (Signed)
 Copied from CRM [id number]. Topic: Clinical - Medication Refill >> May 26, 2023  2:53 PM Drema MATSU wrote: Most Recent Primary Care Visit:  Provider: MERNA HUXLEY  Department: [facility]  Visit Type: OFFICE VISIT  Date: 04/18/2023  Medication: rosuvastatin  (CRESTOR ) 20 MG tablet / Patient states he need medication for his Cholesterol  Has the patient contacted their pharmacy? Yes last time he tried there were no refills (Agent: If no, request that the patient contact the pharmacy for the refill. If patient does not wish to contact the pharmacy document the reason why and proceed with request.) (Agent: If yes, when and what did the pharmacy advise?)  Is this the correct pharmacy for this prescription? Yes If no, delete pharmacy and type the correct one.  This is the patient's preferred pharmacy:  [id number] - [place], [place] - [address] [place] [address] Phone: [phone] Fax: [phone]   Has the prescription been filled recently? No  Is the patient out of the medication? No  Has the patient been seen for an appointment in the last year OR does the patient have an upcoming appointment? Yes  Can we respond through MyChart? Yes  Agent: Please be advised that Rx refills may take up to 3 business days. We ask that you follow-up with your pharmacy.

## 2023-06-02 MED ORDER — ROSUVASTATIN CALCIUM 20 MG PO TABS: 20.0000 mg | ORAL_TABLET | Freq: Every day | ORAL | 3 refills | Status: AC

## 2023-06-02 NOTE — Addendum Note (Signed)
 Addended by: Waymon Amato R on: 06/02/2023 05:12 PM   Modules accepted: Orders

## 2023-06-05 ENCOUNTER — Ambulatory Visit: Payer: 59 | Admitting: Adult Health

## 2023-06-20 ENCOUNTER — Ambulatory Visit: Payer: Self-pay | Admitting: Adult Health

## 2023-06-20 NOTE — Telephone Encounter (Signed)
Noted

## 2023-06-20 NOTE — Telephone Encounter (Signed)
  Chief Complaint: fatigue Symptoms: fatigue, nausea, mild SOB with exertion Frequency: x 2 week Pertinent Negatives: Patient's wife denies chest pain, vomiting, diarrhea, dehydration Disposition: [] ED /[] Urgent Care (no appt availability in office) / [x] Appointment(In office/virtual)/ []  [facility]/ [] Home Care/ [] Refused Recommended Disposition /[] [facility]/ []  Follow-up with PCP Additional Notes: Patient's wife calling in for triage, concern for mild fatigue/weakness. She states the patient is still going to the gym daily but seems tired afterwards. She is concerned if this is a side effect of his prostate cancer hormone therapy. She is requesting appointment for patient. She states they can not come in on Monday due to the patient has doctors appointments all day. She states they can come in Tuesday. Offered appointment with NP Nafziger and wife states she would prefer if he can be seen by Dr Caryl Never as he saw him back in July and they preferred him. Wife aware to call back for any new or worsening symptoms. Copied from CRM [id number]. Topic: Appointments - Appointment Scheduling >> Jun 20, 2023  2:59 PM Isabell A wrote: Patient/patient representative is calling to schedule an appointment. Refer to attachments for appointment information.    Spouse Tiffany calling to schedule an appointment for patient - patient experiencing fatigue while exercising, out of breath while walking up the stairs (no issue with breathing currently) Decision tree states to schedule within 2 days,if no availability send to nurse triage.  Callback number: [phone] Reason for Disposition  [1] MILD weakness (i.e., does not interfere with ability to work, go to school, normal activities) AND [2] persists > 1 week  Answer Assessment - Initial Assessment Questions 1. DESCRIPTION: "Describe how you are feeling."     For example, when going up the stairs he sounds like he is breathing harder.  Patient complained to his wife he has been feeling more tired than normal.  2. SEVERITY: "How bad is it?"  "Can you stand and walk?"   - MILD (0-3): Feels weak or tired, but does not interfere with work, school or normal activities.   - MODERATE (4-7): Able to stand and walk; weakness interferes with work, school, or normal activities.   - SEVERE (8-10): Unable to stand or walk; unable to do usual activities.     Mild.  3. ONSET: "When did these symptoms begin?" (e.g., hours, days, weeks, months)     X 2 weeks.  4. CAUSE: "What do you think is causing the weakness or fatigue?" (e.g., not drinking enough fluids, medical problem, trouble sleeping)     Unsure.  5. NEW MEDICINES:  "Have you started on any new medicines recently?" (e.g., opioid pain medicines, benzodiazepines, muscle relaxants, antidepressants, antihistamines, neuroleptics, beta blockers)     There was a medication for headaches that had cause fatigue but he has not taken it in about 3 weeks. He is on hormone treatment for prostate cancer.  6. OTHER SYMPTOMS: "Do you have any other symptoms?" (e.g., chest pain, fever, cough, SOB, vomiting, diarrhea, bleeding, other areas of pain)     Mild SOB with exertion, nausea (wife states it is usual as as side effect of his hormone treatment)  Protocols used: Weakness (Generalized) and Fatigue-A-AH

## 2023-06-24 ENCOUNTER — Encounter: Payer: Self-pay | Admitting: Family Medicine

## 2023-06-24 ENCOUNTER — Ambulatory Visit: Payer: 59

## 2023-06-24 ENCOUNTER — Ambulatory Visit: Payer: 59 | Admitting: Family Medicine

## 2023-06-24 VITALS — BP 118/74 | HR 101 | Temp 97.7°F | Ht 75.0 in | Wt 265.5 lb

## 2023-06-24 DIAGNOSIS — R0600 Dyspnea, unspecified: Secondary | ICD-10-CM

## 2023-06-24 DIAGNOSIS — C61 Malignant neoplasm of prostate: Secondary | ICD-10-CM | POA: Diagnosis not present

## 2023-06-24 NOTE — Progress Notes (Signed)
Established Patient Office Visit  Subjective   Patient ID: Gerald Johnson, male    DOB: June 15, 1960  Age: 63 y.o. MRN: [id number]  Chief Complaint  Patient presents with   Fatigue    Mild SOB with Exertion, started 3 weeks ago     HPI   Gerald Johnson is seen today with 3-week history of some dyspnea with exertion and sensation of congestion in his chest.  He denies any chest pains.  No peripheral edema.  Wife also notices that he seems more short of breath with ambulation.  Symptoms may be slightly worse supine.  Denies any fevers or chills.  No history of heart failure.  No history of known CAD.  Chronic problems include history of prostate cancer, hyperlipidemia, migraine headaches.  Weight stable.  No obvious wheezing.  He does have prostate cancer as above and is maintained on Lupron injections.  No prior history of DVT or PE.  No known family history of coagulopathy.  No recent calf pain.  No pleuritic pain.   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 He has never used smokeless tobacco. He reports current alcohol use of about 7.0 standard drinks of alcohol per week. He reports that he does not use drugs. family history includes Diabetes in his brother. No Known Allergies  Review of Systems  Constitutional:  Negative for chills, fever and weight loss.  Respiratory:  Positive for shortness of breath. Negative for cough, hemoptysis and sputum production.   Cardiovascular:  Negative for chest pain and leg  swelling.  Gastrointestinal:  Negative for abdominal pain.      Objective:     BP 118/74 (BP Location: Left Arm, Patient Position: Sitting, Cuff Size: Large)   Pulse (!) 101   Temp 97.7 F (36.5 C) (Oral)   Ht 6\' 3"  (1.905 m)   Wt 265 lb 8 oz (120.4 kg)   SpO2 96%   BMI 33.19 kg/m  BP Readings from Last 3 Encounters:  06/24/23 118/74  04/29/23 (!) 138/93  04/19/23 (!) 155/69   Wt Readings from Last 3 Encounters:  06/24/23 265 lb 8 oz (120.4 kg)  04/29/23 268 lb 15.4 oz (122 kg)  04/18/23 268 lb 15.4 oz (122 kg)      Physical Exam Vitals reviewed.  Constitutional:      General: He is not in acute distress.    Appearance: He is not ill-appearing.  Cardiovascular:     Rate and Rhythm: Regular rhythm.     Heart sounds: No murmur heard.    No gallop.     Comments: Regular rhythm.  Rate is slightly up with heart rate around 105- 106 Pulmonary:     Effort: Pulmonary effort is normal.     Breath sounds: Normal breath sounds. No wheezing or rales.  Musculoskeletal:     Right lower leg: No edema.     Left lower leg: No edema.  Neurological:  Mental Status: He is alert.     No results found for any visits on 06/24/23.  Last CBC Lab Results  Component Value Date   WBC 8.7 04/18/2023   HGB 13.4 04/18/2023   HCT 41.1 04/18/2023   MCV 87.3 04/18/2023   MCH 28.5 04/18/2023   RDW 13.7 04/18/2023   PLT 270 04/18/2023   Last metabolic panel Lab Results  Component Value Date   GLUCOSE 99 04/18/2023   NA 138 04/18/2023   K 4.0 04/18/2023   CL 102 04/18/2023   CO2 29 04/18/2023   BUN 18 04/18/2023   CREATININE 1.22 04/18/2023   GFRNONAA >60 04/18/2023   CALCIUM 9.1 04/18/2023   PROT 7.0 04/16/2023   ALBUMIN 4.3 04/16/2023   BILITOT 0.7 04/16/2023   ALKPHOS 97 04/16/2023   AST 18 04/16/2023   ALT 20 04/16/2023   ANIONGAP 7 04/18/2023   Last lipids Lab Results  Component Value Date   CHOL 208 (H) 08/16/2022   HDL 50.20 08/16/2022   LDLCALC 143 (H)  08/16/2022   LDLDIRECT 166.0 03/18/2018   TRIG 74.0 08/16/2022   CHOLHDL 4 08/16/2022   Last thyroid functions Lab Results  Component Value Date   TSH 0.77 06/08/2021      The 10-year ASCVD risk score (Arnett DK, et al., 2019) is: 12.6%    Assessment & Plan:   Problem List Items Addressed This Visit   None Visit Diagnoses       Dyspnea, unspecified type    -  Primary   Relevant Orders   DG Chest 2 View   EKG 12-Lead     63 year old male with history of hypertension, hyperlipidemia, prostate cancer.  Seen today with 3-week history of dyspnea with exertion.  No cardiac history.  Denies any chest pain whatsoever.  -Obtain EKG= normal sinus rhythm with no acute ST-T changes. -PA and lateral chest x-ray -Check labs with CBC, CMP, D-dimer, BNP -If above unrevealing consider echocardiogram to further assess -Follow-up immediately for any progressive shortness of breath, chest pain, peripheral edema, or any other new symptoms  No follow-ups on file.    Evelena Peat, MD

## 2023-06-25 LAB — COMPREHENSIVE METABOLIC PANEL
ALT: 23 U/L (ref 0–53)
AST: 25 U/L (ref 0–37)
Albumin: 4.1 g/dL (ref 3.5–5.2)
Alkaline Phosphatase: 75 U/L (ref 39–117)
BUN: 15 mg/dL (ref 6–23)
CO2: 30 meq/L (ref 19–32)
Calcium: 9.7 mg/dL (ref 8.4–10.5)
Chloride: 103 meq/L (ref 96–112)
Creatinine, Ser: 1.3 mg/dL (ref 0.40–1.50)
GFR: 58.85 mL/min — ABNORMAL LOW (ref >60.00)
Glucose, Bld: 103 mg/dL — ABNORMAL HIGH (ref 70–99)
Potassium: 3.9 meq/L (ref 3.5–5.1)
Sodium: 140 meq/L (ref 135–145)
Total Bilirubin: 0.7 mg/dL (ref 0.2–1.2)
Total Protein: 7 g/dL (ref 6.0–8.3)

## 2023-06-25 LAB — CBC WITH DIFFERENTIAL/PLATELET
Basophils Absolute: 0.1 10*3/uL (ref 0.0–0.1)
Basophils Relative: 0.8 % (ref 0.0–3.0)
Eosinophils Absolute: 0.3 10*3/uL (ref 0.0–0.7)
Eosinophils Relative: 4 % (ref 0.0–5.0)
HCT: 39.4 % (ref 39.0–52.0)
Hemoglobin: 12.8 g/dL — ABNORMAL LOW (ref 13.0–17.0)
Lymphocytes Relative: 30.5 % (ref 12.0–46.0)
Lymphs Abs: 2.2 10*3/uL (ref 0.7–4.0)
MCHC: 32.4 g/dL (ref 30.0–36.0)
MCV: 86.8 fL (ref 78.0–100.0)
Monocytes Absolute: 0.7 10*3/uL (ref 0.1–1.0)
Monocytes Relative: 9.9 % (ref 3.0–12.0)
Neutro Abs: 4 10*3/uL (ref 1.4–7.7)
Neutrophils Relative %: 54.8 % (ref 43.0–77.0)
Platelets: 304 10*3/uL (ref 150.0–400.0)
RBC: 4.55 Mil/uL (ref 4.22–5.81)
RDW: 13.3 % (ref 11.5–15.5)
WBC: 7.2 10*3/uL (ref 4.0–10.5)

## 2023-06-25 LAB — BRAIN NATRIURETIC PEPTIDE: Pro B Natriuretic peptide (BNP): 12 pg/mL (ref 0.0–100.0)

## 2023-06-25 LAB — D-DIMER, QUANTITATIVE: D-Dimer, Quant: 0.44 ug{FEU}/mL (ref <0.50)

## 2023-06-25 NOTE — Addendum Note (Signed)
Addended by: Kristian Covey on: 06/25/2023 12:27 PM   Modules accepted: Orders

## 2023-07-09 ENCOUNTER — Encounter: Payer: Self-pay | Admitting: Family Medicine

## 2023-07-09 ENCOUNTER — Ambulatory Visit: Payer: 59 | Admitting: Family Medicine

## 2023-07-09 VITALS — BP 126/80 | HR 99 | Temp 98.4°F | Wt 264.9 lb

## 2023-07-09 DIAGNOSIS — M25562 Pain in left knee: Secondary | ICD-10-CM | POA: Diagnosis not present

## 2023-07-09 DIAGNOSIS — M79671 Pain in right foot: Secondary | ICD-10-CM

## 2023-07-09 MED ORDER — MELOXICAM 15 MG PO TABS: 15.0000 mg | ORAL_TABLET | Freq: Every day | ORAL | 0 refills | Status: DC

## 2023-07-09 NOTE — Progress Notes (Signed)
Established Patient Office Visit  Subjective   Patient ID: Gerald Johnson, male    DOB: [birth year]  Age: 63 y.o. MRN: [id number]  Chief Complaint  Patient presents with   Foot Pain    Patient complains of left foot pain, x3 months    Knee Pain     Patient complains of left knee pain, x2 days     HPI   Gerald Johnson is seen with couple of orthopedic complaints.  First complaint is right foot pain for the past 3 months.  Denies injury.  He initially thought this may be gout.  Some pain around the MTP joint but really more along the arch and spreading more proximally.  Has not noted any recent warmth or erythema.  He reportedly went to podiatrist yesterday had x-rays which showed no acute findings.  His pain is starting to limit his walking somewhat more.  He denies any recent change of shoewear. Does have reported history of gout.  Second issue is left knee pain past couple days.  Denies injury.  Has noticed some mild swelling.  No warmth.  No erythema.  No locking or giving way.  Pain is mostly anterior and slightly lateral.  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 He has never used smokeless tobacco. He reports current alcohol use of about 7.0 standard drinks of alcohol per week. He reports that he does not use drugs. family history includes Diabetes in his brother. No Known Allergies  Review of Systems  Constitutional:  Negative for chills and fever.      Objective:     BP 126/80 (BP Location:  Left Arm, Patient Position: Sitting, Cuff Size: Large)   Pulse 99   Temp 98.4 F (36.9 C) (Oral)   Wt 264 lb 14.4 oz (120.2 kg)   SpO2 96%   BMI 33.11 kg/m    Physical Exam Vitals reviewed.  Constitutional:      General: He is not in acute distress.    Appearance: He is not ill-appearing.  Cardiovascular:     Rate and Rhythm: Normal rate.  Musculoskeletal:     Comments: Left knee reveals small effusion.  No warmth.  No erythema.  No medial or lateral joint line tenderness.  Full range of motion.  No significant bony tenderness.  Ligament testing is normal  Right foot very flat arches.  No foot callus or ulcer noted.  Does not have any warmth or erythema or visible swelling of the MTP joint.  No localized bony tenderness.  Neurological:     Mental Status: He is alert.      No results found for any visits on 07/09/23.    The 10-year ASCVD risk score (Arnett DK, et al., 2019) is: 14.2%    Assessment & Plan:   #1 acute left knee pain.  Has very small effusion.  Etiology unclear.  Recommend short-term trial of meloxicam 15 mg once daily.  Avoid squats or leg presses with his weight lifting until this is improved  #2 persistent 30-month history of right foot pain.  This is mostly involving the arch.  This does not sound typical of his gout.  No evidence for acute inflammation currently.  Recommend setting up sports medicine referral for further evaluation and patient agrees   No follow-ups on file.    Evelena Peat, MD

## 2023-07-11 ENCOUNTER — Ambulatory Visit: Payer: 59 | Admitting: Family Medicine

## 2023-07-23 NOTE — Progress Notes (Unsigned)
 [facility] [address] [place] [address] Phone: [phone] Subjective:   INadine Counts, am serving as a scribe for Dr. Antoine Primas.  I'm seeing this patient by the request  of:  Shirline Frees, NP  CC: left knee pain   [id number]  Gerald Johnson is a 63 y.o. male coming in with complaint of L knee pain. Referred by PCP.  Recently been seen by another provider and was put on meloxicam.  Patient states pain started about 3 weeks ago, usually consistent. Was taking ibuprofen and meloxicam. Meloxicam did help. Pain is on medial side. Initial pain was out of nowhere.  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   Headache behind the eyes 07/03/2010   New problem noted 27 2012    Hemorrhoids    HYPERLIPIDEMIA 02/26/2007   Hypertension    Migraines    Sciatica 05/04/2008   SINUSITIS, RECURRENT 02/28/2010   Past Surgical History:  Procedure Laterality Date   COLONOSCOPY     WISDOM TOOTH EXTRACTION     Social History   Socioeconomic History   Marital status: Single    Spouse name: Not on file   Number of children: Not on file   Years of education: Not on file   Highest education level: Not on file  Occupational History   Not on file  Tobacco Use   Smoking status: Never   Smokeless tobacco: Never  Vaping Use   Vaping status: Never Used  Substance and Sexual Activity   Alcohol use: Yes    Alcohol/week: 7.0 standard drinks of alcohol    Types: 7 Cans of beer per week    Comment: soically   Drug use: No   Sexual activity: Yes    Birth control/protection: Condom  Other Topics Concern   Not on file  Social History Narrative   Works as a Data processing manager.    Not married    No kids    He likes to go to the gym. Likes to go to go to restaurants.    Social Drivers of Corporate investment banker Strain: Not on file  Food Insecurity: Not on file  Transportation Needs: Not on file  Physical Activity: Not on file  Stress: Not on file  Social Connections: Unknown (10/29/2022)   Received from [facility]   Social Network    Social Network: Not on file   No Known Allergies Family History  Problem Relation Age of Onset   Diabetes Brother    Colon cancer Neg Hx    Esophageal cancer Neg Hx    Rectal cancer Neg Hx    Stomach cancer Neg Hx    Gout Neg Hx    Colon polyps Neg Hx      Current Outpatient Medications (Cardiovascular):    amLODipine (NORVASC) 5 MG tablet, TAKE 1 TABLET(5 MG) BY MOUTH DAILY   rosuvastatin (CRESTOR) 20 MG tablet, Take 1 tablet (20 mg total) by mouth daily.   sildenafil (VIAGRA) 50 MG tablet, Take 1 tablet (50 mg total) by mouth daily as needed for erectile dysfunction (take 1/2 to 1 pill as needed).  Current Outpatient Medications (Respiratory):  albuterol (VENTOLIN HFA) 108 (90 Base) MCG/ACT inhaler, Inhale 2 puffs into the lungs every 4 (four) hours as needed for wheezing or shortness of breath.   cetirizine (ZYRTEC) 10 MG tablet, Take 10 mg by mouth as needed for allergies.   fluticasone (FLONASE) 50 MCG/ACT nasal spray, Place 2 sprays into both nostrils daily.  Current Outpatient Medications (Analgesics):    allopurinol (ZYLOPRIM) 300 MG tablet, Take 300 mg by mouth daily.   Aspirin-Acetaminophen-Caffeine (EXCEDRIN PO), Take 1 tablet by mouth 2 (two) times daily as needed (headache).   colchicine 0.6 MG tablet, Take by mouth.   meloxicam (MOBIC) 15 MG tablet, Take 1 tablet (15 mg total) by mouth daily.   oxyCODONE-acetaminophen (PERCOCET/ROXICET) 5-325 MG tablet, Take 1 tablet by mouth every 6 (six) hours as needed for severe pain (pain score 7-10).   SUMAtriptan (IMITREX) 50 MG tablet, TAKE 1  TABLET BY MOUTH EVERY 2 HOURS AS NEEDED FOR MIGRAINE.MAY REPEAT IN 2 HOURS IF HEADACHE PERSISTS OR RECURS.   Current Outpatient Medications (Other):    gabapentin (NEURONTIN) 300 MG capsule, Take 1 capsule (300 mg total) by mouth 3 (three) times daily.   metoCLOPramide (REGLAN) 10 MG tablet, Take 1 tablet (10 mg total) by mouth every 6 (six) hours. For nausea, headache.   NUBEQA 300 MG tablet, Take 600 mg by mouth 2 (two) times daily.   traZODone (DESYREL) 50 MG tablet, Take 1 tablet by mouth at bedtime.   Reviewed prior external information including notes and imaging from  primary care provider As well as notes that were available from care everywhere and other healthcare systems.  Past medical history, social, surgical and family history all reviewed in electronic medical record.  No pertanent information unless stated regarding to the chief complaint.   Review of Systems:  No headache, visual changes, nausea, vomiting, diarrhea, constipation, dizziness, abdominal pain, skin rash, fevers, chills, night sweats, weight loss, swollen lymph nodes, body aches, joint swelling, chest pain, shortness of breath, mood changes. POSITIVE muscle aches  Objective  Pulse 91, height 6\' 3"  (1.905 m), weight 265 lb (120.2 kg), SpO2 98%.   General: No apparent distress alert and oriented x3 mood and affect normal, dressed appropriately.  HEENT: Pupils equal, extraocular movements intact  Respiratory: Patient's speak in full sentences and does not appear short of breath  Cardiovascular: No lower extremity edema, non tender, no erythema  Left knee exam shows patient does have swelling noted.  Seems to be in the patellofemoral joint.  Does have crepitus noted.  Some instability with valgus and varus force noted.  Limited muscular skeletal ultrasound was performed and interpreted by Antoine Primas, M   Limited ultrasound shows some hypoechoic changes noted of the patellofemoral space.  Does have thickening  of the capsule noted as well. Does have significant narrowing of the medial joint space as well. Impression: Severe arthritis with effusion  Procedure: Real-time Ultrasound Guided Injection of left knee Device: GE Logiq Q7 Ultrasound guided injection is preferred based studies that show increased duration, increased effect, greater accuracy, decreased procedural pain, increased response rate, and decreased cost with ultrasound guided versus blind injection.  Verbal informed consent obtained.  Time-out conducted.  Noted no overlying erythema, induration, or other signs of local infection.  Skin prepped in a sterile fashion.  Local anesthesia: Topical Ethyl chloride.  With sterile technique and under real time ultrasound guidance: With a 22-gauge 2 inch needle patient was injected with 4 cc of 0.5% Marcaine and aspirated 10 cc of  blood-tinged fluid that seems to have also ganglion cyst aspect.  1 cc of Kenalog 40 mg/dL. This was from a superior lateral approach.  Completed without difficulty  Pain immediately resolved suggesting accurate placement of the medication.  Advised to call if fevers/chills, erythema, induration, drainage, or persistent bleeding.  Images permanently stored  Impression: Technically successful ultrasound guided injection.   Impression and Recommendations:    The above documentation has been reviewed and is accurate and complete Judi Saa, DO

## 2023-07-25 ENCOUNTER — Ambulatory Visit: Payer: 59 | Admitting: Family Medicine

## 2023-07-25 ENCOUNTER — Telehealth: Payer: Self-pay

## 2023-07-25 ENCOUNTER — Ambulatory Visit (INDEPENDENT_AMBULATORY_CARE_PROVIDER_SITE_OTHER): Payer: 59

## 2023-07-25 ENCOUNTER — Encounter: Payer: Self-pay | Admitting: Family Medicine

## 2023-07-25 ENCOUNTER — Other Ambulatory Visit: Payer: Self-pay

## 2023-07-25 VITALS — HR 91 | Ht 75.0 in | Wt 265.0 lb

## 2023-07-25 DIAGNOSIS — M1712 Unilateral primary osteoarthritis, left knee: Secondary | ICD-10-CM | POA: Diagnosis not present

## 2023-07-25 DIAGNOSIS — M2550 Pain in unspecified joint: Secondary | ICD-10-CM | POA: Diagnosis not present

## 2023-07-25 DIAGNOSIS — M25562 Pain in left knee: Secondary | ICD-10-CM | POA: Diagnosis not present

## 2023-07-25 NOTE — Telephone Encounter (Signed)
 Case number is [id number]. This is for the left knee.

## 2023-07-25 NOTE — Assessment & Plan Note (Signed)
 Aspiration done today and tolerated the procedure well, discussed icing regimen of home exercises, which activities to do and which ones to avoid.  Do believe the patient will need potentially bracing as well as viscosupplementation as a possible treatment option.  Will send aspiration to the lab to further evaluate for any type of underlying gout.  Past medical history is significant for prostate cancer and advanced imaging would be warranted if continuing to have difficulty.  Follow-up with me again in 4 to 6 weeks.

## 2023-07-25 NOTE — Patient Instructions (Addendum)
 Xrays today Do prescribed exercises at least 3x a week  Drained knee today Good to see you. Ice, voltaren gel. Return in 6 to 8 weeks.

## 2023-07-26 LAB — SYNOVIAL FLUID ANALYSIS, COMPLETE
Basophils, %: 0 %
Eosinophils-Synovial: 0 % (ref 0–2)
Lymphocytes-Synovial Fld: 31 % (ref 0–74)
Monocyte/Macrophage: 6 % (ref 0–69)
Neutrophil, Synovial: 60 % — ABNORMAL HIGH (ref 0–24)
Synoviocytes, %: 3 % (ref 0–15)
WBC, Synovial: 1526 {cells}/uL — ABNORMAL HIGH (ref <150)

## 2023-07-28 ENCOUNTER — Encounter: Payer: Self-pay | Admitting: Family Medicine

## 2023-07-28 ENCOUNTER — Telehealth: Payer: Self-pay

## 2023-07-28 NOTE — Telephone Encounter (Signed)
**  patient is scheduled for 09/16/23**  Durolane authorized for left knee Plan covers 85% of the allowable amount for durolane Plan covers 100% of the allowable amount for procedure 20610/20611 Deductible has been met Patient has a $40 copay whether or not a OV is billed Only one copay per DOS Once OOP has been met coverage goes to 100% and copay no longer applies Deductible $600 has met $600 OOP max $3000 has met $750.74

## 2023-07-29 NOTE — Telephone Encounter (Signed)
 Noted.

## 2023-08-03 ENCOUNTER — Other Ambulatory Visit: Payer: Self-pay | Admitting: Adult Health

## 2023-08-03 DIAGNOSIS — I10 Essential (primary) hypertension: Secondary | ICD-10-CM

## 2023-08-13 ENCOUNTER — Ambulatory Visit (INDEPENDENT_AMBULATORY_CARE_PROVIDER_SITE_OTHER): Payer: 59

## 2023-08-13 DIAGNOSIS — R0600 Dyspnea, unspecified: Secondary | ICD-10-CM

## 2023-08-13 LAB — ECHOCARDIOGRAM COMPLETE
Area-P 1/2: 3.42 cm2
S' Lateral: 2.69 cm

## 2023-08-15 ENCOUNTER — Encounter: Payer: Self-pay | Admitting: Family Medicine

## 2023-08-22 ENCOUNTER — Ambulatory Visit: Payer: 59 | Admitting: Neurology

## 2023-09-11 NOTE — Progress Notes (Signed)
 [facility] [address] [place] [address] Phone: [phone] Subjective:   IBryan Caprio, am serving as a scribe for Dr. Ronnell Coins.  I'm seeing this patient by the request  of:  Alto Atta, NP  CC: Left knee pain  [id number]  07/25/2023 Aspiration done today and tolerated the procedure well, discussed icing regimen of home exercises, which activities to do and which ones to avoid.  Do believe the patient will need potentially bracing as well as viscosupplementation as a possible treatment option.  Will send aspiration to the lab to further evaluate for any type of underlying gout.  Past medical history is significant for prostate cancer and advanced imaging would be warranted if continuing to have difficulty.  Follow-up with me again in 4 to 6 weeks.     Update 09/16/2023 Giang Hemme Habeeb is a 63 y.o. male coming in with complaint of L knee pain. Patient here for Durolane injection. Patient states has been doing pretty good.   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   Headache behind the eyes 07/03/2010   New problem noted 27 2012    Hemorrhoids    HYPERLIPIDEMIA 02/26/2007   Hypertension    Migraines    Sciatica 05/04/2008   SINUSITIS, RECURRENT 02/28/2010   Past Surgical History:  Procedure Laterality Date   COLONOSCOPY     WISDOM TOOTH EXTRACTION     Social History   Socioeconomic History   Marital status: Single    Spouse name: Not on file   Number of children: Not on file   Years of education: Not on file   Highest education level: Not on file  Occupational History   Not on file  Tobacco Use   Smoking status: Never   Smokeless tobacco: Never  Vaping Use   Vaping status: Never Used  Substance and Sexual Activity   Alcohol use: Yes    Alcohol/week: 7.0  standard drinks of alcohol    Types: 7 Cans of beer per week    Comment: soically   Drug use: No   Sexual activity: Yes    Birth control/protection: Condom  Other Topics Concern   Not on file  Social History Narrative   Works as a Doctor, hospital.    Not married    No kids    He likes to go to the gym. Likes to go to go to restaurants.    Social Drivers of Corporate investment banker Strain: Not on file  Food Insecurity: Not on file  Transportation Needs: Not on file  Physical Activity: Not on file  Stress: Not on file  Social Connections: Unknown (10/29/2022)   Received [facility]   Social Network    Social Network: Not on file   No Known Allergies Family History  Problem Relation Age of Onset   Diabetes Brother    Colon cancer Neg Hx    Esophageal cancer Neg Hx    Rectal cancer Neg Hx    Stomach cancer Neg Hx    Gout Neg Hx    Colon polyps Neg Hx      Current Outpatient Medications (Cardiovascular):    amLODipine  (NORVASC ) 5 MG tablet, TAKE 1 TABLET(5 MG) BY MOUTH DAILY   rosuvastatin  (CRESTOR ) 20 MG tablet, Take 1 tablet (20 mg  total) by mouth daily.   sildenafil  (VIAGRA ) 50 MG tablet, Take 1 tablet (50 mg total) by mouth daily as needed for erectile dysfunction (take 1/2 to 1 pill as needed).  Current Outpatient Medications (Respiratory):    albuterol  (VENTOLIN  HFA) 108 (90 Base) MCG/ACT inhaler, Inhale 2 puffs into the lungs every 4 (four) hours as needed for wheezing or shortness of breath.   cetirizine  (ZYRTEC ) 10 MG tablet, Take 10 mg by mouth as needed for allergies.   fluticasone  (FLONASE ) 50 MCG/ACT nasal spray, Place 2 sprays into both nostrils daily.  Current Outpatient Medications (Analgesics):    allopurinol (ZYLOPRIM) 300 MG tablet, Take 300 mg by mouth daily.   Aspirin-Acetaminophen -Caffeine  (EXCEDRIN PO), Take 1 tablet by mouth 2 (two) times daily as needed (headache).   colchicine  0.6 MG tablet, Take by mouth.   meloxicam  (MOBIC ) 15 MG  tablet, Take 1 tablet (15 mg total) by mouth daily.   oxyCODONE -acetaminophen  (PERCOCET/ROXICET) 5-325 MG tablet, Take 1 tablet by mouth every 6 (six) hours as needed for severe pain (pain score 7-10).   SUMAtriptan  (IMITREX ) 50 MG tablet, TAKE 1 TABLET BY MOUTH EVERY 2 HOURS AS NEEDED FOR MIGRAINE.MAY REPEAT IN 2 HOURS IF HEADACHE PERSISTS OR RECURS.   Current Outpatient Medications (Other):    gabapentin  (NEURONTIN ) 300 MG capsule, Take 1 capsule (300 mg total) by mouth 3 (three) times daily.   metoCLOPramide  (REGLAN ) 10 MG tablet, Take 1 tablet (10 mg total) by mouth every 6 (six) hours. For nausea, headache.   NUBEQA 300 MG tablet, Take 600 mg by mouth 2 (two) times daily.   traZODone (DESYREL) 50 MG tablet, Take 1 tablet by mouth at bedtime.   triamcinolone  cream (KENALOG ) 0.1 %, Apply 1 Application topically 2 (two) times daily as needed.   Reviewed prior external information including notes and imaging from  primary care provider As well as notes that were available from care everywhere and other healthcare systems.  Past medical history, social, surgical and family history all reviewed in electronic medical record.  No pertanent information unless stated regarding to the chief complaint.   Review of Systems:  No headache, visual changes, nausea, vomiting, diarrhea, constipation, dizziness, abdominal pain, skin rash, fevers, chills, night sweats, weight loss, swollen lymph nodes, body aches, joint swelling, chest pain, shortness of breath, mood changes. POSITIVE muscle aches  Objective  Blood pressure 126/84, pulse (!) 105, height 6\' 3"  (1.905 m), weight 268 lb (121.6 kg), SpO2 96%.   General: No apparent distress alert and oriented x3 mood and affect normal, dressed appropriately.  HEENT: Pupils equal, extraocular movements intact  Respiratory: Patient's speak in full sentences and does not appear short of breath  Cardiovascular: No lower extremity edema, non tender, no erythema   Left knee does have some arthritic changes noted but no significant effusion noted.  Right knee does have some tenderness to palpation of the medial joint space as well.  After informed written and verbal consent, patient was seated on exam table. Left knee was prepped with alcohol swab and utilizing anterolateral approach, patient's left knee space was injected with 60 mg per 3 mL of Durolane (sodium hyaluronate) in a prefilled syringe was injected easily into the knee through a 22-gauge needle..Patient tolerated the procedure well without immediate complications.    Impression and Recommendations:     The above documentation has been reviewed and is accurate and complete Krysti Hickling M Devinn Voshell, DO

## 2023-09-16 ENCOUNTER — Encounter: Payer: Self-pay | Admitting: Family Medicine

## 2023-09-16 ENCOUNTER — Ambulatory Visit: Admitting: Family Medicine

## 2023-09-16 ENCOUNTER — Ambulatory Visit: Payer: 59 | Admitting: Family Medicine

## 2023-09-16 VITALS — BP 126/84 | HR 105 | Ht 75.0 in | Wt 268.0 lb

## 2023-09-16 VITALS — BP 128/74 | HR 79 | Temp 98.7°F | Ht 75.0 in | Wt 267.3 lb

## 2023-09-16 DIAGNOSIS — M1A9XX0 Chronic gout, unspecified, without tophus (tophi): Secondary | ICD-10-CM | POA: Diagnosis not present

## 2023-09-16 DIAGNOSIS — R21 Rash and other nonspecific skin eruption: Secondary | ICD-10-CM

## 2023-09-16 DIAGNOSIS — M1712 Unilateral primary osteoarthritis, left knee: Secondary | ICD-10-CM

## 2023-09-16 MED ORDER — TRIAMCINOLONE ACETONIDE 0.1 % EX CREA
1.0000 | TOPICAL_CREAM | Freq: Two times a day (BID) | CUTANEOUS | 1 refills | Status: AC | PRN
Start: 2023-09-16 — End: ?

## 2023-09-16 MED ORDER — SODIUM HYALURONATE 60 MG/3ML IX PRSY
60.0000 mg | PREFILLED_SYRINGE | Freq: Once | INTRA_ARTICULAR | Status: AC
  Administered 2023-09-16: 60 mg via INTRA_ARTICULAR

## 2023-09-16 NOTE — Assessment & Plan Note (Signed)
 Patient given injection and tolerated the procedure well.  Discussed potentially getting x-rays of the contralateral knee with patient having some complaint about it at the end.  Discussed icing regimen of home exercises.  Increase activity slowly.  Follow-up with me again in 6 to 8 weeks otherwise.  Can repeat injection every 3 months if needed.  Does have a history of gout that needs to be continue to be monitored.

## 2023-09-16 NOTE — Patient Instructions (Signed)
 Recommend liberal use of moisturizers such as Eucerin- especially after bathing.   May use the prescription cream up to twice daily as needed.

## 2023-09-16 NOTE — Progress Notes (Signed)
   Established Patient Office Visit  Subjective   Patient ID: Gerald Johnson, male    DOB: Nov 14, 1960  Age: 62 y.o. MRN: [id number]  Chief Complaint  Patient presents with   Rash    Patient came in today for a rash that started 3 weeks ago, on the left side of his torso, the rash is dark and red scale, itchy     HPI   Gerald Johnson is seen with pruritic dry scaly rash left side which occurred about 3 weeks ago.  Denies any other areas of rash.  Does have at least moderate pruritus.  Has been applying some topical alcohol which provides temporary relief.  No recent change of soaps or detergents.  No known history of eczema.  No new medications.  No urticaria.  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 He has never used smokeless tobacco. He reports current alcohol use of about 7.0 standard drinks of alcohol per week. He reports that he does not use drugs. family history includes Diabetes in his brother. No Known Allergies  Review of Systems  Constitutional:  Negative for chills and fever.  Skin:  Positive for itching and rash.      Objective:     BP 128/74 (BP Location: Left Arm, Patient Position: Sitting, Cuff Size: Large)   Pulse 79   Temp 98.7 F (37.1 C) (Oral)   Ht 6\' 3"  (1.905 m)   Wt 267 lb 4.8 oz (121.2 kg)   SpO2 (!) 79%   BMI 33.41 kg/m    Physical Exam Vitals reviewed.  Constitutional:      General: He is not in acute distress.    Appearance: He is not ill-appearing.   Cardiovascular:     Rate and Rhythm: Normal rate and regular rhythm.  Skin:    Findings: Rash present.     Comments: Left lower trunk region reveals some dry skin and nonspecific eczematous type rash.  No urticaria.  No vesicles.  No pustules.  Few faint excoriations.  Neurological:     Mental Status: He is alert.      No results found for any visits on 09/16/23.    The 10-year ASCVD risk score (Arnett DK, et al., 2019) is: 14.6%    Assessment & Plan:   Eczematous type skin rash confined to the left lower side.  Avoid topical alcohol which could produce further drying.  Avoid prolonged hot showers.  Recommend liberal use of moisturizer such as CeraVe or Eucerin and start triamcinolone  0.1% cream once or twice daily as needed.  Be in touch if rash persist  Gerald Lamy, MD

## 2023-09-16 NOTE — Patient Instructions (Addendum)
 Gel injection today Good to see you! Have fun at the [employer] [facility] [place] [facility] sandals See you again in 3 months

## 2023-09-17 ENCOUNTER — Telehealth: Payer: Self-pay | Admitting: Family Medicine

## 2023-09-17 NOTE — Telephone Encounter (Signed)
 Sent patient MyChart message.

## 2023-09-17 NOTE — Telephone Encounter (Signed)
 Pt wife called, he recd gel injection yesterday. He expected some tightness but has instead noted pain at a 4-5 level since last night. No swelling, pt concerned if this is a normal side effect.

## 2023-09-17 NOTE — Telephone Encounter (Signed)
 Called pt, given Dr. Shirlie Dove response. Pt understood.

## 2023-10-03 ENCOUNTER — Encounter: Payer: Self-pay | Admitting: Family Medicine

## 2023-10-03 ENCOUNTER — Ambulatory Visit: Admitting: Family Medicine

## 2023-10-03 VITALS — BP 106/66 | HR 83 | Temp 98.4°F | Wt 265.6 lb

## 2023-10-03 DIAGNOSIS — C61 Malignant neoplasm of prostate: Secondary | ICD-10-CM

## 2023-10-03 DIAGNOSIS — C7951 Secondary malignant neoplasm of bone: Secondary | ICD-10-CM | POA: Diagnosis not present

## 2023-10-03 DIAGNOSIS — M549 Dorsalgia, unspecified: Secondary | ICD-10-CM

## 2023-10-03 LAB — PSA: PSA: 0.03 ng/mL — ABNORMAL LOW (ref 0.10–4.00)

## 2023-10-03 MED ORDER — MELOXICAM 15 MG PO TABS: 15.0000 mg | ORAL_TABLET | Freq: Every day | ORAL | 0 refills | Status: DC

## 2023-10-03 NOTE — Patient Instructions (Signed)
 We are setting up MRI lumbar spine for further assess  Let me know if you have not back by end of next week regarding the MRI.

## 2023-10-03 NOTE — Progress Notes (Unsigned)
 Established Patient Office Visit  Subjective   Patient ID: Gerald Johnson, male    DOB: [birth year]  Age: 63 y.o. MRN: [id number]  Chief Complaint  Patient presents with   Back Pain    Patient complains of back pain,x1 week, Denies any known injury     HPI  {History (Optional):23778} Gerald Johnson is here with approximate 1 week history of low back pain.  This is mid lumbar area.  Denies any injury.  Worse with standing.  No radiculitis symptoms.  Denies any fever, chills, appetite change, or weight loss.  They are very concerned though because of his history of prostate cancer.  He apparently was diagnosed 2023 with mets to the right femur and spine region.  He is currently treated with Lupron and Nubeqa.  Last PSA was near 0.  He has follow-up with his oncologist this summer in June.  Denies any urine or stool incontinence.  He has not taken anything thus far for the low back pain.  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 He has never used smokeless tobacco. He reports current alcohol use of about 7.0 standard drinks of alcohol per week. He reports that he does not use drugs. family history includes Diabetes in his brother. No Known Allergies  Review of Systems  Constitutional:  Negative for chills, fever and weight loss.  Gastrointestinal:  Negative for abdominal pain.  Genitourinary:  Negative for dysuria.  Musculoskeletal:  Positive for back pain.  Neurological:  Negative for sensory change and focal  weakness.      Objective:      BP 106/66 (BP Location: Left Arm, Patient Position: Sitting, Cuff Size: Large)   Pulse 83   Temp 98.4 F (36.9 C) (Oral)   Wt 265 lb 9.6 oz (120.5 kg)   SpO2 96%   BMI 33.20 kg/m  BP Readings from Last 3 Encounters:  10/03/23 106/66  09/16/23 128/74  09/16/23 126/84   Wt Readings from Last 3 Encounters:  10/03/23 265 lb 9.6 oz (120.5 kg)  09/16/23 267 lb 4.8 oz (121.2 kg)  09/16/23 268 lb (121.6 kg)      Physical Exam Vitals reviewed.  Constitutional:      General: He is not in acute distress.    Appearance: He is not ill-appearing.  Cardiovascular:     Rate and Rhythm: Normal rate and regular rhythm.  Pulmonary:     Effort: Pulmonary effort is normal.     Breath sounds: Normal breath sounds.  Musculoskeletal:     Right lower leg: No edema.     Left lower leg: No edema.     Comments: No spinal tenderness to palpation  Neurological:     Mental Status: He is alert.     Comments: Only trace knee and ankle reflex bilaterally.  No weakness with plantarflexion or dorsiflexion      Results for orders placed or performed in visit on 10/03/23  PSA  Result Value Ref Range   PSA 0.03 (L) 0.10 - 4.00 ng/mL    {Labs (Optional):23779}  The 10-year ASCVD risk score (Arnett DK, et al., 2019) is: 10.4%    Assessment & Plan:   Patient has history of metastatic prostate cancer.  Currently on Lupron and Nubeqa.  History of reported mets to right femur and spine region.  They are very concerned with his new onset low back pain in the absence of injury.  Given his history of metastatic prostate cancer as above we will try to get MRI to further assess.  We recommend short-term trial of meloxicam  15 mg daily and may supplement with Tylenol  as needed.  New prescription for meloxicam  sent   No follow-ups on file.    Gerald Lamy, MD

## 2023-10-17 ENCOUNTER — Telehealth: Payer: Self-pay

## 2023-10-17 ENCOUNTER — Ambulatory Visit
Admission: RE | Admit: 2023-10-17 | Discharge: 2023-10-17 | Disposition: A | Discharge status: Home / Self Care | Source: Ambulatory Visit | Attending: Family Medicine | Admitting: Family Medicine

## 2023-10-17 ENCOUNTER — Telehealth: Payer: Self-pay | Admitting: Family Medicine

## 2023-10-17 DIAGNOSIS — C7951 Secondary malignant neoplasm of bone: Secondary | ICD-10-CM

## 2023-10-17 DIAGNOSIS — C61 Malignant neoplasm of prostate: Secondary | ICD-10-CM

## 2023-10-17 MED ORDER — GADOPICLENOL 0.5 MMOL/ML IV SOLN
10.0000 mL | Freq: Once | INTRAVENOUS | Status: AC | PRN
  Administered 2023-10-17: 10 mL via INTRAVENOUS

## 2023-10-17 NOTE — Telephone Encounter (Signed)
 Pt's wife called for status update, hoping to see this changed to STAT today. Please advise.

## 2023-10-17 NOTE — Telephone Encounter (Signed)
 Spouse dropped off form from [facility] that needs signature from provider. Spouse also asked if Dr could updated MRI status from regular to stat so they can get results as soon as possible.

## 2023-10-17 NOTE — Telephone Encounter (Signed)
 I spoke with Gerald Johnson and this has been placed for review. Patient awar

## 2023-10-17 NOTE — Telephone Encounter (Signed)
 Copied from CRM [id number]. Topic: General - Other >> Oct 17, 2023 11:34 AM Magdalene School wrote: Reason for CRM: patient called to ask if Dr. Gaila Josephs could contact [facility] to request that the status of a recently completed MRI be updated to STAT, in order to expedite the results.  Phone: [phone] Fax: [phone]

## 2023-10-20 ENCOUNTER — Ambulatory Visit: Payer: Self-pay | Admitting: Family Medicine

## 2023-10-21 NOTE — Addendum Note (Signed)
 Addended by: Aurelio Leer on: 10/21/2023 08:50 AM   Modules accepted: Orders

## 2023-10-22 ENCOUNTER — Telehealth: Payer: Self-pay | Admitting: Adult Health

## 2023-10-22 NOTE — Telephone Encounter (Signed)
 Copied from CRM [id number]. Topic: General - Other >> Oct 22, 2023  1:31 PM Felizardo Hotter wrote: Reason for CRM: Received call from [place] Lied [phone] with calling on behalf of pt regarding disable sticker for vehicle.

## 2023-10-23 NOTE — Telephone Encounter (Signed)
 Left message to return phone call.

## 2023-10-23 NOTE — Telephone Encounter (Signed)
 Pt wife per agent is returning kendra call

## 2023-10-23 NOTE — Telephone Encounter (Signed)
 Left message to return phone call. Not sure who the listed name is below or any details with this message. Will call again later

## 2023-10-24 DIAGNOSIS — Z0279 Encounter for issue of other medical certificate: Secondary | ICD-10-CM

## 2023-10-24 NOTE — Telephone Encounter (Signed)
 Spoke to pt and he stated that he needed a handicap placard. Pt informed me that he have prostate cancer and arthritis so he would like the placard.

## 2023-10-24 NOTE — Telephone Encounter (Signed)
PPW placed on provider's desk.

## 2023-10-28 NOTE — Telephone Encounter (Signed)
 PPW filled out and placed in front office filing cabinet. Pt spouse made aware that there is a charge for the form. Spouse verbalized understanding.

## 2023-11-05 ENCOUNTER — Other Ambulatory Visit: Payer: Self-pay | Admitting: Adult Health

## 2023-11-05 DIAGNOSIS — I10 Essential (primary) hypertension: Secondary | ICD-10-CM

## 2023-11-09 ENCOUNTER — Other Ambulatory Visit

## 2023-11-27 NOTE — Progress Notes (Deleted)
 Gerald Johnson [address] [place] [address] Phone: [phone] Subjective:    I'Gerald seeing this patient by the request  of:  Gerald Huxley, NP  CC:   YEP:[id number]  09/16/2023 Patient given injection and tolerated the procedure well.  Discussed potentially getting x-rays of the contralateral knee with patient having some complaint about it at the end.  Discussed icing regimen of home exercises.  Increase activity slowly.  Follow-up with me again in 6 to 8 weeks otherwise.  Can repeat injection every 3 months if needed.  Does have a history of gout that needs to be continue to be monitored.      Update 12/08/2023 Gerald Johnson Gerald Johnson is a 63 y.o. male coming in with complaint of L knee pain. Patient states     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   Headache behind the eyes 07/03/2010   New problem noted 27 2012    Hemorrhoids    HYPERLIPIDEMIA 02/26/2007   Hypertension    Migraines    Sciatica 05/04/2008   SINUSITIS, RECURRENT 02/28/2010   Past Surgical History:  Procedure Laterality Date   COLONOSCOPY     WISDOM TOOTH EXTRACTION     Social History   Socioeconomic History   Marital status: Single    Spouse name: Not on file   Number of children: Not on file   Years of education: Not on file   Highest education level: Not on file  Occupational History   Not on file  Tobacco Use   Smoking status: Never   Smokeless tobacco: Never  Vaping Use   Vaping status: Never Used  Substance and Sexual Activity   Alcohol use: Yes    Alcohol/week: 7.0 standard drinks of alcohol    Types: 7 Cans of beer per week    Comment: soically   Drug use: No   Sexual activity: Yes    Birth control/protection: Condom  Other Topics Concern   Not on file  Social History Narrative   Works as a Data processing manager.    Not married    No kids    He likes to go to the gym. Likes to go to go to restaurants.    Social Drivers of Corporate investment banker Strain: Not on file  Food Insecurity: Not on file  Transportation Needs: Not on file  Physical Activity: Not on file  Stress: Not on file  Social Connections: Unknown (10/29/2022)   Received [facility]   Social Network    Social Network: Not on file   No Known Allergies Family History  Problem Relation Age of Onset   Diabetes Brother    Colon cancer Neg Hx    Esophageal cancer Neg Hx    Rectal cancer Neg Hx    Stomach cancer Neg Hx    Gout Neg Hx    Colon polyps Neg Hx      Current Outpatient Medications (Cardiovascular):    amLODipine  (NORVASC ) 5 MG tablet, TAKE 1 TABLET(5 MG) BY MOUTH DAILY   rosuvastatin  (CRESTOR ) 20 MG tablet, Take 1 tablet (20 mg total) by mouth daily.   sildenafil  (VIAGRA ) 50 MG tablet, Take 1 tablet (50 mg total) by mouth daily as needed for erectile dysfunction (take 1/2 to 1 pill as needed).  Current Outpatient  Medications (Respiratory):    albuterol  (VENTOLIN  HFA) 108 (90 Base) MCG/ACT inhaler, Inhale 2 puffs into the lungs every 4 (four) hours as needed for wheezing or shortness of breath.   cetirizine  (ZYRTEC ) 10 MG tablet, Take 10 mg by mouth as needed for allergies.   fluticasone  (FLONASE ) 50 MCG/ACT nasal spray, Place 2 sprays into both nostrils daily.  Current Outpatient Medications (Analgesics):    allopurinol (ZYLOPRIM) 300 MG tablet, Take 300 mg by mouth daily.   Aspirin-Acetaminophen -Caffeine  (EXCEDRIN PO), Take 1 tablet by mouth 2 (two) times daily as needed (headache).   colchicine  0.6 MG tablet, Take by mouth.   meloxicam  (MOBIC ) 15 MG tablet, Take 1 tablet (15 mg total) by mouth daily.   oxyCODONE -acetaminophen  (PERCOCET/ROXICET) 5-325 MG tablet, Take 1 tablet by mouth every 6 (six) hours as needed for severe pain (pain score 7-10).   SUMAtriptan  (IMITREX ) 50 MG tablet, TAKE 1  TABLET BY MOUTH EVERY 2 HOURS AS NEEDED FOR MIGRAINE.MAY REPEAT IN 2 HOURS IF HEADACHE PERSISTS OR RECURS.   Current Outpatient Medications (Other):    gabapentin  (NEURONTIN ) 300 MG capsule, Take 1 capsule (300 mg total) by mouth 3 (three) times daily.   metoCLOPramide  (REGLAN ) 10 MG tablet, Take 1 tablet (10 mg total) by mouth every 6 (six) hours. For nausea, headache.   NUBEQA 300 MG tablet, Take 600 mg by mouth 2 (two) times daily.   traZODone (DESYREL) 50 MG tablet, Take 1 tablet by mouth at bedtime.   triamcinolone  cream (KENALOG ) 0.1 %, Apply 1 Application topically 2 (two) times daily as needed.   Reviewed prior external information including notes and imaging from  primary care provider As well as notes that were available from care everywhere and other healthcare systems.  Past medical history, social, surgical and family history all reviewed in electronic medical record.  No pertanent information unless stated regarding to the chief complaint.   Review of Systems:  No headache, visual changes, nausea, vomiting, diarrhea, constipation, dizziness, abdominal pain, skin rash, fevers, chills, night sweats, weight loss, swollen lymph nodes, body aches, joint swelling, chest pain, shortness of breath, mood changes. POSITIVE muscle aches  Objective  There were no vitals taken for this visit.   General: No apparent distress alert and oriented x3 mood and affect normal, dressed appropriately.  HEENT: Pupils equal, extraocular movements intact  Respiratory: Patient's speak in full sentences and does not appear short of breath  Cardiovascular: No lower extremity edema, non tender, no erythema  Knee exam shows     Impression and Recommendations:    The above documentation has been reviewed and is accurate and complete Gerald Johnson Gerald [name], DO

## 2023-12-08 ENCOUNTER — Ambulatory Visit: Admitting: Family Medicine

## 2023-12-09 NOTE — Progress Notes (Unsigned)
 [facility] [address] [place] [address] Phone: [phone] Subjective:   Gerald Johnson, am serving as a scribe for Dr. Arthea Claudene.  I'm seeing this patient by the request  of:  Merna Huxley, NP  CC: Left knee pain  YEP:Dlagzrupcz  09/16/2023 Patient given injection and tolerated the procedure well.  Discussed potentially getting x-rays of the contralateral knee with patient having some complaint about it at the end.  Discussed icing regimen of home exercises.  Increase activity slowly.  Follow-up with me again in 6 to 8 weeks otherwise.  Can repeat injection every 3 months if needed.  Does have a history of gout that needs to be continue to be monitored.      Update 12/08/2023 Gerald Johnson is a 63 y.o. male coming in with complaint of L knee pain. Patient states that he is doing well. Knee pain has subsided. Swelling has gone down as well.     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   Headache behind the eyes 07/03/2010   New problem noted 27 2012    Hemorrhoids    HYPERLIPIDEMIA 02/26/2007   Hypertension    Migraines    Sciatica 05/04/2008   SINUSITIS, RECURRENT 02/28/2010   Past Surgical History:  Procedure Laterality Date   COLONOSCOPY     WISDOM TOOTH EXTRACTION     Social History   Socioeconomic History   Marital status: Single    Spouse name: Not on file   Number of children: Not on file   Years of education: Not on file   Highest education level: Not on file  Occupational History   Not on file  Tobacco Use   Smoking status: Never   Smokeless tobacco: Never  Vaping Use   Vaping status: Never Used  Substance and Sexual Activity   Alcohol use: Yes    Alcohol/week: 7.0 standard drinks of alcohol    Types: 7 Cans of beer per week    Comment: soically    Drug use: No   Sexual activity: Yes    Birth control/protection: Condom  Other Topics Concern   Not on file  Social History Narrative   Works as a Doctor, hospital.    Not married    No kids    He likes to go to the gym. Likes to go to go to restaurants.    Social Drivers of Corporate investment banker Strain: Not on file  Food Insecurity: Not on file  Transportation Needs: Not on file  Physical Activity: Not on file  Stress: Not on file  Social Connections: Unknown (10/29/2022)   Received from [facility]   Social Network    Social Network: Not on file   No Known Allergies Family History  Problem Relation Age of Onset   Diabetes Brother    Colon cancer Neg Hx    Esophageal cancer Neg Hx    Rectal cancer Neg Hx    Stomach cancer Neg Hx    Gout Neg Hx    Colon polyps Neg Hx      Current Outpatient Medications (Cardiovascular):    amLODipine  (NORVASC ) 5 MG tablet, TAKE 1 TABLET(5 MG) BY MOUTH DAILY   rosuvastatin  (CRESTOR ) 20 MG tablet, Take 1 tablet (20 mg total) by mouth daily.  sildenafil  (VIAGRA ) 50 MG tablet, Take 1 tablet (50 mg total) by mouth daily as needed for erectile dysfunction (take 1/2 to 1 pill as needed).  Current Outpatient Medications (Respiratory):    albuterol  (VENTOLIN  HFA) 108 (90 Base) MCG/ACT inhaler, Inhale 2 puffs into the lungs every 4 (four) hours as needed for wheezing or shortness of breath.   cetirizine  (ZYRTEC ) 10 MG tablet, Take 10 mg by mouth as needed for allergies.   fluticasone  (FLONASE ) 50 MCG/ACT nasal spray, Place 2 sprays into both nostrils daily.  Current Outpatient Medications (Analgesics):    allopurinol (ZYLOPRIM) 300 MG tablet, Take 300 mg by mouth daily.   Aspirin-Acetaminophen -Caffeine  (EXCEDRIN PO), Take 1 tablet by mouth 2 (two) times daily as needed (headache).   colchicine  0.6 MG tablet, Take by mouth.   meloxicam  (MOBIC ) 15 MG tablet, Take 1 tablet (15 mg total) by mouth daily.   oxyCODONE -acetaminophen   (PERCOCET/ROXICET) 5-325 MG tablet, Take 1 tablet by mouth every 6 (six) hours as needed for severe pain (pain score 7-10).   SUMAtriptan  (IMITREX ) 50 MG tablet, TAKE 1 TABLET BY MOUTH EVERY 2 HOURS AS NEEDED FOR MIGRAINE.MAY REPEAT IN 2 HOURS IF HEADACHE PERSISTS OR RECURS.   Current Outpatient Medications (Other):    gabapentin  (NEURONTIN ) 300 MG capsule, Take 1 capsule (300 mg total) by mouth 3 (three) times daily.   metoCLOPramide  (REGLAN ) 10 MG tablet, Take 1 tablet (10 mg total) by mouth every 6 (six) hours. For nausea, headache.   NUBEQA 300 MG tablet, Take 600 mg by mouth 2 (two) times daily.   traZODone (DESYREL) 50 MG tablet, Take 1 tablet by mouth at bedtime.   triamcinolone  cream (KENALOG ) 0.1 %, Apply 1 Application topically 2 (two) times daily as needed.   Objective  Blood pressure 118/84, pulse 68, height 6' 3 (1.905 m), weight 261 lb (118.4 kg), SpO2 99%.   General: No apparent distress alert and oriented x3 mood and affect normal, dressed appropriately.  HEENT: Pupils equal, extraocular movements intact  Respiratory: Patient's speak in full sentences and does not appear short of breath  Cardiovascular: No lower extremity edema, non tender, no erythema  Knee exam shows no significant swelling noted.  Crepitus noted.  Patient does have some mild instability with valgus and varus force.  Neurovascularly intact distally.  Able to change positions without any significant difficulty.    Impression and Recommendations:    The above documentation has been reviewed and is accurate and complete Maryanne Huneycutt M Auburn Hert, DO

## 2023-12-11 ENCOUNTER — Ambulatory Visit (INDEPENDENT_AMBULATORY_CARE_PROVIDER_SITE_OTHER): Admitting: Family Medicine

## 2023-12-11 VITALS — BP 118/84 | HR 68 | Ht 75.0 in | Wt 261.0 lb

## 2023-12-11 DIAGNOSIS — M1712 Unilateral primary osteoarthritis, left knee: Secondary | ICD-10-CM | POA: Diagnosis not present

## 2023-12-11 NOTE — Assessment & Plan Note (Signed)
 Doing significantly better at this time.  Discussed the possibility of repeating different types of treatment.  Discussed icing regimen of home exercises, discussed which activities to do and which ones to avoid.  Continue to work on weight and would like BMI to be under 30 at some point.  Patient is doing a good job.  Follow-up with his other providers see me again in 3 months if needed

## 2023-12-11 NOTE — Patient Instructions (Signed)
 Enjoy your trip See me in 3 months

## 2023-12-22 ENCOUNTER — Telehealth: Payer: Self-pay | Admitting: Family Medicine

## 2023-12-22 NOTE — Telephone Encounter (Unsigned)
 Copied from CRM [id number]. Topic: Clinical - Medical Advice >> Dec 22, 2023 11:09 AM Gennette ORN wrote: Patient's wife Annabella [phone] is calling because she is requesting a referral for the patient for an eye dr. She also wants to see what does Dr. Micheal suggests what should be done.

## 2023-12-24 ENCOUNTER — Ambulatory Visit (INDEPENDENT_AMBULATORY_CARE_PROVIDER_SITE_OTHER): Admitting: Family Medicine

## 2023-12-24 ENCOUNTER — Encounter: Payer: Self-pay | Admitting: Family Medicine

## 2023-12-24 VITALS — BP 118/76 | HR 95 | Temp 97.5°F | Wt 261.9 lb

## 2023-12-24 DIAGNOSIS — D2310 Other benign neoplasm of skin of unspecified eyelid, including canthus: Secondary | ICD-10-CM

## 2023-12-24 NOTE — Patient Instructions (Signed)
 I will be setting up ophthalmology referral.   Let me know in two weeks if you have not heard back.

## 2023-12-24 NOTE — Progress Notes (Signed)
   Established Patient Office Visit  Subjective   Patient ID: Gerald Johnson, male    DOB: March 25, 1961  Age: 63 y.o. MRN: [id number]  Chief Complaint  Patient presents with   Eye Problem    HPI   Gerald Johnson is seen with growth left lower eyelid.  First noted about a month ago.  Clear/translucent.  No pain.  No bleeding.  No visual changes.  He states he had a stye several years ago and wondered initially this may be a recurrent stye.  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 He has never used smokeless tobacco. He reports current alcohol use of about 7.0 standard drinks of alcohol per week. He reports that he does not use drugs. family history includes Diabetes in his brother. No Known Allergies  Review of Systems  Eyes:  Negative for blurred vision, pain, discharge and redness.      Objective:     BP 118/76 (BP Location: Left Arm, Patient Position: Sitting, Cuff Size: Large)   Pulse 95   Temp (!) 97.5 F (36.4 C) (Oral)   Wt 261 lb 14.4 oz (118.8 kg)   SpO2 95%   BMI 32.74 kg/m    Physical Exam Vitals reviewed.  Constitutional:      General: He is not in acute distress.    Appearance: He is not ill-appearing.  Eyes:     Comments: Conjunctival normal bilaterally.  Pupils equal round reactive to light.  Left lower lid reveals somewhat translucent cystic lesion approximately 2 to 3 mm diameter.  Well-demarcated borders.  Cardiovascular:     Rate and Rhythm: Normal rate and regular rhythm.   Neurological:     Mental Status: He is alert.      No results found for any visits on 12/24/23.    The 10-year ASCVD risk score (Arnett DK, et al., 2019) is: 13.1%    Assessment & Plan:   Problem List Items Addressed This Visit   None Visit Diagnoses       Hidrocystoma of eyelid, left    -  Primary     Patient has benign-appearing hidrocystoma left lower lid.  Reassurance this appears benign.  Patient would like to see the ophthalmologist to consider removal.  Will set up referral  No follow-ups on file.    Wolm Scarlet, MD

## 2023-12-24 NOTE — Telephone Encounter (Signed)
 Spoke to pt and he stated that he has a bump on his eye lid that has been bothering him. Tried to get pt scheduled with Darleene, pt is requesting to see Dr.Burchette and stated that he has been seeing Dr. Micheal for a while. Please advise.

## 2023-12-25 NOTE — Telephone Encounter (Signed)
 Left message to return phone call.

## 2024-01-01 NOTE — Telephone Encounter (Signed)
 Looks like pt had an appt. 12/24/2023. Closing note

## 2024-01-06 ENCOUNTER — Telehealth: Payer: Self-pay

## 2024-01-06 NOTE — Telephone Encounter (Deleted)
 Tried to call pt no answer. It looks like the referral has been sent to office below. The office will usually call you to schedule the appointment.    [facility] Address: [address] [place], [place], [place] [address]   Phone: [phone]

## 2024-01-06 NOTE — Telephone Encounter (Signed)
 Tried to call pt no answer. It looks like the referral has been sent to office below. The office will usually call you to schedule the appointment.    [facility] Address: [address] [place], [place], [place] [address]   Phone: [phone]

## 2024-01-06 NOTE — Telephone Encounter (Signed)
 Copied from CRM [id number]. Topic: Referral - Status >> Jan 06, 2024  7:58 AM Franky GRADE wrote: Reason for CRM: Patient's spouse Annabella was calling to follow up on a referral that was placed on 12/24/2023. Before I could provide information the call dropped. Attempted to call patient back but went to v/m. Left patient a message. >> Jan 06, 2024  8:06 AM Mia F wrote: Pt spouse is calling to follow up on the referral that was created for ophthalmology. She says it has been 2 weeks and she hasn't heard back from anyone. The referral has been put in but she says they were supposed to get a call with an appt for ophthalmology. Please advise

## 2024-01-06 NOTE — Telephone Encounter (Signed)
 Pt was giving the number to eye doctor.

## 2024-01-07 ENCOUNTER — Telehealth: Payer: Self-pay

## 2024-01-07 NOTE — Telephone Encounter (Signed)
 Pt stated that he called the number below but they advised that they do not have the referral for pt. I reached out to the referral coordinator for assistance. Pt advised that I will call him when I get an update. Pt verbalized understanding.

## 2024-01-07 NOTE — Telephone Encounter (Signed)
 Copied from CRM #[id number]. Topic: Referral - Status >> Jan 06, 2024  4:54 PM Shereese L wrote: Reason for CRM: patient is calling to verify the referral status of a referral for the ophthalmologist and needs a callback on the update of the referral

## 2024-02-12 ENCOUNTER — Other Ambulatory Visit: Payer: Self-pay | Admitting: Adult Health

## 2024-02-12 DIAGNOSIS — I10 Essential (primary) hypertension: Secondary | ICD-10-CM

## 2024-02-12 NOTE — Telephone Encounter (Signed)
 Patient need to schedule CPE for more refills.

## 2024-02-16 NOTE — Progress Notes (Unsigned)
 Ben Jackson D.[facility] [address] [place] [address] Phone: [phone]   Assessment and Plan:     1. Left wrist pain (Primary) -Acute, initial visit - Left dorsal sided wrist pain for 1 to 2 weeks without specific MOI.  Most consistent with irritation along wrist extensors, primarily compartments 2 through 5.  Suspect irritation caused by physical activity or sleeping position.  Gout included in differential, though no significant erythema or warmth and no improvement with colchicine . - Patient had leuprolide injection 1 to 2 days prior to pain starting.  While this medication could cause joint pain, joint pain would typically be broader while patient's pain today is specifically left dorsal sided wrist pain, so I doubt the leuprolide injection was significantly contributing to current symptoms - Start HEP to prevent stiffness - Recommend wrist brace without thumb spica for the next 2 weeks with gradual discontinuation of brace as tolerated - Start Celebrex  200 mg twice daily x2 weeks.  If still having pain after 2 weeks, complete 3rd-week of NSAID. May use remaining NSAID as needed once daily for pain control.  Do not to use additional over-the-counter NSAIDs (ibuprofen , naproxen, Advil , Aleve, etc.) while taking prescription NSAIDs.  May use Tylenol  [phone] mg 2 to 3 times a day for breakthrough pain.    15 additional minutes spent for educating Therapeutic Home Exercise Program.  This included exercises focusing on stretching, strengthening, with focus on eccentric aspects.   Long term goals include an improvement in range of motion, strength, endurance as well as avoiding reinjury. Patient's frequency would include in 1-2 times a day, 3-5 times a week for a duration of 6-12 weeks. Proper technique shown and discussed handout in great detail with ATC.  All questions were discussed and answered.    Pertinent previous records reviewed include  none   Follow Up: Patient has follow-up already scheduled with Dr. Claudene in 4 weeks.  Recommend keeping appointment.  If no improvement or worsening of symptoms, could consider physical therapy versus CSI   Subjective:   I, Deren Degrazia, am serving as a Neurosurgeon for Doctor Morene Mace  Chief Complaint: left hand and wrist pain   HPI:   02/17/2024 Patient is a 63 year old male with left hand and wrist pain. Patient states pain started about a week ago. No MOI. Received a shot and woke up the next day with wrist pain. Decreased ROM. No numbness or tingling. He was TTP. Wife notes swelling. No radiating pain. Tylenol  and ibu for the pain and that helps. Grip strength intact. Pain has been getting better. Thought it was gout but colchicine  did not help with symptoms.    Relevant Historical Information: Prostate cancer with metastasis to bone  Additional pertinent review of systems negative.   Current Outpatient Medications:    celecoxib  (CELEBREX ) 200 MG capsule, Take 1 capsule (200 mg total) by mouth 2 (two) times daily., Disp: 60 capsule, Rfl: 0   albuterol  (VENTOLIN  HFA) 108 (90 Base) MCG/ACT inhaler, Inhale 2 puffs into the lungs every 4 (four) hours as needed for wheezing or shortness of breath., Disp: 8 g, Rfl: 0   allopurinol (ZYLOPRIM) 300 MG tablet, Take 300 mg by mouth daily., Disp: , Rfl:    amLODipine  (NORVASC ) 5 MG tablet, TAKE 1 TABLET(5 MG) BY MOUTH DAILY, Disp: 90 tablet, Rfl: 0   Aspirin-Acetaminophen -Caffeine  (EXCEDRIN PO), Take 1 tablet by mouth 2 (two) times daily as needed (headache)., Disp: , Rfl:  cetirizine  (ZYRTEC ) 10 MG tablet, Take 10 mg by mouth as needed for allergies., Disp: , Rfl:    colchicine  0.6 MG tablet, Take by mouth., Disp: , Rfl:    fluticasone  (FLONASE ) 50 MCG/ACT nasal spray, Place 2 sprays into both nostrils daily., Disp: 16 g, Rfl: 6   gabapentin  (NEURONTIN ) 300 MG capsule, Take 1 capsule (300 mg total) by mouth 3 (three) times daily., Disp:  90 capsule, Rfl: 0   metoCLOPramide  (REGLAN ) 10 MG tablet, Take 1 tablet (10 mg total) by mouth every 6 (six) hours. For nausea, headache., Disp: 30 tablet, Rfl: 0   NUBEQA 300 MG tablet, Take 600 mg by mouth 2 (two) times daily., Disp: , Rfl:    oxyCODONE -acetaminophen  (PERCOCET/ROXICET) 5-325 MG tablet, Take 1 tablet by mouth every 6 (six) hours as needed for severe pain (pain score 7-10)., Disp: 4 tablet, Rfl: 0   rosuvastatin  (CRESTOR ) 20 MG tablet, Take 1 tablet (20 mg total) by mouth daily., Disp: 90 tablet, Rfl: 3   sildenafil  (VIAGRA ) 50 MG tablet, Take 1 tablet (50 mg total) by mouth daily as needed for erectile dysfunction (take 1/2 to 1 pill as needed)., Disp: 10 tablet, Rfl: 6   SUMAtriptan  (IMITREX ) 50 MG tablet, TAKE 1 TABLET BY MOUTH EVERY 2 HOURS AS NEEDED FOR MIGRAINE.MAY REPEAT IN 2 HOURS IF HEADACHE PERSISTS OR RECURS., Disp: 10 tablet, Rfl: 6   traZODone (DESYREL) 50 MG tablet, Take 1 tablet by mouth at bedtime., Disp: , Rfl:    triamcinolone  cream (KENALOG ) 0.1 %, Apply 1 Application topically 2 (two) times daily as needed., Disp: 30 g, Rfl: 1   Objective:     Vitals:   02/17/24 1312  BP: 108/68  Pulse: 81  SpO2: 96%  Weight: 264 lb (119.7 kg)  Height: 6' 3 (1.905 m)      Body mass index is 33 kg/m.    Physical Exam:    General: Appears well, nad, nontoxic and pleasant Neuro:sensation intact, strength is 5/5 in upper extremities, muscle tone wnl Skin:no susupicious lesions or rashes  Left hand/Wrist:   No deformity  appreciated. Mild dorsal wrist swelling ROM  Ext 60 with pain, flexion 70, radial/ulnar deviation 30 Dorsal carpals, dorsal wrist, dorsal distal forearm nttp over the snuff box,   volar carpals, radial styloid, ulnar styloid, 1st mcp, tfcc Negative Tinel's, Phalen's, Prayer Tests Negative finklestein Neg tfcc bounce test No pain with resisted  flex or deviation    Electronically signed by:  Odis Mace D.[facility] 1:48 PM 02/17/24

## 2024-02-17 ENCOUNTER — Ambulatory Visit (INDEPENDENT_AMBULATORY_CARE_PROVIDER_SITE_OTHER): Admitting: Sports Medicine

## 2024-02-17 VITALS — BP 108/68 | HR 81 | Ht 75.0 in | Wt 264.0 lb

## 2024-02-17 DIAGNOSIS — M25532 Pain in left wrist: Secondary | ICD-10-CM | POA: Diagnosis not present

## 2024-02-17 MED ORDER — CELECOXIB 200 MG PO CAPS: 200.0000 mg | ORAL_CAPSULE | Freq: Two times a day (BID) | ORAL | 0 refills | Status: AC

## 2024-02-17 NOTE — Patient Instructions (Addendum)
-   Start Celebrex  200 mg  x2 daily.  If still having pain after 2 weeks, complete 3rd-week of NSAID. May use remaining NSAID as needed once daily for pain control.  Do not to use additional over-the-counter NSAIDs (ibuprofen , naproxen, Advil , Aleve, etc.) while taking prescription NSAIDs.  May use Tylenol  [phone] mg 2 to 3 times a day for breakthrough pain.   Wrist brace   Wrist HEP   Keep follow up with Dr. Claudene

## 2024-03-09 NOTE — Progress Notes (Unsigned)
 [facility] [address] [place] [address] Phone: [phone] Subjective:   ISusannah Johnson, am serving as a scribe for Dr. Arthea Claudene.  I'm seeing this patient by the request  of:  Merna Huxley, NP  CC: Left knee pain follow-up  YEP:Dlagzrupcz  12/11/2023 Doing significantly better at this time.  Discussed the possibility of repeating different types of treatment.  Discussed icing regimen of home exercises, discussed which activities to do and which ones to avoid.  Continue to work on weight and would like BMI to be under 30 at some point.  Patient is doing a good job.  Follow-up with his other providers see me again in 3 months if needed     Update 03/10/2024 Gerald Johnson is a 63 y.o. male coming in with complaint of L knee pain. Saw Dr. Leonce for L wrist pain on 02/17/2024. Patient states knee causing some discomfort.       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   Headache behind the eyes 07/03/2010   New problem noted 27 2012    Hemorrhoids    HYPERLIPIDEMIA 02/26/2007   Hypertension    Migraines    Sciatica 05/04/2008   SINUSITIS, RECURRENT 02/28/2010   Past Surgical History:  Procedure Laterality Date   COLONOSCOPY     WISDOM TOOTH EXTRACTION     Social History   Socioeconomic History   Marital status: Single    Spouse name: Not on file   Number of children: Not on file   Years of education: Not on file   Highest education level: Not on file  Occupational History   Not on file  Tobacco Use   Smoking status: Never   Smokeless tobacco: Never  Vaping Use   Vaping status: Never Used  Substance and Sexual Activity   Alcohol use: Yes    Alcohol/week: 7.0 standard drinks of alcohol    Types: 7 Cans of beer per week    Comment: soically   Drug use: No   Sexual  activity: Yes    Birth control/protection: Condom  Other Topics Concern   Not on file  Social History Narrative   Works as a Doctor, hospital.    Not married    No kids    He likes to go to the gym. Likes to go to go to restaurants.    Social Drivers of Corporate investment banker Strain: Not on file  Food Insecurity: Not on file  Transportation Needs: Not on file  Physical Activity: Not on file  Stress: Not on file  Social Connections: Unknown (10/29/2022)   Received from [facility]   Social Network    Social Network: Not on file   No Known Allergies Family History  Problem Relation Age of Onset   Diabetes Brother    Colon cancer Neg Hx    Esophageal cancer Neg Hx    Rectal cancer Neg Hx    Stomach cancer Neg Hx    Gout Neg Hx    Colon polyps Neg Hx      Current Outpatient Medications (Cardiovascular):    amLODipine  (NORVASC ) 5 MG tablet, TAKE 1 TABLET(5 MG) BY MOUTH DAILY   rosuvastatin  (CRESTOR ) 20 MG tablet, Take 1 tablet (20 mg total) by mouth daily.   sildenafil  (VIAGRA )  50 MG tablet, Take 1 tablet (50 mg total) by mouth daily as needed for erectile dysfunction (take 1/2 to 1 pill as needed).  Current Outpatient Medications (Respiratory):    albuterol  (VENTOLIN  HFA) 108 (90 Base) MCG/ACT inhaler, Inhale 2 puffs into the lungs every 4 (four) hours as needed for wheezing or shortness of breath.   cetirizine  (ZYRTEC ) 10 MG tablet, Take 10 mg by mouth as needed for allergies.   fluticasone  (FLONASE ) 50 MCG/ACT nasal spray, Place 2 sprays into both nostrils daily.  Current Outpatient Medications (Analgesics):    allopurinol (ZYLOPRIM) 300 MG tablet, Take 300 mg by mouth daily.   Aspirin-Acetaminophen -Caffeine  (EXCEDRIN PO), Take 1 tablet by mouth 2 (two) times daily as needed (headache).   celecoxib  (CELEBREX ) 200 MG capsule, Take 1 capsule (200 mg total) by mouth 2 (two) times daily.   colchicine  0.6 MG tablet, Take by mouth.   oxyCODONE -acetaminophen   (PERCOCET/ROXICET) 5-325 MG tablet, Take 1 tablet by mouth every 6 (six) hours as needed for severe pain (pain score 7-10).   SUMAtriptan  (IMITREX ) 50 MG tablet, TAKE 1 TABLET BY MOUTH EVERY 2 HOURS AS NEEDED FOR MIGRAINE.MAY REPEAT IN 2 HOURS IF HEADACHE PERSISTS OR RECURS.   Current Outpatient Medications (Other):    gabapentin  (NEURONTIN ) 300 MG capsule, Take 1 capsule (300 mg total) by mouth 3 (three) times daily.   metoCLOPramide  (REGLAN ) 10 MG tablet, Take 1 tablet (10 mg total) by mouth every 6 (six) hours. For nausea, headache.   NUBEQA 300 MG tablet, Take 600 mg by mouth 2 (two) times daily.   traZODone (DESYREL) 50 MG tablet, Take 1 tablet by mouth at bedtime.   triamcinolone  cream (KENALOG ) 0.1 %, Apply 1 Application topically 2 (two) times daily as needed.    Objective  Blood pressure 106/82, pulse 84, height 6' 3 (1.905 m), weight 264 lb (119.7 kg), SpO2 96%.   General: No apparent distress alert and oriented x3 mood and affect normal, dressed appropriately.  HEENT: Pupils equal, extraocular movements intact  Respiratory: Patient's speak in full sentences and does not appear short of breath  Cardiovascular: No lower extremity edema, non tender, no erythema  Left knee does have arthritic changes noted.  Trace effusion noted.  Limited range of motion with flexion.  Mild crepitus noted.    Impression and Recommendations:    The above documentation has been reviewed and is accurate and complete Aramis Zobel M Lulani Bour, DO

## 2024-03-10 ENCOUNTER — Ambulatory Visit: Admitting: Family Medicine

## 2024-03-10 ENCOUNTER — Encounter: Payer: Self-pay | Admitting: Family Medicine

## 2024-03-10 VITALS — BP 106/82 | HR 84 | Ht 75.0 in | Wt 264.0 lb

## 2024-03-10 DIAGNOSIS — M1712 Unilateral primary osteoarthritis, left knee: Secondary | ICD-10-CM

## 2024-03-10 NOTE — Patient Instructions (Signed)
 [place] for wrist when lifting Check knee one more time See you again in 2-3 months

## 2024-03-10 NOTE — Assessment & Plan Note (Signed)
 Discussed with patient again about the potential for aspiration and injection.  Patient is feeling good overall.  Will hold on any other type of intervention at the moment.  Would like to see patient back 1 more time in 3 months just to make sure patient continues to respond.  Follow-up again as stated

## 2024-04-30 ENCOUNTER — Ambulatory Visit: Admitting: Sports Medicine

## 2024-04-30 VITALS — BP 108/82 | HR 69 | Ht 75.0 in | Wt 262.0 lb

## 2024-04-30 DIAGNOSIS — G8929 Other chronic pain: Secondary | ICD-10-CM

## 2024-04-30 DIAGNOSIS — M25562 Pain in left knee: Secondary | ICD-10-CM

## 2024-04-30 DIAGNOSIS — M1712 Unilateral primary osteoarthritis, left knee: Secondary | ICD-10-CM

## 2024-04-30 NOTE — Progress Notes (Signed)
 Gerald Ariell Gunnels D.[facility] [address] [place] [address] Phone: [phone]   Assessment and Plan:     1. Primary osteoarthritis of left knee (Primary) 2. Chronic pain of left knee -Chronic with exacerbation, subsequent visit - Consistent with recurrent flare of left knee osteoarthritis - Patient has received relief with intra-articular CSI in the past with last injection on 09/16/2023.  Elected for repeat CSI today.  Tolerated well per note below. - Patient recently completed Celebrex  course within the past 3 months, so do not recommend repeat NSAID course at this time.  May continue to use Celebrex  200 mg daily as needed limiting chronic NSAIDs to 1-2 doses per week to prevent long-term side effects   Procedure: Knee Joint Injection Side: Left Indication: Flare of osteoarthritis  Risks explained and consent was given verbally. The site was cleaned with alcohol prep. A needle was introduced with an anterio-lateral approach. Injection given using 2mL of 1% lidocaine without epinephrine and 1mL of kenalog  40mg /ml. This was well tolerated.  Needle was removed, hemostasis achieved, and post injection instructions were explained.   Pt was advised to call or return to clinic if these symptoms worsen or fail to improve as anticipated.   Pertinent previous records reviewed include none   Follow Up: As needed if no improvement or worsening of symptoms.  Could consider repeat CSI   Subjective:   I, Moenique Parris, am serving as a neurosurgeon for Doctor Morene Mace  Chief Complaint: left knee pain   HPI:  12/11/2023 Doing significantly better at this time.  Discussed the possibility of repeating different types of treatment.  Discussed icing regimen of home exercises, discussed which activities to do and which ones to avoid.  Continue to work on weight and would like BMI to be under 30 at some point.  Patient is doing a good job.  Follow-up with his  other providers see me again in 3 months if needed    Update 03/10/2024 Gerald Johnson ORN Lanzo is a 63 y.o. male coming in with complaint of L knee pain. Saw Dr. Mace for L wrist pain on 02/17/2024. Patient states knee causing some discomfort.    04/30/24 Patient is a 63 year old male with left knee pain. Patient states wife states 8/10 pain wants to discuss gel injection.   Relevant Historical Information: History of prostate cancer  Additional pertinent review of systems negative.   Current Outpatient Medications:    albuterol  (VENTOLIN  HFA) 108 (90 Base) MCG/ACT inhaler, Inhale 2 puffs into the lungs every 4 (four) hours as needed for wheezing or shortness of breath., Disp: 8 g, Rfl: 0   allopurinol (ZYLOPRIM) 300 MG tablet, Take 300 mg by mouth daily., Disp: , Rfl:    amLODipine  (NORVASC ) 5 MG tablet, TAKE 1 TABLET(5 MG) BY MOUTH DAILY, Disp: 90 tablet, Rfl: 0   Aspirin-Acetaminophen -Caffeine  (EXCEDRIN PO), Take 1 tablet by mouth 2 (two) times daily as needed (headache)., Disp: , Rfl:    celecoxib  (CELEBREX ) 200 MG capsule, Take 1 capsule (200 mg total) by mouth 2 (two) times daily., Disp: 60 capsule, Rfl: 0   cetirizine  (ZYRTEC ) 10 MG tablet, Take 10 mg by mouth as needed for allergies., Disp: , Rfl:    colchicine  0.6 MG tablet, Take by mouth., Disp: , Rfl:    fluticasone  (FLONASE ) 50 MCG/ACT nasal spray, Place 2 sprays into both nostrils daily., Disp: 16 g, Rfl: 6   gabapentin  (NEURONTIN ) 300 MG capsule, Take 1 capsule (  300 mg total) by mouth 3 (three) times daily., Disp: 90 capsule, Rfl: 0   metoCLOPramide  (REGLAN ) 10 MG tablet, Take 1 tablet (10 mg total) by mouth every 6 (six) hours. For nausea, headache., Disp: 30 tablet, Rfl: 0   NUBEQA 300 MG tablet, Take 600 mg by mouth 2 (two) times daily., Disp: , Rfl:    oxyCODONE -acetaminophen  (PERCOCET/ROXICET) 5-325 MG tablet, Take 1 tablet by mouth every 6 (six) hours as needed for severe pain (pain score 7-10)., Disp: 4 tablet, Rfl: 0    rosuvastatin  (CRESTOR ) 20 MG tablet, Take 1 tablet (20 mg total) by mouth daily., Disp: 90 tablet, Rfl: 3   sildenafil  (VIAGRA ) 50 MG tablet, Take 1 tablet (50 mg total) by mouth daily as needed for erectile dysfunction (take 1/2 to 1 pill as needed)., Disp: 10 tablet, Rfl: 6   SUMAtriptan  (IMITREX ) 50 MG tablet, TAKE 1 TABLET BY MOUTH EVERY 2 HOURS AS NEEDED FOR MIGRAINE.MAY REPEAT IN 2 HOURS IF HEADACHE PERSISTS OR RECURS., Disp: 10 tablet, Rfl: 6   traZODone (DESYREL) 50 MG tablet, Take 1 tablet by mouth at bedtime., Disp: , Rfl:    triamcinolone  cream (KENALOG ) 0.1 %, Apply 1 Application topically 2 (two) times daily as needed., Disp: 30 g, Rfl: 1   Objective:     Vitals:   04/30/24 1356  BP: 108/82  Pulse: 69  SpO2: 99%  Weight: 262 lb (118.8 kg)  Height: 6' 3 (1.905 m)      Body mass index is 32.75 kg/m.    Physical Exam:    General:  awake, alert oriented, no acute distress nontoxic Skin: no suspicious lesions or rashes Neuro:sensation intact and strength 5/5 with no deficits, no atrophy, normal muscle tone Psych: No signs of anxiety, depression or other mood disorder  Left knee: Mild swelling No deformity Positive fluid wave, joint milking ROM Flex 95, Ext 0 TTP medial joint line, medial femoral condyle, patella NTTP over the quad tendon,   lat fem condyle,  , patella tendon, tibial tuberostiy, fibular head, posterior fossa, pes anserine bursa, gerdy's tubercle,  lateral jt line Neg anterior and posterior drawer  Gait antalgic, favoring right leg   Electronically signed by:  Odis Mace D.[facility] 2:05 PM 04/30/24

## 2024-05-03 ENCOUNTER — Ambulatory Visit: Admitting: Sports Medicine

## 2024-05-07 ENCOUNTER — Other Ambulatory Visit: Payer: Self-pay | Admitting: Adult Health

## 2024-05-07 DIAGNOSIS — I10 Essential (primary) hypertension: Secondary | ICD-10-CM

## 2024-05-08 ENCOUNTER — Other Ambulatory Visit: Payer: Self-pay | Admitting: Sports Medicine

## 2024-06-23 ENCOUNTER — Encounter: Admitting: Adult Health
